# Patient Record
Sex: Male | Born: 1955 | Race: Black or African American | Hispanic: No | Marital: Single | Smoking: Current every day smoker
Health system: Southern US, Community
[De-identification: ages and names within clinical notes are randomized; demographics above are authoritative.]

## PROBLEM LIST (undated history)

## (undated) DIAGNOSIS — R918 Other nonspecific abnormal finding of lung field: Secondary | ICD-10-CM

## (undated) DIAGNOSIS — Z923 Personal history of irradiation: Secondary | ICD-10-CM

## (undated) DIAGNOSIS — K259 Gastric ulcer, unspecified as acute or chronic, without hemorrhage or perforation: Secondary | ICD-10-CM

## (undated) DIAGNOSIS — F1910 Other psychoactive substance abuse, uncomplicated: Secondary | ICD-10-CM

## (undated) DIAGNOSIS — C799 Secondary malignant neoplasm of unspecified site: Secondary | ICD-10-CM

## (undated) HISTORY — DX: Secondary malignant neoplasm of unspecified site: C79.9

## (undated) HISTORY — DX: Personal history of irradiation: Z92.3

---

## 2003-09-23 ENCOUNTER — Inpatient Hospital Stay (HOSPITAL_COMMUNITY): Admission: AD | Admit: 2003-09-23 | Discharge: 2003-09-27 | Payer: Self-pay

## 2004-06-16 ENCOUNTER — Emergency Department (HOSPITAL_COMMUNITY): Admission: EM | Admit: 2004-06-16 | Discharge: 2004-06-16 | Payer: Self-pay | Admitting: Family Medicine

## 2007-06-12 ENCOUNTER — Inpatient Hospital Stay (HOSPITAL_COMMUNITY): Admission: EM | Admit: 2007-06-12 | Discharge: 2007-06-13 | Payer: Self-pay | Admitting: Emergency Medicine

## 2007-06-12 ENCOUNTER — Encounter: Payer: Self-pay | Admitting: Emergency Medicine

## 2007-07-01 ENCOUNTER — Emergency Department (HOSPITAL_COMMUNITY): Admission: EM | Admit: 2007-07-01 | Discharge: 2007-07-01 | Payer: Self-pay | Admitting: Emergency Medicine

## 2009-12-04 ENCOUNTER — Emergency Department (HOSPITAL_COMMUNITY): Admission: EM | Admit: 2009-12-04 | Discharge: 2009-12-04 | Payer: Self-pay | Admitting: Emergency Medicine

## 2009-12-24 ENCOUNTER — Emergency Department (HOSPITAL_COMMUNITY): Admission: EM | Admit: 2009-12-24 | Discharge: 2009-12-24 | Payer: Self-pay | Admitting: Emergency Medicine
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
## 2011-04-27 NOTE — H&P (Signed)
   NAME:  [name], [name]                              ACCOUNT NO.:  [id number]   MEDICAL RECORD NO.:  [id number]                   PATIENT TYPE:  INP   LOCATION:  5703                                 FACILITY:  [facility]   PHYSICIAN:  Thornton Park. Daphine Deutscher, M.D.             DATE OF BIRTH:  October 25, 1956   DATE OF ADMISSION:  09/22/2003  DATE OF DISCHARGE:                                HISTORY & PHYSICAL   CHIEF COMPLAINT:  Severe abdominal pain.   HISTORY:  Philip Andrews is a 55 year old black male who presented to the  emergency department with an acute abdomen.  His CT scan showed free air and  antral thickening.  He was seen by me in the emergency department at  approximately midnight and was taken to the operating room for laparotomy.  This procedure was deemed an emergency, although I discussed this with his  daughter.  Informed consent had to be waived because he was intoxicated.   ALLERGIES:  The patient had no known allergies.   MEDICATIONS:  The patient took no medications.   PAST MEDICAL HISTORY:  No one accompanied the patient for past history,  family history, review of systems.   PHYSICAL EXAMINATION:  VITAL SIGNS:  Blood pressure 88 systolic.  He was  afebrile.  GENERAL:  He was a thin black male with an acute abdomen who had been given  some narcotics for pain.  HEENT:  Unremarkable.  NECK:  Supple and thin.  CHEST:  Breath sounds are equal bilaterally.  HEART:  Sinus rhythm.  ABDOMEN:  Thin and exquisitely tender.  GENITOURINARY:  Unremarkable.  EXTREMITIES:  Full range of motion.   IMPRESSION:  Perforated peptic ulcer.   PLAN:  To OR for laparotomy.                                                Thornton Park Daphine Deutscher, M.D.    MBM/MEDQ  D:  09/27/2003  T:  09/27/2003  Job:  [id number]

## 2011-04-27 NOTE — Discharge Summary (Signed)
   NAME:  LATHAM, KINZLER                              ACCOUNT NO.:  [id number]   MEDICAL RECORD NO.:  [id number]                   PATIENT TYPE:  INP   LOCATION:  5703                                 FACILITY:  [facility]   PHYSICIAN:  Thornton Park. Daphine Deutscher, M.D.             DATE OF BIRTH:  January 19, 1956   DATE OF ADMISSION:  09/22/2003  DATE OF DISCHARGE:  09/27/2003                                 DISCHARGE SUMMARY   ADMITTING DIAGNOSES:  Perforated peptic ulcer.   PROCEDURE:  Exploratory laparotomy, Cheree Ditto patch closure perforated channel  ulcer.   HOSPITAL COURSE:  Singleton Hickox was admitted through the ED, taken straight to  the OR where he underwent Cheree Ditto patch closure of perforated pyloric  channel/duodenal ulcer.  Postoperatively he did well.  He continued to sneak  downstairs and smoke despite trying to put a Nicoderm patch on him.  By  postoperative day three he was sneaking downstairs, eating regular foods,  and so on.  Postoperative day four we felt he was probably ready to go.  He  was eager to go.  His incision looked fine.  He was given a prescription for  a Prev-Pak for empiric treatment of H. pylori and a prescription for Vicodin  for pain.   CONDITION ON DISCHARGE:  Improved.  He is instructed to return to the office  in about three weeks for follow-up.   FINAL DIAGNOSES:  Perforated duodenal ulcer status post repair.                                                Thornton Park Daphine Deutscher, M.D.    MBM/MEDQ  D:  09/27/2003  T:  09/27/2003  Job:  [id number]

## 2011-04-27 NOTE — Op Note (Signed)
   NAME:  Philip Andrews, Philip Andrews                              ACCOUNT NO.:  [id number]   MEDICAL RECORD NO.:  [id number]                   PATIENT TYPE:  INP   LOCATION:  1828                                 FACILITY:  [facility]   PHYSICIAN:  Thornton Park. Daphine Deutscher, M.D.             DATE OF BIRTH:  11/25/1956   DATE OF PROCEDURE:  09/23/2003  DATE OF DISCHARGE:                                 OPERATIVE REPORT   PREOPERATIVE DIAGNOSIS:  Free air perforated ulcer.   POSTOPERATIVE DIAGNOSIS:  Free air perforated ulcer.   PROCEDURE:  Exploratory lap, Graham patch closure, perforated channel ulcer.   SURGEON:  Thornton Park. Daphine Deutscher, M.D.   ANESTHESIA:  General endotracheal.   DESCRIPTION OF PROCEDURE:  Philip Andrews is a 55 year old gentleman who  presented to the ER with an acute abdomen.  CT scan showed free air and the  extravasation of some contrast.   The patient was taken to room 16 and given general anesthesia.  The abdomen  was prepped with Betadine and draped sterilely.  A small midline incision  was made right over what appeared to be probably a pyloric channel ulcer.  This was oversewn with four sutures of 2-0 silk and approximated to close  the hole.  I then used the sutures to tie down an omental tongue which lay  up on top of the perforation, and this was tied down.  The abdomen was then  irrigated with several liters of saline.  The irrigant was clear.  The  fascia was closed with a running #1 PDS from above and below.  The wound was  irrigated and the skin was closed with a stapler.   The patient tolerated the procedure well and was taken to the recovery room  in satisfactory condition.                                               Thornton Park Daphine Deutscher, M.D.    MBM/MEDQ  D:  09/23/2003  T:  09/23/2003  Job:  [id number]
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
## 2011-09-25 LAB — DIFFERENTIAL
Band Neutrophils: 0
Basophils Absolute: 0
Basophils Relative: 0
Eosinophils Absolute: 0
Eosinophils Relative: 0
Metamyelocytes Relative: 0
Myelocytes: 0

## 2011-09-25 LAB — URINE MICROSCOPIC-ADD ON

## 2011-09-25 LAB — BASIC METABOLIC PANEL
BUN: 8
BUN: 12
Chloride: 104
Chloride: 107
Glucose, Bld: 89
Potassium: 4
Potassium: 3.5

## 2011-09-25 LAB — CBC
HCT: 39.5
HCT: 39.7
Hemoglobin: 13.9
MCV: 91.4
MCV: 92.8
Platelets: 297
Platelets: 294
RDW: 13.7
WBC: 9.6
WBC: 11.1 — ABNORMAL HIGH

## 2011-09-25 LAB — URINALYSIS, ROUTINE W REFLEX MICROSCOPIC
Nitrite: NEGATIVE
Protein, ur: 100 — AB
Specific Gravity, Urine: >1.03 — ABNORMAL HIGH
Urobilinogen, UA: 0.2

## 2011-09-25 LAB — ETHANOL: Alcohol, Ethyl (B): 149 — ABNORMAL HIGH

## 2011-09-25 LAB — RAPID URINE DRUG SCREEN, HOSP PERFORMED: Tetrahydrocannabinol: NOT DETECTED

## 2013-10-24 ENCOUNTER — Inpatient Hospital Stay (HOSPITAL_COMMUNITY)
Admission: EM | Admit: 2013-10-24 | Discharge: 2013-11-03 | DRG: 237 | Disposition: A | Discharge status: Home / Self Care | Payer: Medicaid Other | Attending: Internal Medicine | Admitting: Internal Medicine

## 2013-10-24 ENCOUNTER — Encounter (HOSPITAL_COMMUNITY)
Admission: EM | Disposition: A | Discharge status: Home / Self Care | Payer: Self-pay | Source: Home / Self Care | Attending: Internal Medicine

## 2013-10-24 ENCOUNTER — Emergency Department (HOSPITAL_COMMUNITY): Payer: Medicaid Other

## 2013-10-24 ENCOUNTER — Encounter (HOSPITAL_COMMUNITY): Payer: Self-pay | Admitting: Emergency Medicine

## 2013-10-24 DIAGNOSIS — G92 Toxic encephalopathy: Secondary | ICD-10-CM | POA: Diagnosis not present

## 2013-10-24 DIAGNOSIS — K7200 Acute and subacute hepatic failure without coma: Secondary | ICD-10-CM | POA: Diagnosis not present

## 2013-10-24 DIAGNOSIS — F1410 Cocaine abuse, uncomplicated: Secondary | ICD-10-CM | POA: Diagnosis present

## 2013-10-24 DIAGNOSIS — F329 Major depressive disorder, single episode, unspecified: Secondary | ICD-10-CM | POA: Diagnosis present

## 2013-10-24 DIAGNOSIS — I319 Disease of pericardium, unspecified: Secondary | ICD-10-CM

## 2013-10-24 DIAGNOSIS — F17200 Nicotine dependence, unspecified, uncomplicated: Secondary | ICD-10-CM | POA: Diagnosis present

## 2013-10-24 DIAGNOSIS — I079 Rheumatic tricuspid valve disease, unspecified: Secondary | ICD-10-CM | POA: Diagnosis present

## 2013-10-24 DIAGNOSIS — R579 Shock, unspecified: Secondary | ICD-10-CM | POA: Diagnosis present

## 2013-10-24 DIAGNOSIS — Z6379 Other stressful life events affecting family and household: Secondary | ICD-10-CM

## 2013-10-24 DIAGNOSIS — J439 Emphysema, unspecified: Secondary | ICD-10-CM

## 2013-10-24 DIAGNOSIS — J9601 Acute respiratory failure with hypoxia: Secondary | ICD-10-CM | POA: Diagnosis present

## 2013-10-24 DIAGNOSIS — G929 Unspecified toxic encephalopathy: Secondary | ICD-10-CM | POA: Diagnosis not present

## 2013-10-24 DIAGNOSIS — J438 Other emphysema: Secondary | ICD-10-CM | POA: Diagnosis present

## 2013-10-24 DIAGNOSIS — Z79899 Other long term (current) drug therapy: Secondary | ICD-10-CM

## 2013-10-24 DIAGNOSIS — F05 Delirium due to known physiological condition: Secondary | ICD-10-CM | POA: Diagnosis not present

## 2013-10-24 DIAGNOSIS — R64 Cachexia: Secondary | ICD-10-CM | POA: Diagnosis present

## 2013-10-24 DIAGNOSIS — R51 Headache: Secondary | ICD-10-CM

## 2013-10-24 DIAGNOSIS — I313 Pericardial effusion (noninflammatory): Secondary | ICD-10-CM

## 2013-10-24 DIAGNOSIS — J9690 Respiratory failure, unspecified, unspecified whether with hypoxia or hypercapnia: Secondary | ICD-10-CM

## 2013-10-24 DIAGNOSIS — R918 Other nonspecific abnormal finding of lung field: Secondary | ICD-10-CM | POA: Diagnosis present

## 2013-10-24 DIAGNOSIS — Z8249 Family history of ischemic heart disease and other diseases of the circulatory system: Secondary | ICD-10-CM

## 2013-10-24 DIAGNOSIS — R4181 Age-related cognitive decline: Secondary | ICD-10-CM | POA: Diagnosis present

## 2013-10-24 DIAGNOSIS — R570 Cardiogenic shock: Secondary | ICD-10-CM

## 2013-10-24 DIAGNOSIS — N179 Acute kidney failure, unspecified: Secondary | ICD-10-CM

## 2013-10-24 DIAGNOSIS — C3490 Malignant neoplasm of unspecified part of unspecified bronchus or lung: Secondary | ICD-10-CM | POA: Diagnosis present

## 2013-10-24 DIAGNOSIS — Z800 Family history of malignant neoplasm of digestive organs: Secondary | ICD-10-CM

## 2013-10-24 DIAGNOSIS — J9600 Acute respiratory failure, unspecified whether with hypoxia or hypercapnia: Secondary | ICD-10-CM | POA: Diagnosis present

## 2013-10-24 DIAGNOSIS — Z87820 Personal history of traumatic brain injury: Secondary | ICD-10-CM

## 2013-10-24 DIAGNOSIS — I3139 Other pericardial effusion (noninflammatory): Secondary | ICD-10-CM

## 2013-10-24 DIAGNOSIS — J189 Pneumonia, unspecified organism: Secondary | ICD-10-CM | POA: Diagnosis present

## 2013-10-24 DIAGNOSIS — I318 Other specified diseases of pericardium: Principal | ICD-10-CM | POA: Diagnosis present

## 2013-10-24 DIAGNOSIS — F3289 Other specified depressive episodes: Secondary | ICD-10-CM | POA: Diagnosis present

## 2013-10-24 DIAGNOSIS — R7401 Elevation of levels of liver transaminase levels: Secondary | ICD-10-CM

## 2013-10-24 DIAGNOSIS — J90 Pleural effusion, not elsewhere classified: Secondary | ICD-10-CM | POA: Diagnosis present

## 2013-10-24 DIAGNOSIS — Z8673 Personal history of transient ischemic attack (TIA), and cerebral infarction without residual deficits: Secondary | ICD-10-CM

## 2013-10-24 DIAGNOSIS — D638 Anemia in other chronic diseases classified elsewhere: Secondary | ICD-10-CM | POA: Diagnosis present

## 2013-10-24 DIAGNOSIS — C3400 Malignant neoplasm of unspecified main bronchus: Secondary | ICD-10-CM | POA: Diagnosis present

## 2013-10-24 DIAGNOSIS — I314 Cardiac tamponade: Secondary | ICD-10-CM

## 2013-10-24 DIAGNOSIS — C50919 Malignant neoplasm of unspecified site of unspecified female breast: Secondary | ICD-10-CM | POA: Diagnosis present

## 2013-10-24 HISTORY — DX: Other psychoactive substance abuse, uncomplicated: F19.10

## 2013-10-24 LAB — APTT
aPTT: 67 seconds — ABNORMAL HIGH (ref 24–37)
aPTT: 45 seconds — ABNORMAL HIGH (ref 24–37)

## 2013-10-24 LAB — POCT I-STAT, CHEM 8
Calcium, Ion: 0.6 mmol/L — CL (ref 1.12–1.23)
Calcium, Ion: 0.97 mmol/L — ABNORMAL LOW (ref 1.12–1.23)
Chloride: 98 mEq/L (ref 96–112)
Creatinine, Ser: 1.8 mg/dL — ABNORMAL HIGH (ref 0.50–1.35)
Glucose, Bld: 51 mg/dL — ABNORMAL LOW (ref 70–99)
HCT: 38 % — ABNORMAL LOW (ref 39.0–52.0)
Hemoglobin: 6.1 g/dL — CL (ref 13.0–17.0)
Hemoglobin: 12.9 g/dL — ABNORMAL LOW (ref 13.0–17.0)
Sodium: 114 mEq/L — CL (ref 135–145)
Sodium: 129 mEq/L — ABNORMAL LOW (ref 135–145)
TCO2: 8 mmol/L (ref 0–100)
TCO2: 11 mmol/L (ref 0–100)

## 2013-10-24 LAB — COMPREHENSIVE METABOLIC PANEL
ALT: 1600 U/L — ABNORMAL HIGH (ref 0–53)
AST: 3810 U/L — ABNORMAL HIGH (ref 0–37)
Albumin: 3.1 g/dL — ABNORMAL LOW (ref 3.5–5.2)
CO2: 10 mEq/L — CL (ref 19–32)
Calcium: 8.6 mg/dL (ref 8.4–10.5)
Chloride: 87 mEq/L — ABNORMAL LOW (ref 96–112)
Creatinine, Ser: 2.97 mg/dL — ABNORMAL HIGH (ref 0.50–1.35)
GFR calc Af Amer: 25 mL/min — ABNORMAL LOW (ref >90)
GFR calc non Af Amer: 22 mL/min — ABNORMAL LOW (ref >90)
Sodium: 130 mEq/L — ABNORMAL LOW (ref 135–145)

## 2013-10-24 LAB — CBC WITH DIFFERENTIAL/PLATELET
Basophils Absolute: 0 10*3/uL (ref 0.0–0.1)
Basophils Relative: 0 % (ref 0–1)
HCT: 34.1 % — ABNORMAL LOW (ref 39.0–52.0)
Hemoglobin: 11.8 g/dL — ABNORMAL LOW (ref 13.0–17.0)
Lymphocytes Relative: 11 % — ABNORMAL LOW (ref 12–46)
MCHC: 34.6 g/dL (ref 30.0–36.0)
Monocytes Absolute: 0.6 10*3/uL (ref 0.1–1.0)
Monocytes Relative: 4 % (ref 3–12)
Neutro Abs: 11.7 10*3/uL — ABNORMAL HIGH (ref 1.7–7.7)
Platelets: 261 10*3/uL (ref 150–400)
RDW: 13.3 % (ref 11.5–15.5)
WBC: 13.8 10*3/uL — ABNORMAL HIGH (ref 4.0–10.5)

## 2013-10-24 LAB — POCT I-STAT TROPONIN I: Troponin i, poc: 0.02 ng/mL (ref 0.00–0.08)

## 2013-10-24 LAB — TROPONIN I: Troponin I: <0.3 ng/mL (ref <0.30)

## 2013-10-24 LAB — URINALYSIS, ROUTINE W REFLEX MICROSCOPIC
Bilirubin Urine: NEGATIVE
Ketones, ur: 15 mg/dL — AB
Nitrite: NEGATIVE
Protein, ur: 100 mg/dL — AB
Urobilinogen, UA: 0.2 mg/dL (ref 0.0–1.0)
pH: 5 (ref 5.0–8.0)

## 2013-10-24 LAB — TYPE AND SCREEN: Antibody Screen: NEGATIVE

## 2013-10-24 LAB — ABO/RH: ABO/RH(D): O POS

## 2013-10-24 LAB — URINE MICROSCOPIC-ADD ON

## 2013-10-24 LAB — PROTIME-INR
INR: 2.5 — ABNORMAL HIGH (ref 0.00–1.49)
Prothrombin Time: 62.7 seconds — ABNORMAL HIGH (ref 11.6–15.2)

## 2013-10-24 LAB — RAPID URINE DRUG SCREEN, HOSP PERFORMED
Benzodiazepines: POSITIVE — AB
Cocaine: POSITIVE — AB

## 2013-10-24 LAB — CBC
HCT: 17.2 % — ABNORMAL LOW (ref 39.0–52.0)
MCV: 97.7 fL (ref 78.0–100.0)
Platelets: 91 10*3/uL — ABNORMAL LOW (ref 150–400)
RBC: 1.76 MIL/uL — ABNORMAL LOW (ref 4.22–5.81)
WBC: 5 10*3/uL (ref 4.0–10.5)

## 2013-10-24 LAB — CG4 I-STAT (LACTIC ACID): Lactic Acid, Venous: 14.52 mmol/L — ABNORMAL HIGH (ref 0.5–2.2)

## 2013-10-24 LAB — MRSA PCR SCREENING: MRSA by PCR: NEGATIVE

## 2013-10-24 SURGERY — PERICARDIAL TAP
Anesthesia: LOCAL

## 2013-10-24 MED ORDER — VANCOMYCIN HCL 10 G IV SOLR
1250.0000 mg | INTRAVENOUS | Status: AC
  Administered 2013-10-24: 1250 mg via INTRAVENOUS
  Filled 2013-10-24: qty 1250

## 2013-10-24 MED ORDER — SUCCINYLCHOLINE CHLORIDE 20 MG/ML IJ SOLN
100.0000 mg | Freq: Once | INTRAMUSCULAR | Status: AC
  Administered 2013-10-24: 100 mg via INTRAVENOUS
  Filled 2013-10-24: qty 5

## 2013-10-24 MED ORDER — ETOMIDATE 2 MG/ML IV SOLN
40.0000 mg | Freq: Once | INTRAVENOUS | Status: AC
  Administered 2013-10-24: 40 mg via INTRAVENOUS

## 2013-10-24 MED ORDER — ETOMIDATE 2 MG/ML IV SOLN: 0.3000 mg/kg | Freq: Once | INTRAVENOUS | Status: DC

## 2013-10-24 MED ORDER — SODIUM CHLORIDE 0.9 % IV SOLN
0.0000 ug/h | INTRAVENOUS | Status: DC
  Administered 2013-10-24 – 2013-10-25 (×2): 20 ug/h via INTRAVENOUS
  Filled 2013-10-24 (×3): qty 50

## 2013-10-24 MED ORDER — IPRATROPIUM BROMIDE HFA 17 MCG/ACT IN AERS
6.0000 | INHALATION_SPRAY | RESPIRATORY_TRACT | Status: DC
  Administered 2013-10-24 – 2013-10-26 (×10): 6 via RESPIRATORY_TRACT
  Filled 2013-10-24: qty 12.9

## 2013-10-24 MED ORDER — ALBUTEROL SULFATE (5 MG/ML) 0.5% IN NEBU: 2.5000 mg | INHALATION_SOLUTION | RESPIRATORY_TRACT | Status: DC

## 2013-10-24 MED ORDER — SODIUM CHLORIDE 0.9 % IV SOLN
2.0000 mg/h | INTRAVENOUS | Status: DC
  Filled 2013-10-24: qty 10

## 2013-10-24 MED ORDER — MIDAZOLAM HCL 2 MG/2ML IJ SOLN
INTRAMUSCULAR | Status: AC
  Administered 2013-10-24: 4 mg
  Filled 2013-10-24: qty 2

## 2013-10-24 MED ORDER — DEXTROSE-NACL 5-0.9 % IV SOLN: INTRAVENOUS | Status: DC

## 2013-10-24 MED ORDER — PANTOPRAZOLE SODIUM 40 MG IV SOLR
40.0000 mg | INTRAVENOUS | Status: DC
  Administered 2013-10-24 – 2013-10-26 (×3): 40 mg via INTRAVENOUS
  Filled 2013-10-24 (×5): qty 40

## 2013-10-24 MED ORDER — PIPERACILLIN-TAZOBACTAM IN DEX 2-0.25 GM/50ML IV SOLN
2.2500 g | Freq: Three times a day (TID) | INTRAVENOUS | Status: DC
  Administered 2013-10-25: 2.25 g via INTRAVENOUS
  Filled 2013-10-24 (×2): qty 50

## 2013-10-24 MED ORDER — SODIUM CHLORIDE 0.9 % IV SOLN
20.0000 ug/h | INTRAVENOUS | Status: DC
  Filled 2013-10-24: qty 50

## 2013-10-24 MED ORDER — FENTANYL BOLUS VIA INFUSION
50.0000 ug | INTRAVENOUS | Status: DC | PRN
  Filled 2013-10-24: qty 100

## 2013-10-24 MED ORDER — ALBUTEROL SULFATE HFA 108 (90 BASE) MCG/ACT IN AERS
6.0000 | INHALATION_SPRAY | RESPIRATORY_TRACT | Status: DC
  Administered 2013-10-24 – 2013-10-26 (×9): 6 via RESPIRATORY_TRACT
  Filled 2013-10-24 (×3): qty 6.7

## 2013-10-24 MED ORDER — PIPERACILLIN-TAZOBACTAM 3.375 G IVPB 30 MIN: 3.3750 g | INTRAVENOUS | Status: DC

## 2013-10-24 MED ORDER — FENTANYL CITRATE 0.05 MG/ML IJ SOLN: 50.0000 ug | Freq: Once | INTRAMUSCULAR | Status: DC

## 2013-10-24 MED ORDER — IOHEXOL 350 MG/ML SOLN: 80.0000 mL | Freq: Once | INTRAVENOUS | Status: DC | PRN

## 2013-10-24 MED ORDER — SODIUM CHLORIDE 0.9 % IV SOLN
INTRAVENOUS | Status: DC
  Administered 2013-10-25: 02:00:00 via INTRAVENOUS
  Administered 2013-10-25: 125 mL/h via INTRAVENOUS
  Administered 2013-10-25 – 2013-10-26 (×3): via INTRAVENOUS

## 2013-10-24 MED ORDER — IPRATROPIUM BROMIDE 0.02 % IN SOLN: 0.5000 mg | RESPIRATORY_TRACT | Status: DC

## 2013-10-24 MED ORDER — MIDAZOLAM HCL 2 MG/2ML IJ SOLN
INTRAMUSCULAR | Status: AC
  Filled 2013-10-24: qty 2

## 2013-10-24 MED ORDER — SODIUM CHLORIDE 0.9 % IV SOLN
1.0000 mg/h | INTRAVENOUS | Status: DC
  Administered 2013-10-24: 2 mg/h via INTRAVENOUS
  Administered 2013-10-24: 6 mg/h via INTRAVENOUS
  Administered 2013-10-25: 5 mg/h via INTRAVENOUS
  Administered 2013-10-26: 1 mg/h via INTRAVENOUS
  Filled 2013-10-24 (×4): qty 10

## 2013-10-24 MED ORDER — SODIUM CHLORIDE 0.9 % IV SOLN: 250.0000 mL | INTRAVENOUS | Status: DC | PRN

## 2013-10-24 MED ORDER — FENTANYL CITRATE 0.05 MG/ML IJ SOLN
INTRAMUSCULAR | Status: AC
  Administered 2013-10-24: 50 ug
  Filled 2013-10-24: qty 2

## 2013-10-24 NOTE — Consult Note (Addendum)
Name: Philip Andrews is a 57 y.o. male Admit date: 10/24/2013 Referring Physician:  [facility] Emergency Department  Primary Physician:  None Primary Cardiologist:  None  Reason for Consultation:  Shock and Tamponade  ASSESSMENT: 1. Massive pericardial effusion (demonstrated by portable ultrasound device) with pericardial tamponade and associated shock and pulmonary edema 2. Bloody pericardial effusion 3. Cachexia and weight loss of undetermined cause  PLAN:  1. Emergency pericardiocentesis and pericardial drain placement using electrocardiographic guidance in the emergency room due to refractory shock felt secondary to paracardial tamponade.  2. The last critical care to admit the patient, as the bloody pericardial effusion is a clinical manifestation of some other underlying clinical problem such as cancer, aortic dissection, etc.  3. Will require broad-spectrum antibiotics as a drain was placed in emergency circumstances.  4. Serial cardiac markers  5. Stat 2-D Doppler echocardiogram  6. Chest CT and abdominal CT without contrast to look for obvious evidence of tumor and also to check the placement of the pericardial drain   HPI: 57 year old gentleman brought by EMS to the emergency room billed as "STEMI". I evaluated the patient upon his arrival in the emergency room and he was complaining of both abdominal and chest discomfort. The abdominal discomfort of been going on for several days. The chest discomfort started this morning at 9 AM. He is also complaining of dyspnea and weakness. He was striking in in reviewing the patient that his neck veins were markedly distended. After getting the patient into aerobic to examine him we noted that his blood pressure was 70 and IV fluids were begun. A bedside ultrasound demonstrated a large pericardial effusion. Despite IV fluid the blood pressures remained very low and the patient required intubation. After drainage of approximately 900 cc of  bloody fluid the patient's heart rate and blood pressure significantly increased.  PMH:  History reviewed. No pertinent past medical history.  PSH:  History reviewed. No pertinent past surgical history. Allergies:  Review of patient's allergies indicates no known allergies. Prior to Admit Meds:   (Not in a hospital admission) Fam HX:   History reviewed. No pertinent family history. Social HX:    History   Social History  . Marital Status: Single    Spouse Name: N/A    Number of Children: N/A  . Years of Education: N/A   Occupational History  . Not on file.   Social History Main Topics  . Smoking status: Never Smoker   . Smokeless tobacco: Not on file  . Alcohol Use: Not on file  . Drug Use: Not on file  . Sexual Activity: Not on file   Other Topics Concern  . Not on file   Social History Narrative  . No narrative on file     Review of Systems: Not able to obtain any useful review of systems  Physical Exam: Blood pressure 126/61, pulse 132, resp. rate 25. Weight change:   Frail and chronically ill appearing man with poor dentition Strutted external jugular neck veins bilaterally Cardiac exam reveals relatively silent/muffled heart sounds. Abdomen, particularly the right upper quadrant is tender. Bowel sounds are significantly diminished. Extremities are cold and pulses are difficult to palpate. Faint signals are noted with Doppler Very lethargic sensorium Labs: Lab Results  Component Value Date   WBC 5.0 10/24/2013   HGB 12.9* 10/24/2013   HCT 38.0* 10/24/2013   MCV 97.7 10/24/2013   PLT PENDING 10/24/2013    Recent Labs Lab 10/24/13 1540  NA 129*  K 4.9  CL 98  BUN 91*  CREATININE 3.30*  GLUCOSE 51*   No results found for this basename: PTT   Lab Results  Component Value Date   INR 7.86* 10/24/2013   INR 1.1 06/13/2007     Radiology:  Dg Chest Portable 1 View  10/24/2013   CLINICAL DATA:  Intubation.  Line placement.  EXAM: PORTABLE CHEST - 1  VIEW  COMPARISON:  07/03/2007  FINDINGS: Endotracheal tube is in place, tip approximately 4.3 cm above chronic. A catheter has an inferior approach, overlying the heart, possibly representing pericardial catheter.  The heart is enlarged. There is dense opacification in the retrocardiac region on the left, consistent with infiltrate or atelectasis. There is perihilar edema.  IMPRESSION: 1. Interval placement of endotracheal tube and probable pericardial catheter. 2. Cardiomegaly and pulmonary edema. 3. Left lower lobe infiltrate or atelectasis.   Electronically Signed   By: Rosalie Gums M.D.   On: 10/24/2013 15:35   EKG:  Ectopic atrial tachycardia with mild generalized ST elevation more prominent in the inferior leads  Continuous critical care services were provided for the patient for 70 minutes one on one at the bedside.  Lesleigh Noe 10/24/2013 3:48 PM

## 2013-10-24 NOTE — ED Notes (Signed)
Pt transported to 2H by receiving RN, Luisa Hart RT, and Jeannett Senior NT

## 2013-10-24 NOTE — ED Notes (Signed)
Dr Katrinka Blazing  And Dr Bernette Mayers at bedside at bridge on arrival to ED.

## 2013-10-24 NOTE — ED Notes (Addendum)
Patient presents to ED via EMS with c/o chest pain and abdominal pain. EMS gave 324 aspirin, 4mg  IV morphine.
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
## 2013-10-24 NOTE — H&P (Signed)
PULMONARY  / CRITICAL CARE MEDICINE  Name: Philip Andrews MRN: [id number] DOB: 07/26/1956    ADMISSION DATE:  10/24/2013  REFERRING MD :  EDP  PRIMARY SERVICE: PCCM   CHIEF COMPLAINT:  Cardiac tamponade  BRIEF PATIENT DESCRIPTION:  57 yo male with known hx of polysubstance abuse admitted to ER with chest pain and hypotension found to have large pericardial effusion with cardiac tamponade requiring emergent pericardiocentesis w/ drain in ER . PCCM asked to admit   SIGNIFICANT EVENTS / STUDIES:  11/15 Pericardiocentesis w/ pericardial drain placement   LINES / TUBES: 11/15 Pericardial Drain >> 11/15 ETT >>  CULTURES: 11/15 BC x 2 >>  ANTIBIOTICS: 11/15 Vanc >> 11/15 Zosyn >>   HISTORY OF PRESENT ILLNESS:   57 year old gentleman brought by EMS to the emergency room for possible STEMI with chest pain/EKG changes Patient upon his arrival in the emergency room and he was complaining of both abdominal and chest discomfort. Found to have severe JVD. Bedside US showed large pericardial effusion w/ tamponade . Hypotensive in ER.   Pt underwent a bedside pericardiocentesis . W/ 1.4 L of bloody fluid removed. B/p improved after procedure.  A pericardial drain was placed.  PCCM asked to admit      PAST MEDICAL HISTORY :  History reviewed. No pertinent past medical history. History reviewed. No pertinent past surgical history. Prior to Admission medications   Not on File   No Known Allergies  FAMILY HISTORY:  History reviewed. No pertinent family history. SOCIAL HISTORY:  reports that he has never smoked. He does not have any smokeless tobacco history on file. His alcohol and drug histories are not on file.  REVIEW OF SYSTEMS:  Unable to obtain as sedated on vent   SUBJECTIVE:   VITAL SIGNS: Temp:  [92.7 F (33.7 C)-92.8 F (33.8 C)] 92.7 F (33.7 C) (11/15 1630) Pulse Rate:  [94-132] 94 (11/15 1630) Resp:  [13-30] 13 (11/15 1630) BP: (49-162)/(18-121) 131/67 mmHg (11/15  1630) SpO2:  [95 %-98 %] 98 % (11/15 1630) FiO2 (%):  [100 %] 100 % (11/15 1450) Weight:  [120 lb (54.432 kg)] 120 lb (54.432 kg) (11/15 1530) HEMODYNAMICS:   VENTILATOR SETTINGS: Vent Mode:  [-] PRVC FiO2 (%):  [100 %] 100 % Set Rate:  [16 bmp] 16 bmp Vt Set:  [570 mL] 570 mL PEEP:  [5 cmH20] 5 cmH20 Plateau Pressure:  [15 cmH20] 15 cmH20 INTAKE / OUTPUT: Intake/Output     11/14 0701 - 11/15 0700 11/15 0701 - 11/16 0700   Urine (mL/kg/hr)  125   Total Output   125   Net   -125          PHYSICAL EXAMINATION: GEN : frail and chronically ill appearing man  HEENT : poor dentition , ETT NECK : JVD LUNG : Diminished BS   Cardiac : decreased sounds  Abdomen, soft, hypoactive BS  Extremities: cool .  Neuro : sedated on vent   LABS:  CBC  Recent Labs Lab 10/24/13 1452 10/24/13 1506 10/24/13 1540  WBC 5.0  --   --   HGB 5.7* 6.1* 12.9*  HCT 17.2* 18.0* 38.0*  PLT 91*  --   --    Coag's  Recent Labs Lab 10/24/13 1452  APTT 67*  INR 7.86*   BMET  Recent Labs Lab 10/24/13 1506 10/24/13 1540  NA 114* 129*  K 3.9 4.9  CL 88* 98  BUN 68* 91*  CREATININE 1.80* 3.30*  GLUCOSE 28* 51*  Electrolytes No results found for this basename: CALCIUM, MG, PHOS,  in the last 168 hours Sepsis Markers  Recent Labs Lab 10/24/13 1655  LATICACIDVEN 14.52*   ABG No results found for this basename: PHART, PCO2ART, PO2ART,  in the last 168 hours Liver Enzymes No results found for this basename: AST, ALT, ALKPHOS, BILITOT, ALBUMIN,  in the last 168 hours Cardiac Enzymes  Recent Labs Lab 10/24/13 1549  TROPONINI <0.30   Glucose No results found for this basename: GLUCAP,  in the last 168 hours  Imaging Ct Abdomen Pelvis Wo Contrast  10/24/2013   CLINICAL DATA:  Shock.  Pericardial tamponade.  EXAM: CT ABDOMEN AND PELVIS WITHOUT CONTRAST  TECHNIQUE: Multidetector CT imaging of the abdomen and pelvis was performed following the standard protocol without  intravenous contrast.  COMPARISON:  None.  FINDINGS: The patient has very low body fat and the lack of intravenous and oral contrast markedly limits the diagnostic utility of the scan.  The stomach is distended with fluid. There is a small amount of what appears to be contrast in the cecum. No dilated loops of large or small bowel. No free air in the abdomen. Suggestion of a small amount of free fluid in the pelvis.  Foley catheter in place.  Pericardial drain in place.  No focal liver lesions. Spleen and pancreas appear normal. Adrenal glands are not enlarged. No appreciable renal lesion.  Fairly extensive calcification in the abdominal aorta and iliac arteries. No osseous abnormality.  IMPRESSION: No visible acute abnormality of the abdomen other than a small amount of free fluid in the pelvis. The stomach is distended with fluid. I cannot visualize the gallbladder.   Electronically Signed   By: Geanie Cooley M.D.   On: 10/24/2013 16:27   Ct Chest Wo Contrast  10/24/2013   CLINICAL DATA:  Pericardial tamponade.  Shock.  Pulmonary edema.  EXAM: CT CHEST WITHOUT CONTRAST  TECHNIQUE: Multidetector CT imaging of the chest was performed following the standard protocol without IV contrast.  COMPARISON:  Chest x-ray dated 10/24/2013  FINDINGS: The patient has extensive emphysema in the upper lobes. There is a poorly defined 18 x 17 x 13 mm mass at the superior aspect of the left hilum. There is fullness at the inferior aspect of the left hilum which may represent a mass is well but the detail is poor because of the lack of body fat and lack of intravenous contrast. There are small to moderate bilateral pleural effusions. There is atelectasis and partial consolidation in the left lower lobe and atelectasis in the right lower lobe.  There is a catheter in the pericardium with only a small amount of residual pericardial effusion.  Endotracheal tube appears in good position. No acute osseous abnormality.  IMPRESSION: 1.  Mass lesion at the superior aspect of the left hilum and possible mass lesion at the inferior aspect of the left hilum. 2. Small to moderate bilateral pleural effusions. Atelectasis and consolidation in the left lower lobe. 3. Small residual pericardial effusion. Pericardial drain in place. Extensive emphysema.   Electronically Signed   By: Geanie Cooley M.D.   On: 10/24/2013 16:24   Dg Chest Portable 1 View  10/24/2013   CLINICAL DATA:  Intubation.  Line placement.  EXAM: PORTABLE CHEST - 1 VIEW  COMPARISON:  07/03/2007  FINDINGS: Endotracheal tube is in place, tip approximately 4.3 cm above chronic. A catheter has an inferior approach, overlying the heart, possibly representing pericardial catheter.  The heart is enlarged.  There is dense opacification in the retrocardiac region on the left, consistent with infiltrate or atelectasis. There is perihilar edema.  IMPRESSION: 1. Interval placement of endotracheal tube and probable pericardial catheter. 2. Cardiomegaly and pulmonary edema. 3. Left lower lobe infiltrate or atelectasis.   Electronically Signed   By: Rosalie Gums M.D.   On: 10/24/2013 15:35     CXR: Interval placement of endotracheal tube and probable pericardial  catheter.  2. Cardiomegaly and pulmonary edema.  3. Left lower lobe infiltrate or atelectasis.   CT chest 11/15 >Mass lesion at the superior aspect of the left hilum and possible  mass lesion at the inferior aspect of the left hilum.  2. Small to moderate bilateral pleural effusions. Atelectasis and  consolidation in the left lower lobe.  3. Small residual pericardial effusion. Pericardial drain in place.  Extensive emphysema.   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   GASTROINTESTINAL A:   P:   PPI /SUP   HEMATOLOGIC A:  Anemia  P:  Monitor   INFECTIOUS A:  S/p bedside pericardial drain in ER 11/15  P:   Empiric abx w/ vanc Laqueta Jean   ENDOCRINE A:   P:   Monitor   NEUROLOGIC A:  Polysubstance abuse  P:   Check tox screen ICU sedation      PARRETT,TAMMY NP-C   TODAY'S SUMMARY: Tamponade from presumed malignant effusion - await fluid cytology, If neg may need further diagnostics for lung cancer  I have personally obtained a history, examined the patient, evaluated laboratory and imaging results, formulated the assessment and plan and placed orders. CRITICAL CARE: The patient is critically ill with multiple organ systems failure and requires high complexity decision making for assessment and support, frequent evaluation and titration of therapies, application of advanced monitoring technologies and extensive interpretation of multiple databases. Critical Care Time devoted to patient care services described in this note is  60 minutes.  Oretha Milch  Pulmonary and Critical Care Medicine [facility] Pager: [phone]  10/24/2013, 5:10 PM

## 2013-10-24 NOTE — ED Notes (Signed)
Pt to CT

## 2013-10-24 NOTE — ED Notes (Signed)
Yellow colored bracelet removed and given to pt's son; Philip Andrews

## 2013-10-24 NOTE — ED Provider Notes (Signed)
CSN: [id number]     Arrival date & time 10/24/13  1429 History   First MD Initiated Contact with Patient 10/24/13 1515     Chief Complaint  Patient presents with  . Chest Pain   (Consider location/radiation/quality/duration/timing/severity/associated sxs/prior Treatment) Patient is a 57 y.o. male presenting with chest pain.  Chest Pain  Level 5 caveat due to urgent need for intervention Pt brought to the ED via EMS as a code STEMI. Reports he has had diffuse upper abdominal pain for several days but began to have chest pains this afternoon. Has had diffuse weakness today, no known fever. No vomiting.   History reviewed. No pertinent past medical history. History reviewed. No pertinent past surgical history. History reviewed. No pertinent family history. History  Substance Use Topics  . Smoking status: Never Smoker   . Smokeless tobacco: Not on file  . Alcohol Use: Not on file    Review of Systems  Cardiovascular: Positive for chest pain.   Unable to assess due to urgent need for intervention  Allergies  Review of patient's allergies indicates no known allergies.  Home Medications  No current outpatient prescriptions on file. BP 126/61  Pulse 132  Resp 25 Physical Exam  Nursing note and vitals reviewed. Constitutional: He is oriented to person, place, and time. He appears well-developed.  thin  HENT:  Head: Normocephalic and atraumatic.  Eyes: EOM are normal. Pupils are equal, round, and reactive to light.  Neck: Normal range of motion. Neck supple.  Cardiovascular:  Tachycardic, muffled heart sounds, +JVD, difficulty finding pedal pulses but has femoral pulses  Pulmonary/Chest: Effort normal and breath sounds normal.  Abdominal: He exhibits no distension and no mass (no pulsatile masses). There is tenderness (epigastric). There is guarding.  Musculoskeletal: Normal range of motion. He exhibits no edema and no tenderness.  Neurological: He is alert and oriented to  person, place, and time. He has normal strength. No cranial nerve deficit or sensory deficit.  Skin: Skin is warm and dry. No rash noted.  Psychiatric: He has a normal mood and affect.    ED Course  INTUBATION Date/Time: 10/24/2013 3:39 PM Performed by: Susy Frizzle B. Authorized by: Pollyann Savoy Consent: Verbal consent obtained. Consent given by: patient Time out: Immediately prior to procedure a "time out" was called to verify the correct patient, procedure, equipment, support staff and site/side marked as required. Indications: airway protection Intubation method: video-assisted Patient status: paralyzed (RSI) Preoxygenation: nonrebreather mask and BVM Sedatives: etomidate Paralytic: succinylcholine Laryngoscope size: Miller 4 Tube size: 7.5 mm Tube type: cuffed Number of attempts: 2 (pt gagging first attempt) Ventilation between attempts: BVM Cricoid pressure: no Cords visualized: yes Post-procedure assessment: chest rise and ETCO2 monitor Breath sounds: equal Cuff inflated: yes ETT to lip: 21 cm Tube secured with: ETT holder Chest x-ray interpreted by me. Chest x-ray findings: endotracheal tube in appropriate position Patient tolerance: Patient tolerated the procedure well with no immediate complications.   (including critical care time)  CRITICAL CARE Performed by: Pollyann Savoy. Total critical care time: 75 Critical care time was exclusive of separately billable procedures and treating other patients. Critical care was necessary to treat or prevent imminent or life-threatening deterioration. Critical care was time spent personally by me on the following activities: development of treatment plan with patient and/or surrogate as well as nursing, discussions with consultants, evaluation of patient's response to treatment, examination of patient, obtaining history from patient or surrogate, ordering and performing treatments and interventions, ordering and  review  of laboratory studies, ordering and review of radiographic studies, pulse oximetry and re-evaluation of patient's condition.   Labs Review Labs Reviewed  CBC - Abnormal; Notable for the following:    RBC 1.76 (*)    Hemoglobin 5.7 (*)    HCT 17.2 (*)    All other components within normal limits  POCT I-STAT, CHEM 8 - Abnormal; Notable for the following:    Sodium 114 (*)    Chloride 88 (*)    BUN 68 (*)    Creatinine, Ser 1.80 (*)    Glucose, Bld 28 (*)    Calcium, Ion 0.60 (*)    Hemoglobin 6.1 (*)    HCT 18.0 (*)    All other components within normal limits  COMPREHENSIVE METABOLIC PANEL  PROTIME-INR  APTT  URINALYSIS, ROUTINE W REFLEX MICROSCOPIC  POCT I-STAT TROPONIN I  TYPE AND SCREEN   Imaging Review Dg Chest Portable 1 View  10/24/2013   CLINICAL DATA:  Intubation.  Line placement.  EXAM: PORTABLE CHEST - 1 VIEW  COMPARISON:  07/03/2007  FINDINGS: Endotracheal tube is in place, tip approximately 4.3 cm above chronic. A catheter has an inferior approach, overlying the heart, possibly representing pericardial catheter.  The heart is enlarged. There is dense opacification in the retrocardiac region on the left, consistent with infiltrate or atelectasis. There is perihilar edema.  IMPRESSION: 1. Interval placement of endotracheal tube and probable pericardial catheter. 2. Cardiomegaly and pulmonary edema. 3. Left lower lobe infiltrate or atelectasis.   Electronically Signed   By: Rosalie Gums M.D.   On: 10/24/2013 15:35    EKG Interpretation     Ventricular Rate:  149 PR Interval:  80 QRS Duration: 90 QT Interval:  326 QTC Calculation: 513 R Axis:   84 Text Interpretation:  Sinus tachycardia Inferior infarct, acute (LCx) Borderline ST elevation, anterior leads Lateral leads are also involved ?pericarditis Prolonged QT interval Since last tracing ST changes are new            MDM   1. Cardiac tamponade   2. Respiratory failure     Pt with increasing  tachycardia and decreased BP on arrival. Dr. Verdis Prime with Cardiology at bedside on patient arrival. Limited bedside ultrasound with Dr. Katrinka Blazing shows large pericardial effusion, with concern for tamponade. Pt intubated for airway protection as above. BP still low and so Dr. Katrinka Blazing has done pericardiocentesis at bedside. Initial labs suspected to be dilutional. Repeat I-STAT shows more accurate electrolytes/Hgb however creatinine also much higher. Cannot get angiogram, although dissection felt to be unlikely. Concern for possible occult neoplasm. Will send for CT C/A/P without contrast. Case discussed with Dr. Felipa Eth on call for PCCM who will also be involved with the patient.      Charles B. Bernette Mayers, MD 10/24/13 [phone]

## 2013-10-24 NOTE — ED Notes (Signed)
Dr Katrinka Blazing and Dr Bernette Mayers doing cardiac ultrasound at bedside. Preparing for pericardiocentesis

## 2013-10-24 NOTE — ED Notes (Addendum)
pericardial drain completed by DR Katrinka Blazing.  Philip Andrews cath Clinical biochemist .

## 2013-10-24 NOTE — ED Notes (Signed)
Echo tech at bedside doing echo.

## 2013-10-24 NOTE — Progress Notes (Signed)
ANTIBIOTIC CONSULT NOTE - INITIAL  Pharmacy Consult for Vancomycin, Zosyn Indication: Surgical prophylaxis for pericardial drain placement  No Known Allergies Patient Measurements: Weight 54.4 kg (approximated in ED) Vital Signs: BP: 126/61 mmHg (11/15 1530) Pulse Rate: 132 (11/15 1509) Labs:  Recent Labs  10/24/13 1452 10/24/13 1506 10/24/13 1540  WBC 5.0  --   --   HGB 5.7* 6.1* 12.9*  PLT 91*  --   --   CREATININE  --  1.80* 3.30*   CrCl is unknown because there is no height on file for the current visit.  Microbiology: No results found for this or any previous visit (from the past 720 hour(s)). Medical History: History reviewed. No pertinent past medical history.  Assessment: 36 YOM with pericardial tamponade s/p pericardial drain placement in the ED to start broad antibiotics with vancomycin and Zosyn. SCr elevated at 3.30/estCrCl~18 mL/min.   Goal of Therapy:  Vancomycin trough level 15-20 mcg/ml  Plan:  1. Vancomycin 1250mg  IV x1 now, then follow-up renal function in AM for further dosing.  2. Zosyn 3.375g IV now over , then 2.25g IV q8h.  3. Please confirm weight.   Link Snuffer, PharmD, BCPS Clinical Pharmacist [phone] 10/24/2013,4:20 PM

## 2013-10-24 NOTE — Progress Notes (Signed)
  Echocardiogram 2D Echocardiogram has been performed.  Philip Andrews Philip Andrews 10/24/2013, 5:24 PM

## 2013-10-24 NOTE — ED Notes (Signed)
NOTIFIED DR. Loretha Stapler IN PERSON OF LAB RESULTS OF LACTIC ACID 14.41mmoI/L @17 :00 PM ,10/24/2013.

## 2013-10-25 ENCOUNTER — Inpatient Hospital Stay (HOSPITAL_COMMUNITY): Payer: Medicaid Other

## 2013-10-25 DIAGNOSIS — I369 Nonrheumatic tricuspid valve disorder, unspecified: Secondary | ICD-10-CM

## 2013-10-25 DIAGNOSIS — N179 Acute kidney failure, unspecified: Secondary | ICD-10-CM

## 2013-10-25 DIAGNOSIS — R222 Localized swelling, mass and lump, trunk: Secondary | ICD-10-CM

## 2013-10-25 LAB — BLOOD GAS, ARTERIAL
Bicarbonate: 24.6 mEq/L — ABNORMAL HIGH (ref 20.0–24.0)
Drawn by: 23604
PEEP: 5 cmH2O
Patient temperature: 99.4
RATE: 16 resp/min
pCO2 arterial: 45.1 mmHg — ABNORMAL HIGH (ref 35.0–45.0)
pH, Arterial: 7.358 (ref 7.350–7.450)
pO2, Arterial: 60.5 mmHg — ABNORMAL LOW (ref 80.0–100.0)

## 2013-10-25 LAB — BASIC METABOLIC PANEL
BUN: 83 mg/dL — ABNORMAL HIGH (ref 6–23)
CO2: 24 mEq/L (ref 19–32)
Calcium: 7.4 mg/dL — ABNORMAL LOW (ref 8.4–10.5)
GFR calc non Af Amer: 34 mL/min — ABNORMAL LOW (ref >90)
Glucose, Bld: 103 mg/dL — ABNORMAL HIGH (ref 70–99)

## 2013-10-25 LAB — BODY FLUID CELL COUNT WITH DIFFERENTIAL
Lymphs, Fluid: 12 %
Neutrophil Count, Fluid: 85 % — ABNORMAL HIGH (ref 0–25)

## 2013-10-25 LAB — CBC
HCT: 30.7 % — ABNORMAL LOW (ref 39.0–52.0)
Hemoglobin: 10.7 g/dL — ABNORMAL LOW (ref 13.0–17.0)
MCH: 31.3 pg (ref 26.0–34.0)
MCHC: 34.9 g/dL (ref 30.0–36.0)
MCV: 89.8 fL (ref 78.0–100.0)
RBC: 3.42 MIL/uL — ABNORMAL LOW (ref 4.22–5.81)
WBC: 12.2 10*3/uL — ABNORMAL HIGH (ref 4.0–10.5)

## 2013-10-25 LAB — TROPONIN I
Troponin I: <0.3 ng/mL (ref <0.30)
Troponin I: <0.3 ng/mL (ref <0.30)

## 2013-10-25 LAB — GLUCOSE, CAPILLARY: Glucose-Capillary: 99 mg/dL (ref 70–99)

## 2013-10-25 MED ORDER — PIPERACILLIN-TAZOBACTAM 3.375 G IVPB
3.3750 g | Freq: Three times a day (TID) | INTRAVENOUS | Status: DC
  Administered 2013-10-25 – 2013-10-27 (×7): 3.375 g via INTRAVENOUS
  Filled 2013-10-25 (×9): qty 50

## 2013-10-25 MED ORDER — SODIUM CHLORIDE 0.9 % IV BOLUS (SEPSIS)
500.0000 mL | Freq: Once | INTRAVENOUS | Status: AC
  Administered 2013-10-25: 500 mL via INTRAVENOUS

## 2013-10-25 MED ORDER — BIOTENE DRY MOUTH MT LIQD
15.0000 mL | Freq: Four times a day (QID) | OROMUCOSAL | Status: DC
  Administered 2013-10-25 – 2013-10-27 (×7): 15 mL via OROMUCOSAL

## 2013-10-25 MED ORDER — CHLORHEXIDINE GLUCONATE 0.12 % MT SOLN
15.0000 mL | Freq: Two times a day (BID) | OROMUCOSAL | Status: DC
  Administered 2013-10-25 – 2013-10-26 (×4): 15 mL via OROMUCOSAL
  Filled 2013-10-25 (×4): qty 15

## 2013-10-25 MED ORDER — VANCOMYCIN HCL IN DEXTROSE 1-5 GM/200ML-% IV SOLN
1000.0000 mg | INTRAVENOUS | Status: DC
  Administered 2013-10-25: 1000 mg via INTRAVENOUS
  Filled 2013-10-25 (×2): qty 200

## 2013-10-25 NOTE — Progress Notes (Signed)
Name: Philip Andrews is a 57 y.o. male Admit date: 10/24/2013 Referring Physician:  [facility] Emergency Department  Primary Physician:  None Primary Cardiologist:  None  Reason for Consultation:  Shock and Tamponade   Brief HPI: 57 year old man arriving at [facility] ED via EMS for  "STEMI" but found to have both abdominal and chest discomfort upon his arrival. The abdominal discomfort of been going on for several days. The chest discomfort started the morning of his presentation. He also had and weakness with neck veins markedly distended. He became hypotensive and a bedside ultrasound demonstrated a large pericardial effusion. He was emergently intubated with emergent pericardiocentesis with 900 ml of bloody fluid. After this procedure his heart rate and blood pressure significantly improved.       PMH:   Past Medical History  Diagnosis Date  . Substance abuse     per H&P    PSH:  History reviewed. No pertinent past surgical history. Allergies:  Review of patient's allergies indicates no known allergies. Prior to Admit Meds:   No prescriptions prior to admission   Fam HX:   History reviewed. No pertinent family history. Social HX:    History   Social History  . Marital Status: Single    Spouse Name: N/A    Number of Children: N/A  . Years of Education: N/A   Occupational History  . Not on file.   Social History Main Topics  . Smoking status: Current Every Day Smoker -- 1.00 packs/day for 40 years    Types: Cigarettes  . Smokeless tobacco: Not on file  . Alcohol Use: 16.8 oz/week    28 Cans of beer per week  . Drug Use: Not on file  . Sexual Activity: Not on file   Other Topics Concern  . Not on file   Social History Narrative  . No narrative on file     Review of Systems: Not able to obtain any useful review of systems  Physical Exam: Blood pressure 90/48, pulse 83, temperature 97.9 F (36.6 C), temperature source Core (Comment), resp. rate 16, height 5\' 9"   (1.753 m), weight 140 lb 6.9 oz (63.7 kg), SpO2 100.00%. Weight change:   Frail and chronically ill appearing man with poor dentition Strutted external jugular neck veins bilaterally Cardiac exam reveals relatively silent/muffled heart sounds. Abdomen, particularly the right upper quadrant is tender. Bowel sounds are significantly diminished. Extremities are cold and pulses are difficult to palpate. Faint signals are noted with Doppler Very lethargic sensorium Labs: Lab Results  Component Value Date   WBC 12.2* 10/25/2013   HGB 10.7* 10/25/2013   HCT 30.7* 10/25/2013   MCV 89.8 10/25/2013   PLT 293 10/25/2013     Recent Labs Lab 10/24/13 1627 10/25/13 0430  NA 130* 132*  K 4.7 4.8  CL 87* 98  CO2 10* 24  BUN 85* 83*  CREATININE 2.97* 2.04*  CALCIUM 8.6 7.4*  PROT 6.4  --   BILITOT 1.0  --   ALKPHOS 122*  --   ALT 1600*  --   AST 3810*  --   GLUCOSE 71 103*   No results found for this basename: PTT   Lab Results  Component Value Date   INR 2.50* 10/24/2013   INR 7.86* 10/24/2013   INR 1.1 06/13/2007     Radiology:  Ct Abdomen Pelvis Wo Contrast  10/24/2013   CLINICAL DATA:  Shock.  Pericardial tamponade.  EXAM: CT ABDOMEN AND PELVIS WITHOUT CONTRAST  TECHNIQUE: Multidetector CT imaging of the abdomen and pelvis was performed following the standard protocol without intravenous contrast.  COMPARISON:  None.  FINDINGS: The patient has very low body fat and the lack of intravenous and oral contrast markedly limits the diagnostic utility of the scan.  The stomach is distended with fluid. There is a small amount of what appears to be contrast in the cecum. No dilated loops of large or small bowel. No free air in the abdomen. Suggestion of a small amount of free fluid in the pelvis.  Foley catheter in place.  Pericardial drain in place.  No focal liver lesions. Spleen and pancreas appear normal. Adrenal glands are not enlarged. No appreciable renal lesion.  Fairly extensive  calcification in the abdominal aorta and iliac arteries. No osseous abnormality.  IMPRESSION: No visible acute abnormality of the abdomen other than a small amount of free fluid in the pelvis. The stomach is distended with fluid. I cannot visualize the gallbladder.   Electronically Signed   By: Geanie Cooley M.D.   On: 10/24/2013 16:27   Ct Chest Wo Contrast  10/24/2013   CLINICAL DATA:  Pericardial tamponade.  Shock.  Pulmonary edema.  EXAM: CT CHEST WITHOUT CONTRAST  TECHNIQUE: Multidetector CT imaging of the chest was performed following the standard protocol without IV contrast.  COMPARISON:  Chest x-ray dated 10/24/2013  FINDINGS: The patient has extensive emphysema in the upper lobes. There is a poorly defined 18 x 17 x 13 mm mass at the superior aspect of the left hilum. There is fullness at the inferior aspect of the left hilum which may represent a mass is well but the detail is poor because of the lack of body fat and lack of intravenous contrast. There are small to moderate bilateral pleural effusions. There is atelectasis and partial consolidation in the left lower lobe and atelectasis in the right lower lobe.  There is a catheter in the pericardium with only a small amount of residual pericardial effusion.  Endotracheal tube appears in good position. No acute osseous abnormality.  IMPRESSION: 1. Mass lesion at the superior aspect of the left hilum and possible mass lesion at the inferior aspect of the left hilum. 2. Small to moderate bilateral pleural effusions. Atelectasis and consolidation in the left lower lobe. 3. Small residual pericardial effusion. Pericardial drain in place. Extensive emphysema.   Electronically Signed   By: Geanie Cooley M.D.   On: 10/24/2013 16:24   Dg Chest Port 1 View  10/25/2013   CLINICAL DATA:  Intubated patient  EXAM: PORTABLE CHEST - 1 VIEW  COMPARISON:  Chest radiograph and chest CT, 10/24/2013  FINDINGS: Endotracheal tube is stable in well positioned, tip lying  3.7 cm above the Carina. Nasogastric tube passes below the diaphragm into the stomach.  Lungs are hyperexpanded reflecting COPD. Left lung base opacity is a combination of atelectasis and pleural fluid, stable. No pulmonary edema. No new lung opacities.  IMPRESSION: 1. Stable support apparatus. 2. Left pleural effusion with associated atelectasis, also stable. Underlying COPD. No new lung opacities.   Electronically Signed   By: Amie Portland M.D.   On: 10/25/2013 08:16   Dg Chest Portable 1 View  10/24/2013   CLINICAL DATA:  Intubation.  Line placement.  EXAM: PORTABLE CHEST - 1 VIEW  COMPARISON:  07/03/2007  FINDINGS: Endotracheal tube is in place, tip approximately 4.3 cm above chronic. A catheter has an inferior approach, overlying the heart, possibly representing pericardial catheter.  The heart  is enlarged. There is dense opacification in the retrocardiac region on the left, consistent with infiltrate or atelectasis. There is perihilar edema.  IMPRESSION: 1. Interval placement of endotracheal tube and probable pericardial catheter. 2. Cardiomegaly and pulmonary edema. 3. Left lower lobe infiltrate or atelectasis.   Electronically Signed   By: Rosalie Gums M.D.   On: 10/24/2013 15:35   EKG:  Ectopic atrial tachycardia with mild generalized ST elevation more prominent in the inferior leads    ASSESSMENT: 1. Massive pericardial effusion (demonstrated by portable ultrasound device) with pericardial tamponade and associated shock and pulmonary edema 2. Bloody pericardial effusion 3. Cachexia and weight loss of undetermined cause  PLAN:  1. Emergency pericardiocentesis and pericardial drain placement using electrocardiographic guidance in the emergency room due to refractory shock felt secondary to paracardial tamponade.  2. Possible underlying lung cancer based on CT  3. Will require broad-spectrum antibiotics as a drain was placed in emergency circumstances.  4. Serial cardiac markers  5.  Echo to follow pericardial effusion s/p drain.    Sara Chu D 10/25/2013 9:08 AM  Patient seen with resident, agree with the above note.    1. Pericardial tamponade: s/p pericardiocentesis.  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  Will need to hook the drain up to a suction bulb this morning.  If no further drainage, could remove tomorrow.  Depending on prognosis, down the road may need pericardial window. 2. Hypotension: SBP 90s, still has some JVD.  Will get echo this morning to rule out significant residual effusion.  May be low BP in the setting of sedation for vent.  Also with low grade fever, had abdominal pain yesterday.  Covering widely with antibiotics in case of sepsis component.  3. Elevated LFTs: suspect shock liver in setting of hypotension with pericardial tamponade yesterday.  4. AKI: Improving this morning post-pericardiocentesis.   5. ID: Broad coverage for infection.  ? Abdominal source but CT abdomen without contrast did not show a definite problem.   Marca Ancona 10/25/2013 9:30 AM

## 2013-10-25 NOTE — Progress Notes (Signed)
PULMONARY  / CRITICAL CARE MEDICINE  Name: Philip Andrews MRN: [id number] DOB: [birth year]    ADMISSION DATE:  10/24/2013  REFERRING MD :  EDP  PRIMARY SERVICE: PCCM   CHIEF COMPLAINT:  Cardiac tamponade  BRIEF PATIENT DESCRIPTION:  57 yo male with known hx of polysubstance abuse admitted to ER with chest pain and hypotension found to have large pericardial effusion with cardiac tamponade requiring emergent pericardiocentesis w/ drain in ER-1.4 L bloody fluid removed . PCCM asked to admit . UDS POs cocaine  SIGNIFICANT EVENTS / STUDIES:  11/15 Pericardiocentesis w/ pericardial drain placement   LINES / TUBES: 11/15 Pericardial Drain >> 11/15 ETT >>  CULTURES: 11/15 BC x 2 >>  ANTIBIOTICS: 11/15 Vanc >> 11/15 Zosyn >>       SUBJECTIVE: sedated, febrile overnight  VITAL SIGNS: Temp:  [92.7 F (33.7 C)-100.6 F (38.1 C)] 97.9 F (36.6 C) (11/16 0700) Pulse Rate:  [81-132] 87 (11/16 0700) Resp:  [13-30] 16 (11/16 0600) BP: (49-162)/(18-121) 90/51 mmHg (11/16 0600) SpO2:  [95 %-100 %] 100 % (11/16 0600) FiO2 (%):  [40 %-100 %] 50 % (11/16 0700) Weight:  [54.432 kg (120 lb)-64.2 kg (141 lb 8.6 oz)] 63.7 kg (140 lb 6.9 oz) (11/16 0500) HEMODYNAMICS:   VENTILATOR SETTINGS: Vent Mode:  [-] PRVC FiO2 (%):  [40 %-100 %] 50 % Set Rate:  [16 bmp] 16 bmp Vt Set:  [570 mL] 570 mL PEEP:  [5 cmH20] 5 cmH20 Plateau Pressure:  [15 cmH20-18 cmH20] 16 cmH20 INTAKE / OUTPUT: Intake/Output     11/15 0701 - 11/16 0700 11/16 0701 - 11/17 0700   I.V. (mL/kg) 823.8 (12.9)    IV Piggyback 300    Total Intake(mL/kg) 1123.8 (17.6)    Urine (mL/kg/hr) 1095    Emesis/NG output 1150    Total Output 2245     Net -1121.2          Stool Occurrence     Emesis Occurrence       PHYSICAL EXAMINATION: GEN : frail and chronically ill appearing man  HEENT : poor dentition , ETT NECK : JVD LUNG : Diminished BS   Cardiac : decreased sounds , drain to suction - bloody fluid Abdomen, soft,  hypoactive BS  Extremities: cool .  Neuro : sedated on vent   LABS:  CBC  Recent Labs Lab 10/24/13 1452  10/24/13 1540 10/24/13 1627 10/25/13 0430  WBC 5.0  --   --  13.8* 12.2*  HGB 5.7*  < > 12.9* 11.8* 10.7*  HCT 17.2*  < > 38.0* 34.1* 30.7*  PLT 91*  --   --  261 293  < > = values in this interval not displayed. Coag's  Recent Labs Lab 10/24/13 1452 10/24/13 1627  APTT 67* 45*  INR 7.86* 2.50*   BMET  Recent Labs Lab 10/24/13 1540 10/24/13 1627 10/25/13 0430  NA 129* 130* 132*  K 4.9 4.7 4.8  CL 98 87* 98  CO2  --  10* 24  BUN 91* 85* 83*  CREATININE 3.30* 2.97* 2.04*  GLUCOSE 51* 71 103*   Electrolytes  Recent Labs Lab 10/24/13 1627 10/25/13 0430  CALCIUM 8.6 7.4*   Sepsis Markers  Recent Labs Lab 10/24/13 1655 10/24/13 1900  LATICACIDVEN 14.52* 10.0*   ABG  Recent Labs Lab 10/25/13 0500  PHART 7.358  PCO2ART 45.1*  PO2ART 60.5*   Liver Enzymes  Recent Labs Lab 10/24/13 1627  AST 3810*  ALT 1600*  ALKPHOS 122*  BILITOT 1.0  ALBUMIN 3.1*   Cardiac Enzymes  Recent Labs Lab 10/24/13 1549 10/24/13 2323 10/25/13 0349  TROPONINI <0.30 <0.30 <0.30   Glucose  Recent Labs Lab 10/25/13 0356  GLUCAP 99    Imaging Ct Abdomen Pelvis Wo Contrast  10/24/2013   CLINICAL DATA:  Shock.  Pericardial tamponade.  EXAM: CT ABDOMEN AND PELVIS WITHOUT CONTRAST  TECHNIQUE: Multidetector CT imaging of the abdomen and pelvis was performed following the standard protocol without intravenous contrast.  COMPARISON:  None.  FINDINGS: The patient has very low body fat and the lack of intravenous and oral contrast markedly limits the diagnostic utility of the scan.  The stomach is distended with fluid. There is a small amount of what appears to be contrast in the cecum. No dilated loops of large or small bowel. No free air in the abdomen. Suggestion of a small amount of free fluid in the pelvis.  Foley catheter in place.  Pericardial drain in  place.  No focal liver lesions. Spleen and pancreas appear normal. Adrenal glands are not enlarged. No appreciable renal lesion.  Fairly extensive calcification in the abdominal aorta and iliac arteries. No osseous abnormality.  IMPRESSION: No visible acute abnormality of the abdomen other than a small amount of free fluid in the pelvis. The stomach is distended with fluid. I cannot visualize the gallbladder.   Electronically Signed   By: Geanie Cooley M.D.   On: 10/24/2013 16:27   Ct Chest Wo Contrast  10/24/2013   CLINICAL DATA:  Pericardial tamponade.  Shock.  Pulmonary edema.  EXAM: CT CHEST WITHOUT CONTRAST  TECHNIQUE: Multidetector CT imaging of the chest was performed following the standard protocol without IV contrast.  COMPARISON:  Chest x-ray dated 10/24/2013  FINDINGS: The patient has extensive emphysema in the upper lobes. There is a poorly defined 18 x 17 x 13 mm mass at the superior aspect of the left hilum. There is fullness at the inferior aspect of the left hilum which may represent a mass is well but the detail is poor because of the lack of body fat and lack of intravenous contrast. There are small to moderate bilateral pleural effusions. There is atelectasis and partial consolidation in the left lower lobe and atelectasis in the right lower lobe.  There is a catheter in the pericardium with only a small amount of residual pericardial effusion.  Endotracheal tube appears in good position. No acute osseous abnormality.  IMPRESSION: 1. Mass lesion at the superior aspect of the left hilum and possible mass lesion at the inferior aspect of the left hilum. 2. Small to moderate bilateral pleural effusions. Atelectasis and consolidation in the left lower lobe. 3. Small residual pericardial effusion. Pericardial drain in place. Extensive emphysema.   Electronically Signed   By: Geanie Cooley M.D.   On: 10/24/2013 16:24   Dg Chest Port 1 View  10/25/2013   CLINICAL DATA:  Intubated patient  EXAM:  PORTABLE CHEST - 1 VIEW  COMPARISON:  Chest radiograph and chest CT, 10/24/2013  FINDINGS: Endotracheal tube is stable in well positioned, tip lying 3.7 cm above the Carina. Nasogastric tube passes below the diaphragm into the stomach.  Lungs are hyperexpanded reflecting COPD. Left lung base opacity is a combination of atelectasis and pleural fluid, stable. No pulmonary edema. No new lung opacities.  IMPRESSION: 1. Stable support apparatus. 2. Left pleural effusion with associated atelectasis, also stable. Underlying COPD. No new lung opacities.   Electronically Signed   By: Onalee Hua  Ormond M.D.   On: 10/25/2013 08:16   Dg Chest Portable 1 View  10/24/2013   CLINICAL DATA:  Intubation.  Line placement.  EXAM: PORTABLE CHEST - 1 VIEW  COMPARISON:  07/03/2007  FINDINGS: Endotracheal tube is in place, tip approximately 4.3 cm above chronic. A catheter has an inferior approach, overlying the heart, possibly representing pericardial catheter.  The heart is enlarged. There is dense opacification in the retrocardiac region on the left, consistent with infiltrate or atelectasis. There is perihilar edema.  IMPRESSION: 1. Interval placement of endotracheal tube and probable pericardial catheter. 2. Cardiomegaly and pulmonary edema. 3. Left lower lobe infiltrate or atelectasis.   Electronically Signed   By: Rosalie Gums M.D.   On: 10/24/2013 15:35     CXR: Interval placement of endotracheal tube and probable pericardial  catheter.  2. Cardiomegaly and pulmonary edema.  3. Left lower lobe infiltrate or atelectasis.   CT chest 11/15 >Mass lesion at the superior aspect of the left hilum and possible  mass lesion at the inferior aspect of the left hilum.  2. Small to moderate bilateral pleural effusions. Atelectasis and consolidation in the left lower lobe.  3. Small residual pericardial effusion. Pericardial drain in place. Extensive emphysema.   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   GASTROINTESTINAL A:   P:   PPI /SUP   HEMATOLOGIC A:  Anemia  P:  Monitor   INFECTIOUS A:  S/p bedside pericardial drain in ER 11/15  P:   Empiric abx w/ vanc Laqueta Jean   ENDOCRINE A:   P:   Monitor   NEUROLOGIC A:  Polysubstance abuse  P:   Fent gtt with int versed once drain out    TODAY'S SUMMARY: Tamponade from presumed malignant effusion - await fluid cytology, If neg may need further diagnostics for lung cancer  I have personally obtained a history, examined the patient, evaluated laboratory and imaging results, formulated the assessment and plan and placed orders. CRITICAL CARE: The patient is critically ill with multiple organ systems failure and requires high complexity decision making for assessment and support, frequent evaluation and titration of therapies, application of advanced monitoring technologies and extensive interpretation of multiple databases. Critical Care Time devoted to patient care services described in this note is  45 minutes.  Oretha Milch  Pulmonary and Critical Care Medicine [facility] Pager: [phone]  10/25/2013, 8:30 AM

## 2013-10-25 NOTE — Progress Notes (Signed)
eLink Physician-Brief Progress Note Patient Name: ISIAAH CUERVO DOB: 12-20-1955 MRN: [id number]  Date of Service  10/25/2013   HPI/Events of Note   RN needs order clarification > fluids AKI Needs CXR ordered  eICU Interventions  Check glucose x1  D/c d5 Start NS 125 cc/hr CXR ordered ETT placement      MCQUAID, DOUGLAS 10/25/2013, 2:00 AM

## 2013-10-25 NOTE — Progress Notes (Signed)
  Echocardiogram 2D Echocardiogram limited has been performed.  Philip Andrews 10/25/2013, 10:03 AM

## 2013-10-26 ENCOUNTER — Inpatient Hospital Stay (HOSPITAL_COMMUNITY): Payer: Medicaid Other

## 2013-10-26 DIAGNOSIS — J9600 Acute respiratory failure, unspecified whether with hypoxia or hypercapnia: Secondary | ICD-10-CM

## 2013-10-26 DIAGNOSIS — I319 Disease of pericardium, unspecified: Secondary | ICD-10-CM

## 2013-10-26 DIAGNOSIS — J438 Other emphysema: Secondary | ICD-10-CM

## 2013-10-26 LAB — CBC
Hemoglobin: 10.4 g/dL — ABNORMAL LOW (ref 13.0–17.0)
MCH: 31.2 pg (ref 26.0–34.0)
MCHC: 34.1 g/dL (ref 30.0–36.0)
MCV: 91.6 fL (ref 78.0–100.0)
Platelets: 255 10*3/uL (ref 150–400)
RDW: 13.5 % (ref 11.5–15.5)

## 2013-10-26 LAB — COMPREHENSIVE METABOLIC PANEL
ALT: 1987 U/L — ABNORMAL HIGH (ref 0–53)
Albumin: 2.5 g/dL — ABNORMAL LOW (ref 3.5–5.2)
CO2: 25 mEq/L (ref 19–32)
Calcium: 7.6 mg/dL — ABNORMAL LOW (ref 8.4–10.5)
Creatinine, Ser: 0.96 mg/dL (ref 0.50–1.35)
GFR calc Af Amer: >90 mL/min (ref >90)
GFR calc non Af Amer: 90 mL/min — ABNORMAL LOW (ref >90)
Glucose, Bld: 81 mg/dL (ref 70–99)
Sodium: 138 mEq/L (ref 135–145)
Total Protein: 5.4 g/dL — ABNORMAL LOW (ref 6.0–8.3)

## 2013-10-26 LAB — GLUCOSE, CAPILLARY
Glucose-Capillary: 69 mg/dL — ABNORMAL LOW (ref 70–99)
Glucose-Capillary: 71 mg/dL (ref 70–99)

## 2013-10-26 LAB — VANCOMYCIN, RANDOM: Vancomycin Rm: 5 ug/mL

## 2013-10-26 MED ORDER — DEXMEDETOMIDINE HCL IN NACL 200 MCG/50ML IV SOLN
0.0000 ug/kg/h | INTRAVENOUS | Status: DC
  Administered 2013-10-26: 0.1 ug/kg/h via INTRAVENOUS
  Administered 2013-10-27: 0.3 ug/kg/h via INTRAVENOUS
  Filled 2013-10-26 (×2): qty 50

## 2013-10-26 MED ORDER — LORAZEPAM 2 MG/ML IJ SOLN
1.0000 mg | INTRAMUSCULAR | Status: DC | PRN
  Administered 2013-10-26: 1 mg via INTRAVENOUS
  Administered 2013-10-26 – 2013-10-27 (×3): 2 mg via INTRAVENOUS
  Administered 2013-10-27: 4 mg via INTRAVENOUS
  Administered 2013-10-27: 2 mg via INTRAVENOUS
  Filled 2013-10-26 (×2): qty 1
  Filled 2013-10-26: qty 2
  Filled 2013-10-26 (×3): qty 1

## 2013-10-26 MED ORDER — LORAZEPAM 2 MG/ML IJ SOLN
1.0000 mg | INTRAMUSCULAR | Status: DC | PRN
Start: 2013-10-26 — End: 2013-10-26
  Administered 2013-10-26: 1 mg via INTRAVENOUS
  Filled 2013-10-26: qty 1

## 2013-10-26 MED ORDER — VANCOMYCIN HCL IN DEXTROSE 750-5 MG/150ML-% IV SOLN
750.0000 mg | Freq: Two times a day (BID) | INTRAVENOUS | Status: DC
  Administered 2013-10-26 – 2013-10-27 (×2): 750 mg via INTRAVENOUS
  Filled 2013-10-26 (×3): qty 150

## 2013-10-26 MED ORDER — FENTANYL BOLUS VIA INFUSION
50.0000 ug | INTRAVENOUS | Status: DC | PRN
  Filled 2013-10-26: qty 100

## 2013-10-26 MED ORDER — ALBUTEROL SULFATE (5 MG/ML) 0.5% IN NEBU: 2.5000 mg | INHALATION_SOLUTION | RESPIRATORY_TRACT | Status: DC | PRN

## 2013-10-26 MED ORDER — ALBUTEROL SULFATE (5 MG/ML) 0.5% IN NEBU
2.5000 mg | INHALATION_SOLUTION | Freq: Four times a day (QID) | RESPIRATORY_TRACT | Status: DC
  Administered 2013-10-26 – 2013-10-27 (×6): 2.5 mg via RESPIRATORY_TRACT
  Filled 2013-10-26 (×6): qty 0.5

## 2013-10-26 MED ORDER — IPRATROPIUM BROMIDE 0.02 % IN SOLN
0.5000 mg | Freq: Four times a day (QID) | RESPIRATORY_TRACT | Status: DC
  Administered 2013-10-26 – 2013-10-27 (×6): 0.5 mg via RESPIRATORY_TRACT
  Filled 2013-10-26 (×6): qty 2.5

## 2013-10-26 MED ORDER — KCL IN DEXTROSE-NACL 20-5-0.45 MEQ/L-%-% IV SOLN
INTRAVENOUS | Status: DC
  Administered 2013-10-26 – 2013-10-27 (×2): via INTRAVENOUS
  Filled 2013-10-26 (×3): qty 1000

## 2013-10-26 NOTE — Progress Notes (Signed)
PULMONARY  / CRITICAL CARE MEDICINE  Name: DETRAVION TESTER MRN: [id number] DOB: January 31, 1956    ADMISSION DATE:  10/24/2013  REFERRING MD :  EDP  PRIMARY SERVICE: PCCM   CHIEF COMPLAINT:  Cardiac tamponade  BRIEF PATIENT DESCRIPTION:  58 yo male with chest pain/hypotension from cardiac tamponade.  PCCM asked to admit to ICU. 57 yo male with known hx of polysubstance abuse admitted to ER with chest pain and hypotension found to have large pericardial effusion with cardiac tamponade requiring emergent pericardiocentesis w/ drain in ER-1.4 L bloody fluid removed . PCCM asked to admit . UDS POs cocaine  SIGNIFICANT EVENTS: 11/15 Admit, VDRF, pericardiocentesis 11/17 Pericardial catheter removed  STUDIES:  11/15 Pericardiocentesis w/ pericardial drain placement >> 11/15 CT chest >> centrilobular emphysema, bullous changes, 18 mm mass LT hilum, b/l pleural effusions, ATX LLL 11/15 CT abd >> negative 11/15 Echo >> EF 55 to 60%, mild MR, PAS 47 mmHg 11/16 Echo >> EF 65 to 70%  LINES / TUBES: 11/15 Pericardial Drain >>11/17 11/15 ETT >>  CULTURES: 11/15 BC x 2 >>  ANTIBIOTICS: 11/15 Vanc >> 11/15 Zosyn >>  SUBJECTIVE:  Tolerating some pressure support.  VITAL SIGNS: Temp:  [98.2 F (36.8 C)-98.9 F (37.2 C)] 98.7 F (37.1 C) (11/17 0700) Pulse Rate:  [73-90] 80 (11/17 0850) Resp:  [16-18] 16 (11/17 0850) BP: (89-115)/(47-77) 97/51 mmHg (11/17 0700) SpO2:  [99 %-100 %] 99 % (11/17 0850) FiO2 (%):  [40 %-50 %] 40 % (11/17 0850) Weight:  [139 lb 8.8 oz (63.3 kg)] 139 lb 8.8 oz (63.3 kg) (11/17 0402) VENTILATOR SETTINGS: Vent Mode:  [-] PRVC FiO2 (%):  [40 %-50 %] 40 % Set Rate:  [16 bmp] 16 bmp Vt Set:  [570 mL] 570 mL PEEP:  [5 cmH20] 5 cmH20 Plateau Pressure:  [14 cmH20-20 cmH20] 15 cmH20 INTAKE / OUTPUT: Intake/Output     11/16 0701 - 11/17 0700 11/17 0701 - 11/18 0700   I.V. (mL/kg) 2988.3 (47.2)    Other 6    IV Piggyback 666.7    Total Intake(mL/kg) 3660.9 (57.8)     Urine (mL/kg/hr) 2305 (1.5)    Emesis/NG output 200 (0.1)    Drains 3 (0)    Total Output 2508     Net +1152.9            PHYSICAL EXAMINATION: GEN : no distress HEENT: ETT in place LUNG: Decreased BS, no wheeze   Cardiac : regular Abdomen, soft, hypoactive BS  Extremities: no edema Neuro: sleepy, wakes up easily  LABS:  CBC  Recent Labs Lab 10/24/13 1627 10/25/13 0430 10/26/13 0555  WBC 13.8* 12.2* 9.2  HGB 11.8* 10.7* 10.4*  HCT 34.1* 30.7* 30.5*  PLT 261 293 255   Coag's  Recent Labs Lab 10/24/13 1452 10/24/13 1627  APTT 67* 45*  INR 7.86* 2.50*   BMET  Recent Labs Lab 10/24/13 1627 10/25/13 0430 10/26/13 0555  NA 130* 132* 138  K 4.7 4.8 4.0  CL 87* 98 105  CO2 10* 24 25  BUN 85* 83* 36*  CREATININE 2.97* 2.04* 0.96  GLUCOSE 71 103* 81   Electrolytes  Recent Labs Lab 10/24/13 1627 10/25/13 0430 10/26/13 0555  CALCIUM 8.6 7.4* 7.6*   Sepsis Markers  Recent Labs Lab 10/24/13 1655 10/24/13 1900  LATICACIDVEN 14.52* 10.0*   ABG  Recent Labs Lab 10/25/13 0500  PHART 7.358  PCO2ART 45.1*  PO2ART 60.5*   Liver Enzymes  Recent Labs Lab 10/24/13 1627 10/26/13  0555  AST 3810* 2607*  ALT 1600* 1987*  ALKPHOS 122* 99  BILITOT 1.0 0.6  ALBUMIN 3.1* 2.5*   Cardiac Enzymes  Recent Labs Lab 10/24/13 2323 10/25/13 0349 10/25/13 0900  TROPONINI <0.30 <0.30 <0.30   Glucose  Recent Labs Lab 10/25/13 0356 10/25/13 2037 10/26/13 0734 10/26/13 0735 10/26/13 0949  GLUCAP 99 96 69* 71 73    Imaging Ct Abdomen Pelvis Wo Contrast  10/24/2013   CLINICAL DATA:  Shock.  Pericardial tamponade.  EXAM: CT ABDOMEN AND PELVIS WITHOUT CONTRAST  TECHNIQUE: Multidetector CT imaging of the abdomen and pelvis was performed following the standard protocol without intravenous contrast.  COMPARISON:  None.  FINDINGS: The patient has very low body fat and the lack of intravenous and oral contrast markedly limits the diagnostic utility  of the scan.  The stomach is distended with fluid. There is a small amount of what appears to be contrast in the cecum. No dilated loops of large or small bowel. No free air in the abdomen. Suggestion of a small amount of free fluid in the pelvis.  Foley catheter in place.  Pericardial drain in place.  No focal liver lesions. Spleen and pancreas appear normal. Adrenal glands are not enlarged. No appreciable renal lesion.  Fairly extensive calcification in the abdominal aorta and iliac arteries. No osseous abnormality.  IMPRESSION: No visible acute abnormality of the abdomen other than a small amount of free fluid in the pelvis. The stomach is distended with fluid. I cannot visualize the gallbladder.   Electronically Signed   By: Geanie Cooley M.D.   On: 10/24/2013 16:27   Ct Chest Wo Contrast  10/24/2013   CLINICAL DATA:  Pericardial tamponade.  Shock.  Pulmonary edema.  EXAM: CT CHEST WITHOUT CONTRAST  TECHNIQUE: Multidetector CT imaging of the chest was performed following the standard protocol without IV contrast.  COMPARISON:  Chest x-ray dated 10/24/2013  FINDINGS: The patient has extensive emphysema in the upper lobes. There is a poorly defined 18 x 17 x 13 mm mass at the superior aspect of the left hilum. There is fullness at the inferior aspect of the left hilum which may represent a mass is well but the detail is poor because of the lack of body fat and lack of intravenous contrast. There are small to moderate bilateral pleural effusions. There is atelectasis and partial consolidation in the left lower lobe and atelectasis in the right lower lobe.  There is a catheter in the pericardium with only a small amount of residual pericardial effusion.  Endotracheal tube appears in good position. No acute osseous abnormality.  IMPRESSION: 1. Mass lesion at the superior aspect of the left hilum and possible mass lesion at the inferior aspect of the left hilum. 2. Small to moderate bilateral pleural effusions.  Atelectasis and consolidation in the left lower lobe. 3. Small residual pericardial effusion. Pericardial drain in place. Extensive emphysema.   Electronically Signed   By: Geanie Cooley M.D.   On: 10/24/2013 16:24   Dg Chest Port 1 View  10/26/2013   CLINICAL DATA:  Respiratory difficulty  EXAM: PORTABLE CHEST - 1 VIEW  COMPARISON:  10/25/2013  FINDINGS: Tubular structures stable. Left basilar consolidation stable. Hyperaeration. No pneumothorax.  IMPRESSION: Stable.  Left basilar consolidation   Electronically Signed   By: Maryclare Bean M.D.   On: 10/26/2013 07:46   Dg Chest Port 1 View  10/25/2013   CLINICAL DATA:  Intubated patient  EXAM: PORTABLE CHEST - 1 VIEW  COMPARISON:  Chest radiograph and chest CT, 10/24/2013  FINDINGS: Endotracheal tube is stable in well positioned, tip lying 3.7 cm above the Carina. Nasogastric tube passes below the diaphragm into the stomach.  Lungs are hyperexpanded reflecting COPD. Left lung base opacity is a combination of atelectasis and pleural fluid, stable. No pulmonary edema. No new lung opacities.  IMPRESSION: 1. Stable support apparatus. 2. Left pleural effusion with associated atelectasis, also stable. Underlying COPD. No new lung opacities.   Electronically Signed   By: Amie Portland M.D.   On: 10/25/2013 08:16   Dg Chest Portable 1 View  10/24/2013   CLINICAL DATA:  Intubation.  Line placement.  EXAM: PORTABLE CHEST - 1 VIEW  COMPARISON:  07/03/2007  FINDINGS: Endotracheal tube is in place, tip approximately 4.3 cm above chronic. A catheter has an inferior approach, overlying the heart, possibly representing pericardial catheter.  The heart is enlarged. There is dense opacification in the retrocardiac region on the left, consistent with infiltrate or atelectasis. There is perihilar edema.  IMPRESSION: 1. Interval placement of endotracheal tube and probable pericardial catheter. 2. Cardiomegaly and pulmonary edema. 3. Left lower lobe infiltrate or atelectasis.    Electronically Signed   By: Rosalie Gums M.D.   On: 10/24/2013 15:35     ASSESSMENT / PLAN:  PULMONARY A:  Acute respiratory failure 2nd to cardiac tamponade. 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 Tobacco abuse. P:   -pressure support wean as tolerated -f/u CXR  -scheduled BD's -may need bronch if pericardial fluid cytology negative  CARDIOVASCULAR A:  Pericardial effusion with tamponade. P:  -monitor hemodynamics -f/u pericardial fluid cytology  RENAL A:   Acute renal failure - resolved. P:   -monitor renal fx, urine outpt, electrolytes   GASTROINTESTINAL A:   Nutrition. P:   -tube feeds if unable to extubate soon -protonix of SUP   HEMATOLOGIC A:   Anemia of chronic disease and critical illness. P:  -f/u CBC -SCD for DVT prevention  INFECTIOUS A:   S/p bedside pericardial drain in ER 11/15. Lt lower lung consolidation. P:   -Empiric abx w/ vanc /zosyn   ENDOCRINE A:   No hx of DM. P:   -monitor CBG  NEUROLOGIC A:  Polysubstance abuse - UDS positive for cocaine. P:   -limit sedation while weaning vent  CC time 35 minutes.  Coralyn Helling, MD [facility] Pulmonary/Critical Care 10/26/2013, 10:43 AM Pager:  [phone] After 3pm call: [phone]

## 2013-10-26 NOTE — Progress Notes (Signed)
ANTIBIOTIC CONSULT NOTE - Follow-UP  Pharmacy Consult for Vancomycin, Zosyn Indication: Surgical prophylaxis for pericardial drain placement, left lower lung consolidation  No Known Allergies Patient Measurements: Weight 54.4 kg (approximated in ED) Vital Signs: Temp: 99.6 F (37.6 C) (11/17 1149) Temp src: Oral (11/17 1149) BP: 110/63 mmHg (11/17 1100) Pulse Rate: 101 (11/17 1205) Labs:  Recent Labs  10/24/13 1627 10/25/13 0430 10/26/13 0555  WBC 13.8* 12.2* 9.2  HGB 11.8* 10.7* 10.4*  PLT 261 293 255  CREATININE 2.97* 2.04* 0.96   Estimated Creatinine Clearance: 75.1 ml/min (by C-G formula based on Cr of 0.96).    Microbiology: Recent Results (from the past 720 hour(s))  MRSA PCR SCREENING     Status: None   Collection Time    10/24/13  6:30 PM      Result Value Range Status   MRSA by PCR NEGATIVE  NEGATIVE Final   Comment:            The GeneXpert MRSA Assay (FDA     approved for NASAL specimens     only), is one component of a     comprehensive MRSA colonization     surveillance program. It is not     intended to diagnose MRSA     infection nor to guide or     monitor treatment for     MRSA infections.  BODY FLUID CULTURE     Status: None   Collection Time    10/25/13 12:21 PM      Result Value Range Status   Specimen Description PERICARDIAL FLUID   Final   Special Requests NONE   Final   Gram Stain     Final   Value: FEW WBC PRESENT,BOTH PMN AND MONONUCLEAR     NO ORGANISMS SEEN     Performed at [facility]   Culture PENDING   Incomplete   Report Status PENDING   Incomplete   Medical History: Past Medical History  Diagnosis Date  . Substance abuse     per H&P    Assessment: 19 YOM with pericardial tamponade s/p pericardial drain placement in the ED to started on broad antibiotics with vancomycin and Zosyn. SCr elevated at 3.30/estCrCl~18 mL/min at time of initiation.  Scr improved drastically to 0.96 today, with estimated CrCl ~ 75  ml/min.  Random vancomycin level drawn at 19 hrs after last dose = 5.  Below therapeutic dosing goal of 15-20 as expected with improved renal function. Cultures negative thus far.  Goal of Therapy:  Vancomycin trough level 15-20 mcg/ml  Plan:  1. Increase vancomycin to 750 mg IV q 12 hrs.  Will recheck level at steady state as indicated. 2. Continue Zosyn 3.375g IV q 8 hrs (infuse over 4 hrs). 3. F/U plans for length of antibiotic therapy/narrow as possible.  Tad Moore, BCPS  Clinical Pharmacist Pager [phone]  10/26/2013 12:51 PM

## 2013-10-26 NOTE — Progress Notes (Signed)
INITIAL NUTRITION ASSESSMENT  DOCUMENTATION CODES Per approved criteria  -Not Applicable   INTERVENTION: If pt unable to be extubated, recommend initiating Vital 1.2 @ 20 ml/hr via NG tube and increase by 10 ml every 4 hours to goal rate of 55 ml/hr. At goal rate, tube feeding regimen will provide 1584 kcal, 99 grams of protein, and 1069 ml of H2O.   Free water flushes: 130 ml every 6 hrs providing 520 ml of free water.  Total free water: 1589 ml per day.  Last bm: none recorded  NUTRITION DIAGNOSIS: Inadequate oral intake related to inability to eat as evidenced by NPO.   Goal: Pt to meet >/= 90% of their estimated nutrition needs   Monitor:  Intubation status, I/O's, labs, initiation of TF, wt  Reason for Assessment: New Vent  57 y.o. male  Admitting Dx: Acute respiratory failure with hypoxia  ASSESSMENT: 57 yo male with known hx of polysubstance abuse admitted to ER with chest pain and hypotension found to have large pericardial effusion with cardiac tamponade requiring emergent pericardiocentesis w/ drain in ER .  Pt currently intubated. Per RN, pt may be extubated today or tomorrow. If pt is unable to be extubated, recommend initiation of TF regimen above.   Height: Ht Readings from Last 1 Encounters:  10/24/13 5\' 9"  (1.753 m)    Weight: Wt Readings from Last 1 Encounters:  10/26/13 139 lb 8.8 oz (63.3 kg)    Ideal Body Weight: 70.7 kg  % Ideal Body Weight: 90%  Wt Readings from Last 10 Encounters:  10/26/13 139 lb 8.8 oz (63.3 kg)  10/26/13 139 lb 8.8 oz (63.3 kg)    Usual Body Weight: unknown  % Usual Body Weight: unknown  BMI:  Body mass index is 20.6 kg/(m^2).  Estimated Nutritional Needs: Kcal: 1576 Protein: 85-95 g of protein Fluid: per MD Based on: Ventilation: 6.7 L/min Tmax: 37.1 degrees Celsius  Skin: incision on chest  Diet Order: NPO  EDUCATION NEEDS: -Education not appropriate at this time   Intake/Output Summary (Last 24  hours) at 10/26/13 1139 Last data filed at 10/26/13 1100  Gross per 24 hour  Intake 3755.45 ml  Output   2908 ml  Net 847.45 ml    Last BM: none recorded   Labs:   Recent Labs Lab 10/24/13 1627 10/25/13 0430 10/26/13 0555  NA 130* 132* 138  K 4.7 4.8 4.0  CL 87* 98 105  CO2 10* 24 25  BUN 85* 83* 36*  CREATININE 2.97* 2.04* 0.96  CALCIUM 8.6 7.4* 7.6*  GLUCOSE 71 103* 81    CBG (last 3)   Recent Labs  10/26/13 0734 10/26/13 0735 10/26/13 0949  GLUCAP 69* 71 73    Scheduled Meds: . albuterol  2.5 mg Nebulization Q6H  . antiseptic oral rinse  15 mL Mouth Rinse QID  . chlorhexidine  15 mL Mouth Rinse BID  . ipratropium  0.5 mg Nebulization Q6H  . pantoprazole (PROTONIX) IV  40 mg Intravenous Q24H  . piperacillin-tazobactam (ZOSYN)  IV  3.375 g Intravenous Q8H  . vancomycin  1,000 mg Intravenous Q24H    Continuous Infusions: . dextrose 5 % and 0.45 % NaCl with KCl 20 mEq/L 75 mL/hr at 10/26/13 1139  . fentaNYL infusion INTRAVENOUS Stopped (10/26/13 1030)  . midazolam (VERSED) infusion Stopped (10/26/13 0730)    Past Medical History  Diagnosis Date  . Substance abuse     per H&P    History reviewed. No pertinent past surgical  history.  Ebbie Latus RD, LDN

## 2013-10-26 NOTE — Progress Notes (Addendum)
40cc of versed wasted in sink; 30cc of fentanyl wasted in sink; both witnessed by 2 RNs Shearon Balo, RN  Ricard Faulkner, Linnell Fulling

## 2013-10-26 NOTE — Procedures (Signed)
Extubation Procedure Note  Patient Details:   Name: Philip Andrews DOB: 1956/04/07 MRN: [id number]   Airway Documentation:     Evaluation  O2 sats: stable throughout Complications: No apparent complications Patient did tolerate procedure well. Bilateral Breath Sounds: Rhonchi Suctioning: Airway Yes  Pt extubated per MD order and placed on 4l [place]. Pt's sats 100% .  RN at bedside. RT will continue to monitor.  Closson, Terie Purser 10/26/2013, 12:10 PM

## 2013-10-26 NOTE — Progress Notes (Signed)
Subjective:  Brief HPI: 57 year old man arriving at [facility] ED via EMS for "STEMI" but found to have both abdominal and chest discomfort upon his arrival. The abdominal discomfort of been going on for several days. The chest discomfort started the morning of his presentation. He also had and weakness with neck veins markedly distended. He became hypotensive and a bedside ultrasound demonstrated a large pericardial effusion. He was emergently intubated with emergent pericardiocentesis with 900 ml of bloody fluid. After this procedure his heart rate and blood pressure significantly improved.   Currently on vent. No complaints, sedate. Notes reviewed.   Objective:  Vital Signs in the last 24 hours: Temp:  [98.2 F (36.8 C)-98.9 F (37.2 C)] 98.7 F (37.1 C) (11/17 0700) Pulse Rate:  [73-90] 80 (11/17 0850) Resp:  [16-18] 16 (11/17 0850) BP: (88-115)/(47-77) 97/51 mmHg (11/17 0700) SpO2:  [99 %-100 %] 99 % (11/17 0850) FiO2 (%):  [40 %-50 %] 40 % (11/17 0850) Weight:  [139 lb 8.8 oz (63.3 kg)] 139 lb 8.8 oz (63.3 kg) (11/17 0402)  Intake/Output from previous day: 11/16 0701 - 11/17 0700 In: 3660.9 [I.V.:2988.3; IV Piggyback:666.7] Out: 2508 [Urine:2305; Emesis/NG output:200; Drains:3]   Physical Exam: General: Ill appearing on vent, thin, in no acute distress. Head:  Normocephalic and atraumatic. No significant JVD Lungs: Clear to auscultation and percussion. Heart: Normal S1 and S2.  No murmur, rubs or gallops. Pericardial drain.  Abdomen: soft, non-tender, positive bowel sounds. Extremities: No clubbing or cyanosis. No edema. Neurologic: Alert and oriented x 3.    Lab Results:  Recent Labs  10/25/13 0430 10/26/13 0555  WBC 12.2* 9.2  HGB 10.7* 10.4*  PLT 293 255    Recent Labs  10/25/13 0430 10/26/13 0555  NA 132* 138  K 4.8 4.0  CL 98 105  CO2 24 25  GLUCOSE 103* 81  BUN 83* 36*  CREATININE 2.04* 0.96    Recent Labs  10/25/13 0349 10/25/13 0900  TROPONINI  <0.30 <0.30   Hepatic Function Panel  Recent Labs  10/26/13 0555  PROT 5.4*  ALBUMIN 2.5*  AST 2607*  ALT 1987*  ALKPHOS 99  BILITOT 0.6    Imaging: Ct Abdomen Pelvis Wo Contrast  10/24/2013   CLINICAL DATA:  Shock.  Pericardial tamponade.  EXAM: CT ABDOMEN AND PELVIS WITHOUT CONTRAST  TECHNIQUE: Multidetector CT imaging of the abdomen and pelvis was performed following the standard protocol without intravenous contrast.  COMPARISON:  None.  FINDINGS: The patient has very low body fat and the lack of intravenous and oral contrast markedly limits the diagnostic utility of the scan.  The stomach is distended with fluid. There is a small amount of what appears to be contrast in the cecum. No dilated loops of large or small bowel. No free air in the abdomen. Suggestion of a small amount of free fluid in the pelvis.  Foley catheter in place.  Pericardial drain in place.  No focal liver lesions. Spleen and pancreas appear normal. Adrenal glands are not enlarged. No appreciable renal lesion.  Fairly extensive calcification in the abdominal aorta and iliac arteries. No osseous abnormality.  IMPRESSION: No visible acute abnormality of the abdomen other than a small amount of free fluid in the pelvis. The stomach is distended with fluid. I cannot visualize the gallbladder.   Electronically Signed   By: Geanie Cooley M.D.   On: 10/24/2013 16:27   Ct Chest Wo Contrast  10/24/2013   CLINICAL DATA:  Pericardial tamponade.  Shock.  Pulmonary edema.  EXAM: CT CHEST WITHOUT CONTRAST  TECHNIQUE: Multidetector CT imaging of the chest was performed following the standard protocol without IV contrast.  COMPARISON:  Chest x-ray dated 10/24/2013  FINDINGS: The patient has extensive emphysema in the upper lobes. There is a poorly defined 18 x 17 x 13 mm mass at the superior aspect of the left hilum. There is fullness at the inferior aspect of the left hilum which may represent a mass is well but the detail is poor  because of the lack of body fat and lack of intravenous contrast. There are small to moderate bilateral pleural effusions. There is atelectasis and partial consolidation in the left lower lobe and atelectasis in the right lower lobe.  There is a catheter in the pericardium with only a small amount of residual pericardial effusion.  Endotracheal tube appears in good position. No acute osseous abnormality.  IMPRESSION: 1. Mass lesion at the superior aspect of the left hilum and possible mass lesion at the inferior aspect of the left hilum. 2. Small to moderate bilateral pleural effusions. Atelectasis and consolidation in the left lower lobe. 3. Small residual pericardial effusion. Pericardial drain in place. Extensive emphysema.   Electronically Signed   By: Geanie Cooley M.D.   On: 10/24/2013 16:24   Dg Chest Port 1 View  10/26/2013   CLINICAL DATA:  Respiratory difficulty  EXAM: PORTABLE CHEST - 1 VIEW  COMPARISON:  10/25/2013  FINDINGS: Tubular structures stable. Left basilar consolidation stable. Hyperaeration. No pneumothorax.  IMPRESSION: Stable.  Left basilar consolidation   Electronically Signed   By: Maryclare Bean M.D.   On: 10/26/2013 07:46   Dg Chest Port 1 View  10/25/2013   CLINICAL DATA:  Intubated patient  EXAM: PORTABLE CHEST - 1 VIEW  COMPARISON:  Chest radiograph and chest CT, 10/24/2013  FINDINGS: Endotracheal tube is stable in well positioned, tip lying 3.7 cm above the Carina. Nasogastric tube passes below the diaphragm into the stomach.  Lungs are hyperexpanded reflecting COPD. Left lung base opacity is a combination of atelectasis and pleural fluid, stable. No pulmonary edema. No new lung opacities.  IMPRESSION: 1. Stable support apparatus. 2. Left pleural effusion with associated atelectasis, also stable. Underlying COPD. No new lung opacities.   Electronically Signed   By: Amie Portland M.D.   On: 10/25/2013 08:16   Dg Chest Portable 1 View  10/24/2013   CLINICAL DATA:  Intubation.   Line placement.  EXAM: PORTABLE CHEST - 1 VIEW  COMPARISON:  07/03/2007  FINDINGS: Endotracheal tube is in place, tip approximately 4.3 cm above chronic. A catheter has an inferior approach, overlying the heart, possibly representing pericardial catheter.  The heart is enlarged. There is dense opacification in the retrocardiac region on the left, consistent with infiltrate or atelectasis. There is perihilar edema.  IMPRESSION: 1. Interval placement of endotracheal tube and probable pericardial catheter. 2. Cardiomegaly and pulmonary edema. 3. Left lower lobe infiltrate or atelectasis.   Electronically Signed   By: Rosalie Gums M.D.   On: 10/24/2013 15:35    Telemetry: NSR 78, no afib, no vt.  Personally viewed.  Cardiac Studies:  Repeat echo with small residual effusion. Left plerual effusion noted.  Assessment/Plan:  Principal Problem:   Acute respiratory failure with hypoxia Active Problems:   Acute renal failure   Pericardial effusion   COPD with emphysema   Hilar mass  1) Pericardial tamponade  - no further drainage from pericardial drain  - repeat echo  with small effusion  - I pulled drain. No residual catheter. No ectopy. Occlusive dressing. Sterile. No complications.  - Discussed with family, son. Niki RN in close communication.   - BP 100 SBP. HR 78. Improved  - Cytology from fluid pending  2) Lung mass  - ? Lung CA  3) Vent dependent respiratory failure  - CCM  4) Acute renal failure  - resolution with pericardial drain  5) Polysubstance abuse  - social work  6) ID  - antibiotics wide coverage per CCM  7) Shock liver  - highly elevated LFTs. 2000.   - Hypotension.  - Hopefully will improve with time.   Remains critically ill. Son aware.     Philip Andrews 10/26/2013, 9:42 AM

## 2013-10-26 NOTE — Care Management Note (Addendum)
    Page 1 of 2   11/03/2013     3:40:20 PM   CARE MANAGEMENT NOTE 11/03/2013  Patient:  MAYA, SCHOLER   Account Number:  [id number]  Date Initiated:  10/26/2013  Documentation initiated by:  Junius Creamer  Subjective/Objective Assessment:   adm w mi,resp failure, vent     Action/Plan:   lives alone   Anticipated DC Date:  11/03/2013   Anticipated DC Plan:  HOME/SELF CARE  In-house referral  Clinical Social Worker      DC Associate Professor  CM consult  [facility]  [facility]      Choice offered to / List presented to:             Status of service:  Completed, signed off Medicare Important Message given?   (If response is "NO", the following Medicare IM given date fields will be blank) Date Medicare IM given:   Date Additional Medicare IM given:    Discharge Disposition:  HOME/SELF CARE  Per UR Regulation:  Reviewed for med. necessity/level of care/duration of stay  If discussed at Long Length of Stay Meetings, dates discussed:   10/29/2013  11/03/2013    Comments:  11/03/13 Jasmane Brockway,RN,BSN [phone] PT HAS NO INSURANCE AND STATES WILL HAVE TROUBLE GETTING DC MEDS FILLED. HE IS ELIGIBLE FOR MEDICATION ASSISTANCE THROUGH [facility].  MATCH LETTER GIVEN WITH EXPLANATION OF PROGRAM BENEFITS.  11/02/13 Albaro Deviney,RN,BSN [phone] PT FOR DC HOME TOMORROW, PER MD.  WILL NOT NEED SNF PLACEMENT.  PT HAS NO PCP; APPT MADE AT [facility] FOR DECEMBER 15 AT 4:00PM.  APPT INFO ADDED TO AVS.

## 2013-10-26 NOTE — Progress Notes (Signed)
Pt became very agitated; pulling off EKG leads; pulled out 2 IVs; pulled out foley catheter; trying to climb out of bed; very confused; took 6 RNs to hold down; 1mg  ativan given; MD made aware; CIWA protocol initiated per MD; family now at bedside and update on prior events; emotional support given to pt & to family;  will continue to monitor closely and update as needed

## 2013-10-27 ENCOUNTER — Inpatient Hospital Stay (HOSPITAL_COMMUNITY): Payer: Medicaid Other

## 2013-10-27 DIAGNOSIS — C3490 Malignant neoplasm of unspecified part of unspecified bronchus or lung: Secondary | ICD-10-CM

## 2013-10-27 DIAGNOSIS — D649 Anemia, unspecified: Secondary | ICD-10-CM

## 2013-10-27 DIAGNOSIS — I318 Other specified diseases of pericardium: Principal | ICD-10-CM

## 2013-10-27 DIAGNOSIS — C801 Malignant (primary) neoplasm, unspecified: Secondary | ICD-10-CM

## 2013-10-27 LAB — BASIC METABOLIC PANEL
BUN: 21 mg/dL (ref 6–23)
Calcium: 7.8 mg/dL — ABNORMAL LOW (ref 8.4–10.5)
Chloride: 105 mEq/L (ref 96–112)
Creatinine, Ser: 0.59 mg/dL (ref 0.50–1.35)
GFR calc Af Amer: >90 mL/min (ref >90)

## 2013-10-27 LAB — CBC
HCT: 30.7 % — ABNORMAL LOW (ref 39.0–52.0)
MCH: 31 pg (ref 26.0–34.0)
MCHC: 33.9 g/dL (ref 30.0–36.0)
RDW: 13.8 % (ref 11.5–15.5)
WBC: 6.6 10*3/uL (ref 4.0–10.5)

## 2013-10-27 LAB — HIV ANTIBODY (ROUTINE TESTING W REFLEX): HIV: NONREACTIVE

## 2013-10-27 MED ORDER — PNEUMOCOCCAL VAC POLYVALENT 25 MCG/0.5ML IJ INJ
0.5000 mL | INJECTION | INTRAMUSCULAR | Status: AC
  Administered 2013-10-27: 0.5 mL via INTRAMUSCULAR
  Filled 2013-10-27: qty 0.5

## 2013-10-27 MED ORDER — LEVOFLOXACIN 500 MG PO TABS
500.0000 mg | ORAL_TABLET | Freq: Every day | ORAL | Status: DC
  Administered 2013-10-27 – 2013-10-31 (×5): 500 mg via ORAL
  Filled 2013-10-27 (×5): qty 1

## 2013-10-27 MED ORDER — ENSURE COMPLETE PO LIQD
237.0000 mL | Freq: Two times a day (BID) | ORAL | Status: DC
  Administered 2013-10-27 – 2013-11-03 (×8): 237 mL via ORAL

## 2013-10-27 MED ORDER — SODIUM CHLORIDE 0.9 % IV SOLN: INTRAVENOUS | Status: DC

## 2013-10-27 MED ORDER — INFLUENZA VAC SPLIT QUAD 0.5 ML IM SUSP
0.5000 mL | INTRAMUSCULAR | Status: AC
  Administered 2013-10-27: 0.5 mL via INTRAMUSCULAR
  Filled 2013-10-27 (×3): qty 0.5

## 2013-10-27 MED ORDER — BIOTENE DRY MOUTH MT LIQD
15.0000 mL | Freq: Two times a day (BID) | OROMUCOSAL | Status: DC
  Administered 2013-10-27 – 2013-11-02 (×11): 15 mL via OROMUCOSAL

## 2013-10-27 MED ORDER — ADULT MULTIVITAMIN W/MINERALS CH
1.0000 | ORAL_TABLET | Freq: Every day | ORAL | Status: DC
  Administered 2013-10-27 – 2013-11-03 (×8): 1 via ORAL
  Filled 2013-10-27 (×8): qty 1

## 2013-10-27 NOTE — Consult Note (Signed)
[facility]  [phone]     ONCOLOGY  HOSPITAL CONSULTATION NOTE  Philip Andrews                                MR#: [id number]  DOB: 29-May-1956                       CSN#: [id number]  Referring MD: Dr. Molli Knock  Primary MD: Dr.  Jaquita Rector for Consult: Metastatic Lung Cancer  Philip Andrews is a 57 y.o.  AAM  with a history of tobacco and polysubstance abuse who has not seen a PCP in many years asked to see in consultation  for evaluation of metastatic adenocarcinoma. He was admitted on 10/24/13 with a massive pericardial effusion with associated shock and pulmonary edem  complicated by pericardial tamponade, requiring emergency pericardiocentesis yielding 1.4 liters of bloody fluid and subsequent pericardial drain placement until 11/17. CT of the chest without contrast demonstrated a  18 mm mass lesion at the superior aspect of the left hilum and possible mass lesion at the inferior aspect of the left hilum and small to moderate bilateral pleural effusions in addition to residual pericardiocentesis. The pericardial fluidcytology, [phone] showed malignant cells consistent with adenocarcinoma.  No IH stains were obtained. CT of the abdomen and pelvis without contrast was negative with the exemption of small amount of free fluid in the pelvis He was intubated until 11/17.         PMH:  Past Medical History  Diagnosis Date  . 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  Surgeries:  exploratory lap with patch closure of ulcer, 2004  Allergies:  Allergies  Allergen Reactions  . Benadryl [Diphenhydramine Hcl]     Pt states he just knows he's allergic    Medications:   Prior to Admission:  No prescriptions prior to admission    WNU:[id number],  LORazepam  ROS: Constitutional: Positive for weight loss ?amount. Negative for fever, chills or  night sweats.  Eyes: Negative for blurred vision and double vision.  Respiratory: Negative for productive cough. No hemoptysis.No hoarseness.No neck swelling.  Positive for shortness of breath on exertion. No pleuritic chest pain.  Cardiovascular: Negative for chest pain. No palpitations.  GI: Negative for  nausea, vomiting, diarrhea or constipation. No change in bowel caliber. No  Melena or hematochezia. Had  abdominal pain on admission, not currently present.  GU: Negative for hematuria. No loss of urinary control. No urinary retention. Skin: Negative for itching. No rash. No petechia. No easy  Bruising. Musculoskeletal: Denies back , arm pain.  Neurological: No headaches.No confusion. No motor or sensory deficits.  Family History:    Mother died of old age. Father died of ETOH complications. 12 siblings, one with liver Cancer. No other family history of malignancy   Social History:  . Single. 2 children, one in [place], one in [place]. Main contact is his son, Philip Andrews, [phone]. Smokes 2 ppd for 45 years. Drinks at least 6 beers a day since young age. He uses IV cocaine. 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  Physical Exam    Filed Vitals:   10/27/13 1300  BP: 154/57  Pulse: 79  Temp:  Resp: 28     Filed Weights   10/25/13 0500 10/26/13 0402 10/27/13 0403  Weight: 140 lb 6.9 oz (63.7 kg) 139 lb 8.8 oz (63.3 kg) 141 lb 15.6 oz (64.4 kg)   General: 64  -year-old  in no acute distress , knows place, name, not to time, falls back asleep easily .cachectic, ill appearing. HEENT: Normocephalic, atraumatic, PERRLA. Sclerae anicteric. Oral cavity without thrush or lesions.poor dentition NECK:supple. no thyromegaly, no cervical or supraclavicular adenopathy  LUNGS:decreased breath sounds at the bases No wheezing, rhonchi or rales. No axillary masses. BREASTS:  not examined. CARDIOVASCULAR: regular rate and rhythm, occasional ectopic beat, no murmur , rubs or gallops ABDOMEN: soft nontender , bowel sounds x4. No HSM. No masses palpable.  GU-testes without mass EXTREMITIES: no edema. No bruising or petechial rash MUSCULOSKELETAL: no spinal tenderness.  NEURO: Non Focal. No Horner's. Lethargic, not oriented .  Labs:  CBC   Recent Labs Lab 10/24/13 1452  10/24/13 1540 10/24/13 1627 10/25/13 0430 10/26/13 0555 10/27/13 0512  WBC 5.0  --   --  13.8* 12.2* 9.2 6.6  HGB 5.7*  < > 12.9* 11.8* 10.7* 10.4* 10.4*  HCT 17.2*  < > 38.0* 34.1* 30.7* 30.5* 30.7*  PLT 91*  --   --  261 293 255 236  MCV 97.7  --   --  92.4 89.8 91.6 91.6  MCH 32.4  --   --  32.0 31.3 31.2 31.0  MCHC 33.1  --   --  34.6 34.9 34.1 33.9  RDW 14.8  --   --  13.3 13.1 13.5 13.8  LYMPHSABS  --   --   --  1.5  --   --   --   MONOABS  --   --   --  0.6  --   --   --   EOSABS  --   --   --  0.0  --   --   --   BASOSABS  --   --   --  0.0  --   --   --   < > = values in this interval not displayed.   CMP    Recent Labs Lab 10/24/13 1540 10/24/13 1627 10/25/13 0430 10/26/13 0555 10/27/13 0512  NA 129* 130* 132* 138 136  K 4.9 4.7 4.8 4.0 4.0  CL 98 87* 98 105 105  CO2  --  10* 24 25 24   GLUCOSE 51* 71 103* 81 120*  BUN 91* 85* 83* 36* 21  CREATININE 3.30* 2.97* 2.04* 0.96 0.59  CALCIUM  --  8.6 7.4* 7.6* 7.8*  AST  --  3810*  --  2607*  --   ALT  --  1600*  --  1987*  --   ALKPHOS  --  122*  --  99  --   BILITOT  --  1.0  --  0.6  --         Component Value Date/Time   BILITOT 0.6 10/26/2013 0555      Recent Labs Lab 10/24/13 1452 10/24/13 1627  INR 7.86* 2.50*    No results found for this basename: DDIMER,  in the last 72 hours   Anemia panel:  No results found for this basename: VITAMINB12, FOLATE, FERRITIN, TIBC, IRON, RETICCTPCT,  in the last 72 hours   Imaging Studies:  Ct Abdomen Pelvis Wo Contrast  10/24/2013   CLINICAL DATA:   Shock.  Pericardial tamponade.  EXAM: CT ABDOMEN AND PELVIS WITHOUT  CONTRAST  TECHNIQUE: Multidetector CT imaging of the abdomen and pelvis was performed following the standard protocol without intravenous contrast.  COMPARISON:  None.  FINDINGS: The patient has very low body fat and the lack of intravenous and oral contrast markedly limits the diagnostic utility of the scan.  The stomach is distended with fluid. There is a small amount of what appears to be contrast in the cecum. No dilated loops of large or small bowel. No free air in the abdomen. Suggestion of a small amount of free fluid in the pelvis.  Foley catheter in place.  Pericardial drain in place.  No focal liver lesions. Spleen and pancreas appear normal. Adrenal glands are not enlarged. No appreciable renal lesion.  Fairly extensive calcification in the abdominal aorta and iliac arteries. No osseous abnormality.  IMPRESSION: No visible acute abnormality of the abdomen other than a small amount of free fluid in the pelvis. The stomach is distended with fluid. I cannot visualize the gallbladder.   Electronically Signed   By: Geanie Cooley M.D.   On: 10/24/2013 16:27   Ct Chest Wo Contrast  10/24/2013   CLINICAL DATA:  Pericardial tamponade.  Shock.  Pulmonary edema.  EXAM: CT CHEST WITHOUT CONTRAST  TECHNIQUE: Multidetector CT imaging of the chest was performed following the standard protocol without IV contrast.  COMPARISON:  Chest x-ray dated 10/24/2013  FINDINGS: The patient has extensive emphysema in the upper lobes. There is a poorly defined 18 x 17 x 13 mm mass at the superior aspect of the left hilum. There is fullness at the inferior aspect of the left hilum which may represent a mass is well but the detail is poor because of the lack of body fat and lack of intravenous contrast. There are small to moderate bilateral pleural effusions. There is atelectasis and partial consolidation in the left lower lobe and atelectasis in the right lower  lobe.  There is a catheter in the pericardium with only a small amount of residual pericardial effusion.  Endotracheal tube appears in good position. No acute osseous abnormality.  IMPRESSION: 1. Mass lesion at the superior aspect of the left hilum and possible mass lesion at the inferior aspect of the left hilum. 2. Small to moderate bilateral pleural effusions. Atelectasis and consolidation in the left lower lobe. 3. Small residual pericardial effusion. Pericardial drain in place. Extensive emphysema.   Electronically Signed   By: Geanie Cooley M.D.   On: 10/24/2013 16:24   Dg Chest Port 1 View  10/27/2013   CLINICAL DATA:  57 year old male. Acute respiratory failure. Initial encounter. Status post treatment of acute pericardial effusion. Lung consolidation.  EXAM: PORTABLE CHEST - 1 VIEW  COMPARISON:  10/26/2013 and earlier.  FINDINGS: Portable AP semi upright view at 0412 hrs. Increased bilateral veiling pulmonary opacity. Continued dense retrocardiac opacity. Stable cardiac size and mediastinal contours.  The patient is extubated. Pericardial catheter no longer visible. Enteric tube has been removed.  No pneumothorax.  IMPRESSION: 1. Extubated.  Enteric tube and pericardial catheter removed.  2. Increased bilateral veiling pleural effusions and associated bilateral lower lobe predominant atelectasis.   Electronically Signed   By: Augusto Gamble M.D.   On: 10/27/2013 07:14   Dg Chest Port 1 View  10/26/2013   CLINICAL DATA:  Respiratory difficulty  EXAM: PORTABLE CHEST - 1 VIEW  COMPARISON:  10/25/2013  FINDINGS: Tubular structures stable. Left basilar consolidation stable. Hyperaeration. No pneumothorax.  IMPRESSION: Stable.  Left basilar consolidation   Electronically Signed  By: Maryclare Bean M.D.   On: 10/26/2013 07:46   Dg Chest Port 1 View  10/25/2013   CLINICAL DATA:  Intubated patient  EXAM: PORTABLE CHEST - 1 VIEW  COMPARISON:  Chest radiograph and chest CT, 10/24/2013  FINDINGS: Endotracheal tube is  stable in well positioned, tip lying 3.7 cm above the Carina. Nasogastric tube passes below the diaphragm into the stomach.  Lungs are hyperexpanded reflecting COPD. Left lung base opacity is a combination of atelectasis and pleural fluid, stable. No pulmonary edema. No new lung opacities.  IMPRESSION: 1. Stable support apparatus. 2. Left pleural effusion with associated atelectasis, also stable. Underlying COPD. No new lung opacities.   Electronically Signed   By: Amie Portland M.D.   On: 10/25/2013 08:16   Dg Chest Portable 1 View  10/24/2013   CLINICAL DATA:  Intubation.  Line placement.  EXAM: PORTABLE CHEST - 1 VIEW  COMPARISON:  07/03/2007  FINDINGS: Endotracheal tube is in place, tip approximately 4.3 cm above chronic. A catheter has an inferior approach, overlying the heart, possibly representing pericardial catheter.  The heart is enlarged. There is dense opacification in the retrocardiac region on the left, consistent with infiltrate or atelectasis. There is perihilar edema.  IMPRESSION: 1. Interval placement of endotracheal tube and probable pericardial catheter. 2. Cardiomegaly and pulmonary edema. 3. Left lower lobe infiltrate or atelectasis.   Electronically Signed   By: Rosalie Gums M.D.   On: 10/24/2013 15:35      A/P: 57 y.o. male  Asked to see for evaluation of metastatic adenocarcinoma in the setting of pericardial tamponade with malignant fluid. Dr. Truett Perna   is to see the patient following this consult with recommendations regarding diagnosis, treatment options and further workup studies, which may include imaging completion with breain MRI to rule out mets. An addendum to this note is to be written.  Thank you for the referral.  Marcos Eke, PA-C 10/27/2013 1:30 PM  Mr. Teuscher was interviewed and examined. X-rays were reviewed.  Impression:  1. Malignant pericardial effusion, cytology positive for adenocarcinoma  2. Presentation with respiratory failure/cardiac A  non-secondary to #1  3. History of polysubstance abuse  4. Acute renal failure-resolved  5. Anemia-normocytic  6. Poor social support system  7. Left hilar mass  8. Altered metal status-? Related to medications   Mr. Hansen was admitted with cardiac tamponade. He underwent a pericardiocentesis procedure and the cytology from the pericardial fluid returned positive for adenocarcinoma. He most likely has non-small cell lung cancer given the smoking history and chest CT findings. The differential diagnosis includes metastatic adenocarcinoma from another primary site. I discussed the cytology findings and likely diagnosis with Mr. Odem and his sister.  Recommendations:  1. Request immunohistochemical stains on the pleural fluid cytology in an attempt to establish a primary tumor site.  2. Consider a diagnostic bronchoscopy if the immunohistochemical stains do not confirm lung cancer  3. Request EGFR and ALK testing if a diagnosis of non-small cell lung cancer is confirmed  4. Complete staging evaluation with a brain MRI  5. Consult cardiothoracic surgery to consider the indication for a pericardial window procedure  6. Care management/social work consult-? Placement  7. Oncology will continue following him in the hospital and we will arrange outpatient oncology care

## 2013-10-27 NOTE — Progress Notes (Signed)
PULMONARY  / CRITICAL CARE MEDICINE  Name: Philip Andrews MRN: [id number] DOB: 1956/11/11    ADMISSION DATE:  10/24/2013  REFERRING MD :  EDP  PRIMARY SERVICE: PCCM   CHIEF COMPLAINT:  Cardiac tamponade  BRIEF PATIENT DESCRIPTION:  57 yo male with chest pain/hypotension from cardiac tamponade.  PCCM asked to admit to ICU. 57 yo male with known hx of polysubstance abuse admitted to ER with chest pain and hypotension found to have large pericardial effusion with cardiac tamponade requiring emergent pericardiocentesis w/ drain in ER-1.4 L bloody fluid removed . PCCM asked to admit . UDS Pos cocaine  SIGNIFICANT EVENTS: 11/15 Admit, VDRF, pericardiocentesis 11/17 Pericardial catheter removed  STUDIES:  11/15 Pericardiocentesis w/ pericardial drain placement >> 11/15 CT chest >> centrilobular emphysema, bullous changes, 18 mm mass LT hilum, b/l pleural effusions, ATX LLL 11/15 CT abd >> negative 11/15 Echo >> EF 55 to 60%, mild MR, PAS 47 mmHg 11/16 Echo >> EF 65 to 70%  LINES / TUBES: 11/15 Pericardial Drain >>11/17 11/15 ETT >>11/17  CULTURES: 11/16 Pericardial fluid cx >> no growth  ANTIBIOTICS: 11/15 Vanc >> 11/15 Zosyn >>  SUBJECTIVE:  Resting comfortably on room air  VITAL SIGNS: Temp:  [97.9 F (36.6 C)-99.6 F (37.6 C)] 97.9 F (36.6 C) (11/18 0800) Pulse Rate:  [66-103] 87 (11/18 1000) Resp:  [10-21] 20 (11/18 1000) BP: (91-140)/(51-95) 104/61 mmHg (11/18 1000) SpO2:  [90 %-100 %] 93 % (11/18 1000) FiO2 (%):  [28 %] 28 % (11/17 1937) Weight:  [141 lb 15.6 oz (64.4 kg)] 141 lb 15.6 oz (64.4 kg) (11/18 0403) VENTILATOR SETTINGS: Vent Mode:  [-]  FiO2 (%):  [28 %] 28 % INTAKE / OUTPUT: Intake/Output     11/17 0701 - 11/18 0700 11/18 0701 - 11/19 0700   P.O.  720   I.V. (mL/kg) 2049.1 (31.8) 243.7 (3.8)   Other 3    IV Piggyback 250    Total Intake(mL/kg) 2302.1 (35.7) 963.7 (15)   Urine (mL/kg/hr) 910 (0.6)    Emesis/NG output     Drains     Total  Output 910     Net +1392.1 +963.7        Urine Occurrence 1 x      PHYSICAL EXAMINATION: GEN : Cachectic, no distress, resting comfortably on room air HEENT: PERL, sclera anicteric, MM moist and pink LUNG: Decreased BS, no wheeze   Cardiac : RRR, no murmurs Abdomen, soft, +BS, mild generalized tenderness to deep palpation Extremities: no edema Neuro: Somnolent, but can be aroused.    LABS:  CBC  Recent Labs Lab 10/25/13 0430 10/26/13 0555 10/27/13 0512  WBC 12.2* 9.2 6.6  HGB 10.7* 10.4* 10.4*  HCT 30.7* 30.5* 30.7*  PLT 293 255 236   Coag's  Recent Labs Lab 10/24/13 1452 10/24/13 1627  APTT 67* 45*  INR 7.86* 2.50*   BMET  Recent Labs Lab 10/25/13 0430 10/26/13 0555 10/27/13 0512  NA 132* 138 136  K 4.8 4.0 4.0  CL 98 105 105  CO2 24 25 24   BUN 83* 36* 21  CREATININE 2.04* 0.96 0.59  GLUCOSE 103* 81 120*   Electrolytes  Recent Labs Lab 10/25/13 0430 10/26/13 0555 10/27/13 0512  CALCIUM 7.4* 7.6* 7.8*   Sepsis Markers  Recent Labs Lab 10/24/13 1655 10/24/13 1900  LATICACIDVEN 14.52* 10.0*   ABG  Recent Labs Lab 10/25/13 0500  PHART 7.358  PCO2ART 45.1*  PO2ART 60.5*   Liver Enzymes  Recent Labs Lab  10/24/13 1627 10/26/13 0555  AST 3810* 2607*  ALT 1600* 1987*  ALKPHOS 122* 99  BILITOT 1.0 0.6  ALBUMIN 3.1* 2.5*   Cardiac Enzymes  Recent Labs Lab 10/24/13 2323 10/25/13 0349 10/25/13 0900  TROPONINI <0.30 <0.30 <0.30   Glucose  Recent Labs Lab 10/25/13 0356 10/25/13 2037 10/26/13 0734 10/26/13 0735 10/26/13 0949 10/26/13 1145  GLUCAP 99 96 69* 71 73 71    Imaging Dg Chest Port 1 View  10/27/2013   CLINICAL DATA:  57 year old male. Acute respiratory failure. Initial encounter. Status post treatment of acute pericardial effusion. Lung consolidation.  EXAM: PORTABLE CHEST - 1 VIEW  COMPARISON:  10/26/2013 and earlier.  FINDINGS: Portable AP semi upright view at 0412 hrs. Increased bilateral veiling  pulmonary opacity. Continued dense retrocardiac opacity. Stable cardiac size and mediastinal contours.  The patient is extubated. Pericardial catheter no longer visible. Enteric tube has been removed.  No pneumothorax.  IMPRESSION: 1. Extubated.  Enteric tube and pericardial catheter removed.  2. Increased bilateral veiling pleural effusions and associated bilateral lower lobe predominant atelectasis.   Electronically Signed   By: Augusto Gamble M.D.   On: 10/27/2013 07:14   Dg Chest Port 1 View  10/26/2013   CLINICAL DATA:  Respiratory difficulty  EXAM: PORTABLE CHEST - 1 VIEW  COMPARISON:  10/25/2013  FINDINGS: Tubular structures stable. Left basilar consolidation stable. Hyperaeration. No pneumothorax.  IMPRESSION: Stable.  Left basilar consolidation   Electronically Signed   By: Maryclare Bean M.D.   On: 10/26/2013 07:46     ASSESSMENT / PLAN:  PULMONARY A:  Acute respiratory failure 2nd to cardiac tamponade. COPD/emphysema with bullous lung disease. Lt perihilar mass - likely adenocarcinoma. Tobacco abuse. P:   -Spo2 > 90% -f/u CXR  -scheduled BD's -malignant cells found in pericardial effusion  CARDIOVASCULAR A:  Pericardial effusion with tamponade - resolved P:  -monitor hemodynamics  RENAL A:   Acute renal failure - resolved. P:   -monitor renal fx, urine outpt, electrolytes   GASTROINTESTINAL A:   Nutrition. P:   - advance to regular diet  HEMATOLOGIC A:   Anemia of chronic disease and critical illness. P:  - Intermittently check cbcs -SCD for DVT prevention  INFECTIOUS A:   S/p bedside pericardial drain in ER 11/15. Lt lower lung consolidation. P:   - Levaquin PO  ENDOCRINE A:   No hx of DM. P:   -monitor CBG  NEUROLOGIC A:  Polysubstance abuse - UDS positive for cocaine. P:   -monitor mental status   Terri Piedra [facility] Physician Assistant Student 10/27/18, 10:56 AM  Pt is now hemodynamically stable and cardiac tamponade has  resolved.  Effusion was due to malignancy most likely adenocarcinoma according to path reports.  Patient needs to have full workup/biopsy of the perihilar masses.  Will consult Oncology (called). Will advance patient to regular diet at this time.  Check labs intermittently.  Consider testing for HIV given drug use history once out of the ICU.  Will change vanc and zosyn to levaquin as patient has had no recent hospital admissions.  Patient is currently stable enough to be transferred out of the ICU to tele and transferred to the care of [facility], PCCM will sign off, please call back if needed.   Patient seen and examined, agree with above note.  I dictated the care and orders written for this patient under my direction.  Alyson Reedy, MD [phone]

## 2013-10-27 NOTE — Progress Notes (Addendum)
NUTRITION FOLLOW UP  Intervention:   1.  General healthful diet; encourage intake as able.  2.  Supplements; Ensure Complete po BID, each supplement provides 350 kcal and 13 grams of protein. 3.  MVI daily  Nutrition Dx:   Inadequate oral intake, ongoing with improvement  Monitor:   1.  Food/Beverage; pt meeting >/=90% estimated needs with tolerance. 2.  Wt/wt change; monitor trends  Assessment:   57 yo male with known hx of polysubstance abuse admitted to ER with chest pain and hypotension found to have large pericardial effusion with cardiac tamponade requiring emergent pericardiocentesis w/ drain in ER .  Pt extubated yesterday and diet advanced to Regular.  Pt is not oriented to situation today.  Family at bedside without information related to nutrition hx.  No wt hx available.   Nutrition Focused Physical Exam: Subcutaneous Fat:  Orbital Region: moderate wasting Upper Arm Region: moderate wasting Thoracic and Lumbar Region: n/a  Muscle:  Temple Region: severe wasting Clavicle Bone Region: severe wasting Clavicle and Acromion Bone Region: severe wasting Scapular Bone Region: moderate wasting Dorsal Hand: severe wasting Patellar Region: n/a Anterior Thigh Region: n/a Posterior Calf Region: n/a  Edema: none present  Pt meets criteria for severe MALNUTRITION in the context of chronic illness as evidenced by nutrition focused physical exam.  Height: Ht Readings from Last 1 Encounters:  10/24/13 5\' 9"  (1.753 m)    Weight Status:   Wt Readings from Last 1 Encounters:  10/27/13 141 lb 15.6 oz (64.4 kg)    Re-estimated needs:  Kcal: 2060-2260 Protein: 83-96g Fluid: >2.0 L/day  Skin: intact  Diet Order: General   Intake/Output Summary (Last 24 hours) at 10/27/13 1219 Last data filed at 10/27/13 1100  Gross per 24 hour  Intake 2789.7 ml  Output    735 ml  Net 2054.7 ml    Last BM: PTA   Labs:   Recent Labs Lab 10/25/13 0430 10/26/13 0555  10/27/13 0512  NA 132* 138 136  K 4.8 4.0 4.0  CL 98 105 105  CO2 24 25 24   BUN 83* 36* 21  CREATININE 2.04* 0.96 0.59  CALCIUM 7.4* 7.6* 7.8*  GLUCOSE 103* 81 120*    CBG (last 3)   Recent Labs  10/26/13 0735 10/26/13 0949 10/26/13 1145  GLUCAP 71 73 71    Scheduled Meds: . albuterol  2.5 mg Nebulization Q6H  . antiseptic oral rinse  15 mL Mouth Rinse BID  . ipratropium  0.5 mg Nebulization Q6H  . levofloxacin  500 mg Oral Daily    Continuous Infusions: . sodium chloride 10 mL/hr at 10/27/13 0845  . dextrose 5 % and 0.45 % NaCl with KCl 20 mEq/L 75 mL/hr at 10/27/13 0533    Loyce Dys, MS RD LDN Clinical Inpatient Dietitian Pager: [phone] Weekend/After hours pager: [phone]

## 2013-10-27 NOTE — Progress Notes (Signed)
       Patient Name: Philip Andrews Date of Encounter: 10/27/2013    SUBJECTIVE: Lethargic. No complaint.  TELEMETRY:  NSR/ST Filed Vitals:   10/27/13 0600 10/27/13 0700 10/27/13 0800 10/27/13 0837  BP: 97/61 93/61 91/56    Pulse: 66 66 66   Temp:   97.9 F (36.6 C)   TempSrc:   Oral   Resp: 15 15 14    Height:      Weight:      SpO2: 98% 97% 97% 95%    Intake/Output Summary (Last 24 hours) at 10/27/13 0843 Last data filed at 10/27/13 0500  Gross per 24 hour  Intake 1952.9 ml  Output    810 ml  Net 1142.9 ml    LABS: Basic Metabolic Panel:  Recent Labs  63/01/60 0555 10/27/13 0512  NA 138 136  K 4.0 4.0  CL 105 105  CO2 25 24  GLUCOSE 81 120*  BUN 36* 21  CREATININE 0.96 0.59  CALCIUM 7.6* 7.8*   CBC:  Recent Labs  10/24/13 1627  10/26/13 0555 10/27/13 0512  WBC 13.8*  < > 9.2 6.6  NEUTROABS 11.7*  --   --   --   HGB 11.8*  < > 10.4* 10.4*  HCT 34.1*  < > 30.5* 30.7*  MCV 92.4  < > 91.6 91.6  PLT 261  < > 255 236  < > = values in this interval not displayed. Cardiac Enzymes:  Recent Labs  10/24/13 2323 10/25/13 0349 10/25/13 0900  TROPONINI <0.30 <0.30 <0.30      Radiology/Studies:  n/a  Physical Exam: Blood pressure 91/56, pulse 66, temperature 97.9 F (36.6 C), temperature source Oral, resp. rate 14, height 5\' 9"  (1.753 m), weight 141 lb 15.6 oz (64.4 kg), SpO2 95.00%. Weight change: 2 lb 6.8 oz (1.1 kg)   Pericardial rub. Marked reduction in JVD  ASSESSMENT:  1. Malignant pericardial effusion 2. Pericardial tamponade resolved.  Plan:  1. Check HIV 2. We will sign off.  Philip Andrews 10/27/2013, 8:43 AM

## 2013-10-28 LAB — BASIC METABOLIC PANEL
Chloride: 103 mEq/L (ref 96–112)
GFR calc Af Amer: >90 mL/min (ref >90)
GFR calc non Af Amer: >90 mL/min (ref >90)
Glucose, Bld: 96 mg/dL (ref 70–99)
Potassium: 3.7 mEq/L (ref 3.5–5.1)
Sodium: 135 mEq/L (ref 135–145)

## 2013-10-28 LAB — BODY FLUID CULTURE

## 2013-10-28 LAB — PHOSPHORUS: Phosphorus: 2 mg/dL — ABNORMAL LOW (ref 2.3–4.6)

## 2013-10-28 LAB — CBC
HCT: 32.5 % — ABNORMAL LOW (ref 39.0–52.0)
Hemoglobin: 11.3 g/dL — ABNORMAL LOW (ref 13.0–17.0)
MCHC: 34.8 g/dL (ref 30.0–36.0)
RBC: 3.59 MIL/uL — ABNORMAL LOW (ref 4.22–5.81)
RDW: 13.5 % (ref 11.5–15.5)
WBC: 7.3 10*3/uL (ref 4.0–10.5)

## 2013-10-28 LAB — MAGNESIUM: Magnesium: 1.9 mg/dL (ref 1.5–2.5)

## 2013-10-28 MED ORDER — ALBUTEROL SULFATE (5 MG/ML) 0.5% IN NEBU
2.5000 mg | INHALATION_SOLUTION | Freq: Three times a day (TID) | RESPIRATORY_TRACT | Status: DC
  Administered 2013-10-28 – 2013-10-31 (×9): 2.5 mg via RESPIRATORY_TRACT
  Filled 2013-10-28 (×11): qty 0.5

## 2013-10-28 MED ORDER — IPRATROPIUM BROMIDE 0.02 % IN SOLN
0.5000 mg | Freq: Three times a day (TID) | RESPIRATORY_TRACT | Status: DC
  Administered 2013-10-28 – 2013-10-31 (×9): 0.5 mg via RESPIRATORY_TRACT
  Filled 2013-10-28 (×11): qty 2.5

## 2013-10-28 NOTE — Progress Notes (Signed)
       Patient Name: MARCIAL PLESS Date of Encounter: 10/28/2013    SUBJECTIVE: Comfortable. No complaint.  TELEMETRY:  NSR/ST Filed Vitals:   10/28/13 0600 10/28/13 0700 10/28/13 0800 10/28/13 0824  BP: 137/85 139/81 143/82   Pulse: 93 92 91   Temp:   98.2 F (36.8 C)   TempSrc:   Axillary   Resp: 33  37   Height:      Weight:      SpO2: 96%  95% 96%    Intake/Output Summary (Last 24 hours) at 10/28/13 0956 Last data filed at 10/28/13 0800  Gross per 24 hour  Intake 1506.25 ml  Output    750 ml  Net 756.25 ml    LABS: Basic Metabolic Panel:  Recent Labs  84/69/62 0512 10/28/13 0730  NA 136 135  K 4.0 3.7  CL 105 103  CO2 24 22  GLUCOSE 120* 96  BUN 21 11  CREATININE 0.59 0.60  CALCIUM 7.8* 8.4  MG  --  1.9  PHOS  --  2.0*   CBC:  Recent Labs  10/27/13 0512 10/28/13 0730  WBC 6.6 7.3  HGB 10.4* 11.3*  HCT 30.7* 32.5*  MCV 91.6 90.5  PLT 236 288   Cardiac Enzymes: No results found for this basename: CKTOTAL, CKMB, CKMBINDEX, TROPONINI,  in the last 72 hours NON REACTIVE (11/18 0940)  Radiology/Studies:  n/a  Physical Exam: Blood pressure 143/82, pulse 91, temperature 98.2 F (36.8 C), temperature source Axillary, resp. rate 37, height 5\' 9"  (1.753 m), weight 141 lb 5 oz (64.1 kg), SpO2 96.00%. Weight change: -10.6 oz (-0.3 kg)   Confused S1S2 Marked reduction in JVD No edema  ASSESSMENT:  1. Malignant pericardial effusion.  Oncology deciding on treatment options.  Options may be limited due to suboptimal social situation. 2. Pericardial tamponade resolved.  Drain removed.    Plan:  1.  HIV- nonreactive 2. We will sign off.    Signed, Lance Muss S. 10/28/2013, 9:56 AM

## 2013-10-28 NOTE — Progress Notes (Signed)
See full consult note 10/27/2013.  I reviewed the cytology slides with Dr. Frederica Kuster. The cytology is diagnostic of adenocarcinoma. Immunohistochemical stains will be performed in an attempt to confirmed a primary lung cancer.  If a diagnosis of lung cancer is confirmed I will recommend hospice care.

## 2013-10-28 NOTE — Progress Notes (Signed)
[facility] Progress Note [place] TEAM 1 - Stepdown/ICU TEAM  Philip Andrews XBJ:[id number] DOB: [birth year] DOA: 10/24/2013 PCP: No primary provider on file.  Admit HPI / Brief Narrative:  57 yo male with known hx of polysubstance abuse admitted to ER with chest pain and hypotension found to have large pericardial effusion with cardiac tamponade requiring emergent pericardiocentesis w/ drain in ER of 1.4 L bloody fluid. PCCM asked to admit.  UDS Pos cocaine.   SIGNIFICANT EVENTS:  11/15 Admit, VDRF, pericardiocentesis  11/17 Pericardial catheter removed  Assessment/Plan:  Acute respiratory failure 2nd to cardiac tamponade - resolved  Toxic encephalopathy Etiology unclear - can't r/o possibility of a CNS met - will check B12 and folic acid - could be element of withdrawal (unclear what substances pt uses regularly) - plan CT head in AM   Pericardial effusion with tamponade - resolved S/p emergent drainage in ER per Cardiology  Acute renal failure - resolved  Transaminitis ?EtOH abuse - ? Shock liver - recheck in AM - no evidence of mets of liver on CT  Lt lower lung consolidation Cont empiric abx to complete 7 day course   COPD/emphysema with bullous lung disease Well compensated at this time   Lt perihilar mass - adenocarcinoma  Per Onc cytology is diagnostic of adenocarcinoma - awaiting immunohistochemical stains - pt will not likely be a candidate for any aggressive tx  Tobacco abuse  Anemia of chronic disease and critical illness Hgb stable/improving  Polysubstance abuse - UDS positive for cocaine  History of free air perforated ulcer 2004, requiring exploratory lap with patch closure  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 and small left frontal intracerebral petechial contusions 07/2007 Family reports pt is not confused at baseline, and is able to function independently   Code Status: FULL Family Communication: spoke w/ son at  length at bedside  Disposition Plan: transfer to medical bed - await results from Oncology testing to plan ultimate disposition  Consultants: PCCM >> [facility] Oncology  Procedures: 11/15 Pericardiocentesis w/ pericardial drain placement >> 11/17 11/15 CT chest >> centrilobular emphysema, bullous changes, 18 mm mass LT hilum, b/l pleural effusions, ATX LLL  11/15 CT abd >> negative  11/15 ETT >>11/17 11/15 Echo >> EF 55 to 60%, mild MR, PAS 47 mmHg  11/16 Echo >> EF 65 to 70%  Antibiotics: 11/15 Vanc >>  11/15 Zosyn >>  DVT prophylaxis: SCDs only   HPI/Subjective: Pt is alert but quite confused.  Fixated on going home.  Son at bedside confirms patient is not confused at baseline.  Objective: Blood pressure 146/81, pulse 91, temperature 97.3 F (36.3 C), temperature source Axillary, resp. rate 40, height 5\' 9"  (1.753 m), weight 64.1 kg (141 lb 5 oz), SpO2 92.00%.  Intake/Output Summary (Last 24 hours) at 10/28/13 1319 Last data filed at 10/28/13 1100  Gross per 24 hour  Intake 1056.25 ml  Output    975 ml  Net  81.25 ml   Exam: General: No acute respiratory distress Lungs: Clear to auscultation bilaterally without wheezes or crackles Cardiovascular: Regular rate and rhythm without murmur gallop or rub normal S1 and S2 Abdomen: Nontender, nondistended, soft, bowel sounds positive, no rebound, no ascites, no appreciable mass Extremities: No significant cyanosis, clubbing, or edema bilateral lower extremities  Data Reviewed: Basic Metabolic Panel:  Recent Labs Lab 10/24/13 1627 10/25/13 0430 10/26/13 0555 10/27/13 0512 10/28/13 0730  NA 130* 132* 138 136 135  K 4.7 4.8 4.0 4.0 3.7  CL 87* 98 105 105 103  CO2 10* 24 25 24 22   GLUCOSE 71 103* 81 120* 96  BUN 85* 83* 36* 21 11  CREATININE 2.97* 2.04* 0.96 0.59 0.60  CALCIUM 8.6 7.4* 7.6* 7.8* 8.4  MG  --   --   --   --  1.9  PHOS  --   --   --   --  2.0*   Liver Function Tests:  Recent Labs Lab  10/24/13 1627 10/26/13 0555  AST 3810* 2607*  ALT 1600* 1987*  ALKPHOS 122* 99  BILITOT 1.0 0.6  PROT 6.4 5.4*  ALBUMIN 3.1* 2.5*   CBC:  Recent Labs Lab 10/24/13 1627 10/25/13 0430 10/26/13 0555 10/27/13 0512 10/28/13 0730  WBC 13.8* 12.2* 9.2 6.6 7.3  NEUTROABS 11.7*  --   --   --   --   HGB 11.8* 10.7* 10.4* 10.4* 11.3*  HCT 34.1* 30.7* 30.5* 30.7* 32.5*  MCV 92.4 89.8 91.6 91.6 90.5  PLT 261 293 255 236 288   Cardiac Enzymes:  Recent Labs Lab 10/24/13 1549 10/24/13 2323 10/25/13 0349 10/25/13 0900  TROPONINI <0.30 <0.30 <0.30 <0.30   CBG:  Recent Labs Lab 10/25/13 2037 10/26/13 0734 10/26/13 0735 10/26/13 0949 10/26/13 1145  GLUCAP 96 69* 71 73 71    Recent Results (from the past 240 hour(s))  MRSA PCR SCREENING     Status: None   Collection Time    10/24/13  6:30 PM      Result Value Range Status   MRSA by PCR NEGATIVE  NEGATIVE Final   Comment:            The GeneXpert MRSA Assay (FDA     approved for NASAL specimens     only), is one component of a     comprehensive MRSA colonization     surveillance program. It is not     intended to diagnose MRSA     infection nor to guide or     monitor treatment for     MRSA infections.  AFB CULTURE WITH SMEAR     Status: None   Collection Time    10/25/13 12:21 PM      Result Value Range Status   Specimen Description PERICARDIAL FLUID   Final   Special Requests NONE   Final   ACID FAST SMEAR     Final   Value: NO ACID FAST BACILLI SEEN     Performed at [facility]   Culture     Final   Value: CULTURE WILL BE EXAMINED FOR 6 WEEKS BEFORE ISSUING A FINAL REPORT     Performed at [facility]   Report Status PENDING   Incomplete  BODY FLUID CULTURE     Status: None   Collection Time    10/25/13 12:21 PM      Result Value Range Status   Specimen Description PERICARDIAL FLUID   Final   Special Requests NONE   Final   Gram Stain     Final   Value: FEW WBC PRESENT,BOTH PMN AND  MONONUCLEAR     NO ORGANISMS SEEN     Performed at [facility]   Culture     Final   Value: NO GROWTH 3 DAYS     Performed at [facility]   Report Status 10/28/2013 FINAL   Final     Studies:  Recent x-ray studies have been reviewed in detail by the Attending Physician  Scheduled Meds:  Scheduled Meds: . albuterol  2.5 mg Nebulization TID  . antiseptic oral rinse  15 mL Mouth Rinse BID  . feeding supplement (ENSURE COMPLETE)  237 mL Oral BID BM  . ipratropium  0.5 mg Nebulization TID  . levofloxacin  500 mg Oral Daily  . multivitamin with minerals  1 tablet Oral Daily    Time spent on care of this patient: 35 mins   [facility] T  [facility] Office  [phone] Pager - Text Page per Loretha Stapler as per below:  On-Call/Text Page:      [email or web address]      password [email or web address]  If 7PM-7AM, please contact night-coverage [email or web address] Password [email or web address] 10/28/2013, 1:19 PM   LOS: 4 days

## 2013-10-28 NOTE — Evaluation (Signed)
Physical Therapy Evaluation Patient Details Name: Philip Andrews MRN: [id number] DOB: 03-09-1956 Today's Date: 10/28/2013 Time: 4098-1191 PT Time Calculation (min): 31 min  PT Assessment / Plan / Recommendation History of Present Illness  Patient is a 57 y/o male admitted with SOB, found to have pericardial effusion with cardiac tampenade.  Was intubated 11/15-11/17.  Positive for left lung hilar mass.  Has history of polysubstance abuse, TBI with left frontal petechial conusion and subdural hematom/hygroma from 07/2007.  Clinical Impression  Patient presents with decreased mobility due to deficits listed below and will benefit from skilled PT in the acute setting to maximize independence in prep for d/c home following rehab stay.    PT Assessment  Patient needs continued PT services    Follow Up Recommendations  SNF    Does the patient have the potential to tolerate intense rehabilitation    N/A  Barriers to Discharge Decreased caregiver support      Equipment Recommendations  Other (comment) (to be assessed )    Recommendations for Other Services   None  Frequency Min 3X/week    Precautions / Restrictions Precautions Precautions: Fall   Pertinent Vitals/Pain No pain complaints      Mobility  Bed Mobility Bed Mobility: Sit to Supine Sit to Supine: 3: Mod assist Details for Bed Mobility Assistance: assisted for orientation to bed and lifted legs in bed Transfers Transfers: Sit to Stand;Stand to Sit Sit to Stand: From chair/3-in-1;With armrests;3: Mod assist Stand to Sit: 3: Mod assist;2: Max assist;To chair/3-in-1;To bed Details for Transfer Assistance: pt reluctant to sit on 3:1 despite bowel incontinence on the floor; needed max facilitation to sit safely; to bed after orientation to surface, much less resistance. Ambulation/Gait Ambulation/Gait Assistance: 3: Mod assist;2: Max Environmental consultant (Feet): 5 Feet Assistive device: Rolling walker;1 person hand  held assist Ambulation/Gait Assistance Details: assist for safety pt picking up walker and moving with poor awareness of space; wide BOS and increased anterior weight shift,  Assist to stop forward motion when pt became incontinent Gait Pattern: Decreased stride length;Decreased hip/knee flexion - right;Decreased hip/knee flexion - left;Wide base of support;Ataxic;Step-to pattern        PT Diagnosis: Abnormality of gait;Altered mental status  PT Problem List: Decreased cognition;Decreased activity tolerance;Decreased safety awareness;Decreased balance;Decreased mobility;Decreased coordination;Decreased knowledge of use of DME PT Treatment Interventions: DME instruction;Balance training;Gait training;Patient/family education;Functional mobility training;Therapeutic activities;Therapeutic exercise     PT Goals(Current goals can be found in the care plan section) Acute Rehab PT Goals PT Goal Formulation: Patient unable to participate in goal setting Time For Goal Achievement: 11/11/13 Potential to Achieve Goals: Fair  Visit Information  Last PT Received On: 10/28/13 Assistance Needed: +2 History of Present Illness: Patient is a 57 y/o male admitted with SOB, found to have pericardial effusion with cardiac tampenade.  Was intubated 11/15-11/17.  Positive for left lung hilar mass.  Has history of polysubstance abuse, TBI with left frontal petechial conusion and subdural hematom/hygroma from 07/2007.       Prior Functioning  Home Living Family/patient expects to be discharged to:: Private residence Living Arrangements: Non-relatives/Friends Available Help at Discharge: Available PRN/intermittently;Friend(s) Type of Home: House Home Access: Level entry Home Layout: Able to live on main level with bedroom/bathroom Home Equipment: None Additional Comments: Lives in basement of home of lady he does house chores for.   Prior Function Level of Independence: Independent Comments: doesnt drive     Cognition  Cognition Arousal/Alertness: Awake/alert Behavior During Therapy: Impulsive Overall Cognitive  Status: Impaired/Different from baseline Area of Impairment: Safety/judgement;Attention;Problem solving;Following commands Current Attention Level: Focused Following Commands: Follows one step commands with increased time Safety/Judgement: Decreased awareness of deficits;Decreased awareness of safety Problem Solving: Difficulty sequencing;Requires verbal cues;Requires tactile cues;Slow processing General Comments: perseverates on wanting chicken despite dire issues of bowl incontenence and imbalance    Extremity/Trunk Assessment Lower Extremity Assessment Lower Extremity Assessment: Generalized weakness   Balance Balance Balance Assessed: Yes Static Sitting Balance Static Sitting - Balance Support: Feet supported;Bilateral upper extremity supported Static Sitting - Level of Assistance: 4: Min assist;3: Mod assist Static Sitting - Comment/# of Minutes: Leaning heavily forward sitting on 3:1 with elbows on knees, assist to improve upright sitting Static Standing Balance Static Standing - Balance Support: No upper extremity supported Static Standing - Level of Assistance: 4: Min assist;3: Mod assist Static Standing - Comment/# of Minutes: standing on blankets on floor due to floor soiled, pt needed cues to use walker, but continued to use unsafely, took walker away and with wide BOS pt able to balance with min to mod assist  End of Session PT - End of Session Equipment Utilized During Treatment: Gait belt Activity Tolerance: Patient limited by lethargy Patient left: in bed;with call bell/phone within reach;with bed alarm set  GP     [facility] 10/28/2013, 3:38 PM [place], PT [phone] 10/28/2013

## 2013-10-29 ENCOUNTER — Inpatient Hospital Stay (HOSPITAL_COMMUNITY): Payer: Medicaid Other

## 2013-10-29 DIAGNOSIS — R51 Headache: Secondary | ICD-10-CM

## 2013-10-29 DIAGNOSIS — I309 Acute pericarditis, unspecified: Secondary | ICD-10-CM

## 2013-10-29 LAB — BLOOD GAS, ARTERIAL
Acid-base deficit: 1.2 mmol/L (ref 0.0–2.0)
Bicarbonate: 21.9 mEq/L (ref 20.0–24.0)
Drawn by: 28337
FIO2: 0.21 %
O2 Saturation: 94.6 %
Patient temperature: 98.6
TCO2: 22.9 mmol/L (ref 0–100)
pCO2 arterial: 30.1 mmHg — ABNORMAL LOW (ref 35.0–45.0)
pH, Arterial: 7.475 — ABNORMAL HIGH (ref 7.350–7.450)
pO2, Arterial: 70.6 mmHg — ABNORMAL LOW (ref 80.0–100.0)

## 2013-10-29 LAB — COMPREHENSIVE METABOLIC PANEL
ALT: 833 U/L — ABNORMAL HIGH (ref 0–53)
ALT: 760 U/L — ABNORMAL HIGH (ref 0–53)
AST: 342 U/L — ABNORMAL HIGH (ref 0–37)
AST: 254 U/L — ABNORMAL HIGH (ref 0–37)
Albumin: 2.7 g/dL — ABNORMAL LOW (ref 3.5–5.2)
Alkaline Phosphatase: 109 U/L (ref 39–117)
Alkaline Phosphatase: 116 U/L (ref 39–117)
BUN: 9 mg/dL (ref 6–23)
CO2: 22 mEq/L (ref 19–32)
Calcium: 8.4 mg/dL (ref 8.4–10.5)
Calcium: 8.5 mg/dL (ref 8.4–10.5)
Chloride: 98 mEq/L (ref 96–112)
Creatinine, Ser: 0.6 mg/dL (ref 0.50–1.35)
GFR calc Af Amer: >90 mL/min (ref >90)
GFR calc non Af Amer: >90 mL/min (ref >90)
Glucose, Bld: 115 mg/dL — ABNORMAL HIGH (ref 70–99)
Potassium: 3.5 mEq/L (ref 3.5–5.1)
Potassium: 3.3 mEq/L — ABNORMAL LOW (ref 3.5–5.1)
Sodium: 133 mEq/L — ABNORMAL LOW (ref 135–145)
Sodium: 132 mEq/L — ABNORMAL LOW (ref 135–145)
Total Bilirubin: 0.7 mg/dL (ref 0.3–1.2)
Total Protein: 6.1 g/dL (ref 6.0–8.3)
Total Protein: 6.6 g/dL (ref 6.0–8.3)

## 2013-10-29 LAB — PROTIME-INR
INR: 1.21 (ref 0.00–1.49)
Prothrombin Time: 15 seconds (ref 11.6–15.2)

## 2013-10-29 LAB — CBC
HCT: 35.4 % — ABNORMAL LOW (ref 39.0–52.0)
HCT: 38.5 % — ABNORMAL LOW (ref 39.0–52.0)
Hemoglobin: 13.5 g/dL (ref 13.0–17.0)
MCH: 31.4 pg (ref 26.0–34.0)
MCH: 30.8 pg (ref 26.0–34.0)
MCHC: 35.6 g/dL (ref 30.0–36.0)
MCHC: 35.1 g/dL (ref 30.0–36.0)
MCV: 88.3 fL (ref 78.0–100.0)
MCV: 87.7 fL (ref 78.0–100.0)
Platelets: 323 10*3/uL (ref 150–400)
RBC: 4.01 MIL/uL — ABNORMAL LOW (ref 4.22–5.81)
RBC: 4.39 MIL/uL (ref 4.22–5.81)
RDW: 13 % (ref 11.5–15.5)
RDW: 13.3 % (ref 11.5–15.5)
WBC: 6.7 10*3/uL (ref 4.0–10.5)

## 2013-10-29 LAB — FERRITIN: Ferritin: 675 ng/mL — ABNORMAL HIGH (ref 22–322)

## 2013-10-29 LAB — URINALYSIS, ROUTINE W REFLEX MICROSCOPIC
Bilirubin Urine: NEGATIVE
Glucose, UA: 250 mg/dL — AB
Ketones, ur: 15 mg/dL — AB
Leukocytes, UA: NEGATIVE
Nitrite: NEGATIVE
Protein, ur: NEGATIVE mg/dL
Specific Gravity, Urine: 1.024 (ref 1.005–1.030)
Urobilinogen, UA: 0.2 mg/dL (ref 0.0–1.0)
pH: 8 (ref 5.0–8.0)

## 2013-10-29 LAB — IRON AND TIBC
Iron: 29 ug/dL — ABNORMAL LOW (ref 42–135)
Saturation Ratios: 20 % (ref 20–55)
TIBC: 148 ug/dL — ABNORMAL LOW (ref 215–435)
UIBC: 119 ug/dL — ABNORMAL LOW (ref 125–400)

## 2013-10-29 LAB — APTT
aPTT: 35 seconds (ref 24–37)
aPTT: 33 seconds (ref 24–37)

## 2013-10-29 LAB — URINE MICROSCOPIC-ADD ON

## 2013-10-29 LAB — TYPE AND SCREEN
ABO/RH(D): O POS
Antibody Screen: NEGATIVE

## 2013-10-29 LAB — RETICULOCYTES: Retic Count, Absolute: 120.3 10*3/uL (ref 19.0–186.0)

## 2013-10-29 MED ORDER — QUETIAPINE FUMARATE 50 MG PO TABS
50.0000 mg | ORAL_TABLET | Freq: Every day | ORAL | Status: DC
  Administered 2013-10-29 – 2013-11-02 (×5): 50 mg via ORAL
  Filled 2013-10-29 (×8): qty 1

## 2013-10-29 MED ORDER — ACETAMINOPHEN 325 MG PO TABS
650.0000 mg | ORAL_TABLET | Freq: Four times a day (QID) | ORAL | Status: DC | PRN
  Administered 2013-10-29 (×2): 650 mg via ORAL
  Filled 2013-10-29 (×2): qty 2

## 2013-10-29 MED ORDER — ESCITALOPRAM OXALATE 10 MG PO TABS
10.0000 mg | ORAL_TABLET | Freq: Every day | ORAL | Status: DC
  Administered 2013-10-29 – 2013-11-03 (×6): 10 mg via ORAL
  Filled 2013-10-29 (×7): qty 1

## 2013-10-29 MED ORDER — DEXTROSE 5 % IV SOLN
1.5000 g | INTRAVENOUS | Status: AC
  Administered 2013-10-30: 1.5 g via INTRAVENOUS
  Filled 2013-10-29: qty 1.5

## 2013-10-29 MED ORDER — IOHEXOL 300 MG/ML  SOLN
100.0000 mL | Freq: Once | INTRAMUSCULAR | Status: AC | PRN
  Administered 2013-10-29: 100 mL via INTRAVENOUS

## 2013-10-29 NOTE — Progress Notes (Signed)
IP PROGRESS NOTE  Subjective:   He is alert this morning. He complains of a headache and requests Tylenol.  Objective: Vital signs in last 24 hours: Blood pressure 125/82, pulse 89, temperature 98.8 F (37.1 C), temperature source Oral, resp. rate 18, height 5\' 9"  (1.753 m), weight 136 lb 8 oz (61.916 kg), SpO2 94.00%.  Intake/Output from previous day: 11/19 0701 - 11/20 0700 In: 720 [P.O.:720] Out: 2300 [Urine:2300]  Physical Exam:   Lungs: Scattered bronchial sounds, no respiratory distress Cardiac: Regular rate and rhythm Abdomen: No hepatosplenomegaly Extremities: No leg edema Neurologic: Alert and oriented, follows commands    Lab Results:  Recent Labs  10/28/13 0730 10/29/13 0605  WBC 7.3 7.1  HGB 11.3* 12.6*  HCT 32.5* 35.4*  PLT 288 322    BMET  Recent Labs  10/28/13 0730 10/29/13 0605  NA 135 133*  K 3.7 3.5  CL 103 101  CO2 22 20  GLUCOSE 96 90  BUN 11 7  CREATININE 0.60 0.54  CALCIUM 8.4 8.4    Studies/Results: No results found.  Medications: I have reviewed the patient's current medications.  Assessment/Plan:  1. Metastatic adenocarcinoma involving a pericardial effusion  2. Respiratory failure/cardiac tamponade secondary to #1-resolved  3. History of polysubstance abuse  4. Acute renal failure-resolved  5. Mild normocytic anemia  6. Left hilar mass on the noncontrast chest CT 10/24/2013  7. Altered mental status-improved  He appears much more alert today and he is not confused. I discussed the diagnosis of metastatic adenocarcinoma with him. The immunohistochemical stains on the pleural fluid cytology did not define a lung primary. He will need additional diagnostic testing. The pericardial fluid may reaccumulate without a pericardial window. I recommend consulting CVTS to consider a pericardial window and bronchoscopy.   Recommendations:  1. Proceed with staging brain scan  2. Consult CVTS to consider a pericardial window  and diagnostic bronchoscopy  3. I will continue following him here and we will arrange for an outpatient appointment.   LOS: 5 days   Rachell Druckenmiller, Jillyn Hidden  10/29/2013, 7:47 AM

## 2013-10-29 NOTE — Progress Notes (Addendum)
[facility] Progress Note  Philip Andrews ZOX:[id number] DOB: 10/23/1956 DOA: 10/24/2013 PCP: No primary provider on file.  Assessment/Plan:  Acute respiratory failure 2nd to cardiac tamponade, required ventilatory support  -now resolved  Pericardial effusion with tamponade - essentially resolved after emergent drainage in ER per Cardiology service. -high risk for recurrent effusion due to malignant etiology -plan is for pericardial window by TCTS -oncology on board helping dictating further treatment.  Acute renal failure - resolved  Transaminitis ?EtOH abuse - ? Shock liver - recheck in AM - no evidence of mets of liver on CT -LFT's improving  Lt lower lung consolidation/presumed PNA Cont empiric abx to complete 7 day course  Will need 2 more days of levaquin  COPD/emphysema with bullous lung disease Well compensated at this time  -continue PRN neb  Lt perihilar mass - adenocarcinoma  Per Onc cytology is diagnostic of adenocarcinoma - awaiting immunohistochemical stains - pt will not likely be a candidate for any aggressive tx, but diagnosis needs to be confirmed. 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  Depression and delirium: -neg CT head for acute intracranial abnormalities -will treat with seroquel and start low dose lexapro  Code Status: FULL Family Communication: spoke w/ son and sister at length at bedside  Disposition Plan: to be  determined.  Consultants: PCCM >> [facility] Cardiology Oncology [facility]  Procedures: 11/15 Pericardiocentesis w/ pericardial drain placement >> 11/17 11/15 CT chest >> centrilobular emphysema, bullous changes, 18 mm mass LT hilum, b/l pleural effusions, ATX LLL  11/15 CT abd >> negative  11/15 ETT >>11/17 11/15 Echo >> EF 55 to 60%, mild MR, PAS 47 mmHg  11/16 Echo >> EF 65 to 70% 11/20 CT head >> no acute metastasis or intracranial abnormalities  Antibiotics: 11/15 Vanc >> 11/19 11/15 Zosyn >>11/19 11/19 levaquin  DVT prophylaxis: SCDs only   HPI/Subjective: Pt is alert, awake and oriented X 3; still with some intermittent episodes delirium according to family and really depressed with medical news.  Objective: Blood pressure 115/74, pulse 110, temperature 98 F (36.7 C), temperature source Oral, resp. rate 18, height 5\' 9"  (1.753 m), weight 61.916 kg (136 lb 8 oz), SpO2 97.00%.  Intake/Output Summary (Last 24 hours) at 10/29/13 1617 Last data filed at 10/29/13 1558  Gross per 24 hour  Intake    440 ml  Output   2350 ml  Net  -1910 ml   Exam: General: No acute respiratory distress Lungs: scattered rhonchi, otherwise CTA Cardiovascular: Regular rate and rhythm without murmur gallop or rub, normal S1 and S2 Abdomen: Nontender, nondistended, soft, bowel sounds positive, no rebound, no ascites, no appreciable mass Extremities: No significant cyanosis, clubbing, or edema of his bilateral lower extremities  Data Reviewed: Basic Metabolic Panel:  Recent Labs Lab 10/25/13 0430 10/26/13 0555 10/27/13 0512 10/28/13 0730 10/29/13 0605  NA 132* 138 136 135 133*  K 4.8 4.0 4.0 3.7 3.5  CL 98 105 105 103 101  CO2 24 25 24 22 20   GLUCOSE 103* 81 120* 96 90  BUN 83* 36* 21 11 7  CREATININE 2.04* 0.96 0.59 0.60 0.54  CALCIUM 7.4* 7.6* 7.8* 8.4 8.4  MG  --   --   --  1.9  --   PHOS  --   --   --  2.0*  --    Liver Function Tests:  Recent Labs Lab 10/24/13 1627  10/26/13 0555 10/29/13 0605  AST 3810* 2607* 342*  ALT 1600* 1987* 833*  ALKPHOS 122* 99 109  BILITOT 1.0 0.6 0.8  PROT 6.4 5.4* 6.1  ALBUMIN 3.1* 2.5* 2.5*   CBC:  Recent Labs Lab 10/24/13 1627 10/25/13 0430 10/26/13 0555 10/27/13 0512 10/28/13 0730 10/29/13 0605  WBC 13.8* 12.2* 9.2 6.6 7.3 7.1  NEUTROABS 11.7*  --   --   --   --   --   HGB 11.8* 10.7* 10.4* 10.4* 11.3* 12.6*  HCT 34.1* 30.7* 30.5* 30.7* 32.5* 35.4*  MCV 92.4 89.8 91.6 91.6 90.5 88.3  PLT 261 293 255 236 288 322   Cardiac Enzymes:  Recent Labs Lab 10/24/13 1549 10/24/13 2323 10/25/13 0349 10/25/13 0900  TROPONINI <0.30 <0.30 <0.30 <0.30   CBG:  Recent Labs Lab 10/25/13 2037 10/26/13 0734 10/26/13 0735 10/26/13 0949 10/26/13 1145  GLUCAP 96 69* 71 73 71    Recent Results (from the past 240 hour(s))  MRSA PCR SCREENING     Status: None   Collection Time    10/24/13  6:30 PM      Result Value Range Status   MRSA by PCR NEGATIVE  NEGATIVE Final   Comment:            The GeneXpert MRSA Assay (FDA     approved for NASAL specimens     only), is one component of a     comprehensive MRSA colonization     surveillance program. It is not     intended to diagnose MRSA     infection nor to guide or     monitor treatment for     MRSA infections.  AFB CULTURE WITH SMEAR     Status: None   Collection Time    10/25/13 12:21 PM      Result Value Range Status   Specimen Description PERICARDIAL FLUID   Final   Special Requests NONE   Final   ACID FAST SMEAR     Final   Value: NO ACID FAST BACILLI SEEN     Performed at [facility]   Culture     Final   Value: CULTURE WILL BE EXAMINED FOR 6 WEEKS BEFORE ISSUING A FINAL REPORT     Performed at [facility]   Report Status PENDING   Incomplete  BODY FLUID CULTURE     Status: None   Collection Time    10/25/13 12:21 PM      Result Value Range Status   Specimen Description PERICARDIAL FLUID   Final   Special Requests NONE    Final   Gram Stain     Final   Value: FEW WBC PRESENT,BOTH PMN AND MONONUCLEAR     NO ORGANISMS SEEN     Performed at [facility]   Culture     Final   Value: NO GROWTH 3 DAYS     Performed at [facility]   Report Status 10/28/2013 FINAL   Final     Scheduled Meds:  Scheduled Meds: . albuterol  2.5 mg Nebulization TID  . antiseptic oral rinse  15 mL Mouth Rinse BID  .  feeding supplement (ENSURE COMPLETE)  237 mL Oral BID BM  . ipratropium  0.5 mg Nebulization TID  . levofloxacin  500 mg Oral Daily  . multivitamin with minerals  1 tablet Oral Daily    Time spent on care of this patient: 35 mins   [facility]  [facility] Office  [phone] Pager - [phone]  10/29/2013, 4:17 PM   LOS: 5 days

## 2013-10-29 NOTE — Consult Note (Signed)
[address]       [place] [address]             [phone]          EUCLID CASSETTA [facility] Medical Record #[id number] Date of Birth: Apr 12, 1956  Referring: No ref. provider found Primary Care: No primary provider on file.  Chief Complaint:    Chief Complaint  Patient presents with  . Chest Pain     History of Present Illness:     The patient is a 57 year old male who was brought by EMS to the emergency department at [facility] with a preliminary diagnosis of Code STEMI. He had complained of diffuse upper abdominal pain for several days prior to admission, then developed chest pain on the afternoon of admission with associated weakness.  On exam, he was noted to have tachycardia and hypotension, with significant JVD.  EKG showed borderline ST elevation in the anterior and lateral leads, with prolonged QT interval, questionable pericarditis.  Urine drug screen was positive for cocaine.  Cardiology was consulted, and Dr. Katrinka Blazing saw the patient.  Bedside ultrasound was performed, and he was noted to have a massive pericardial effusion.  He failed to respond to fluid resuscitation and ultimately required intubation.  Dr. Katrinka Blazing performed an emergent pericardiocentesis at the bedside, draining approximately 900 cc of bloody fluid. A drain was left in place.  His blood pressure subsequently improved and his tachycardia resolved. He was admitted to the ICU for further management.  His respiratory status stabilized, and he was extubated on 11/17.  He was noted to have toxic encephalopathy of unclear etiology, but this is resolving.  He also had elevated transaminases as well as acute renal failure on admission, and all are resolving.  A CT of the chest was performed, which showed an 8 x 17 x 13 mm mass at the superior aspect of the left hilum, small to moderate pleural effusions, and a small pericardial effusion. An echocardiogram was also performed, and this revealed only a  small residual effusion.  There was no further output from the pericardial drain, and it was removed on 11/17.   Fluid cytology was positive for malignant cells, consistent with adenocarcinoma.  CT of the abdomen and pelvis without contrast was negative with the exception of a small amount of free fluid in the pelvis.   Oncology was consulted, and it was felt that the likely source of his primary carcinoma was lung.  Immunohistochemical stains were performed on the pericardial fluid, but these did not define a source for primary. It was feared that his pericardial effusion might reaccumulate without further intervention, therefore TCTS has been consulted for possible pericardial window, as well as diagnostic bronchoscopy to evaluate his left hilar mass.  The patient does note that for several months prior to presentation, he has experienced "heartburn" type symptoms, mainly a feeling of tightness in his chest. He has been taking OTC antacids and pain relievers.  He also notes a subjective weight loss and cough, but denies fevers, chills, hemoptysis.    Past Medical History: Past Medical History  Diagnosis Date  . Substance abuse     per H&P   Perforated peptic ulcer -2004  Traumatic brain injury with small subdural hematoma/hygroma and small left frontal intracerebral petechial contusions - 07/2007.   Surgical History:  Exploratory lap with patch closure of ulcer by Dr. Daphine Deutscher - 2004  Repair of multiple lacerations    Social  History:  Divorced, 3 previous marriages. 2 children.  Currently lives with an 43 year old "companion".  Unemployed, has had several incarcerations.  Smokes 2 ppd since early 1970s  Drinks at least 6 beers/day, some liquor  Polysubstance abuse including heroin and cocaine.  Most recently smoked crack the week of admission.    Allergies: Allergies  Allergen Reactions  . Benadryl [Diphenhydramine Hcl]     Pt states he just knows he's allergic-it broke him  out     Medications: Current Facility-Administered Medications  Medication Dose Route Frequency Provider Last Rate Last Dose  . 0.9 %  sodium chloride infusion   Intravenous Continuous Storm Frisk, MD      . acetaminophen (TYLENOL) tablet 650 mg  650 mg Oral Q6H PRN Ladene Artist, MD   650 mg at 10/29/13 0816  . albuterol (PROVENTIL) (5 MG/ML) 0.5% nebulizer solution 2.5 mg  2.5 mg Nebulization Q2H PRN Coralyn Helling, MD      . albuterol (PROVENTIL) (5 MG/ML) 0.5% nebulizer solution 2.5 mg  2.5 mg Nebulization TID Storm Frisk, MD   2.5 mg at 10/29/13 0753  . antiseptic oral rinse (BIOTENE) solution 15 mL  15 mL Mouth Rinse BID Storm Frisk, MD   15 mL at 10/29/13 1610  . feeding supplement (ENSURE COMPLETE) (ENSURE COMPLETE) liquid 237 mL  237 mL Oral BID BM Ashley Jacobs, RD   237 mL at 10/28/13 1000  . ipratropium (ATROVENT) nebulizer solution 0.5 mg  0.5 mg Nebulization TID Storm Frisk, MD   0.5 mg at 10/29/13 0753  . levofloxacin (LEVAQUIN) tablet 500 mg  500 mg Oral Daily Alyson Reedy, MD   500 mg at 10/28/13 1007  . LORazepam (ATIVAN) injection 1-4 mg  1-4 mg Intravenous Q4H PRN Coralyn Helling, MD   4 mg at 10/27/13 2153  . multivitamin with minerals tablet 1 tablet  1 tablet Oral Daily Ashley Jacobs, RD   1 tablet at 10/28/13 1007   Home Medications: No prescriptions prior to admission     Family History: Father deceased, complications from alcohol abuse Mother deceased, "old age" in her 77s 12 siblings- one sister deceased, metastatic esophageal cancer; one sister with heart disease and recent stent placement Otherwise denies CAD, CVA, Ca, lung problems, diabetes    Review of Systems:     Cardiac Review of Systems: Y or N  Chest Pain [    ]  Resting SOB [   ] Exertional SOB  [  ]  Orthopnea [  ]   Pedal Edema [   ]    Palpitations [  ] Syncope  [  ]   Presyncope [   ]  General Review of Systems:  Constitional: recent weight change [ X ]; anorexia [   ]; fatigue [  ]; nausea [  ]; night sweats [  ]; fever [  ]; or chills [  ];  Dental: poor dentition[ x ];   Eye : blurred vision [  ]; diplopia [   ]; vision changes [  ];  Amaurosis fugax[  ]; Resp: cough [ x ];  wheezing[  ];  hemoptysis[  ]; shortness of breath[  ]; paroxysmal nocturnal dyspnea[  ]; dyspnea on exertion[  ]; or orthopnea[  ];  GI:  gallstones[  ], vomiting[  ];  dysphagia[  ]; melena[  ];  hematochezia [  ]; heartburn[  ];   Hx of  Colonoscopy[  ]; GU: kidney stones [  ]; hematuria[  ];   dysuria [  ];  nocturia[  ];  history of     obstruction [  ];             Skin: rash, swelling[  ];, hair loss[  ];  peripheral edema[  ];  or itching[  ]; Musculosketetal: myalgias[  ];  joint swelling[  ];  joint erythema[  ];  joint pain[  ];  back pain[  ];  Heme/Lymph: bruising[  ];  bleeding[  ];  anemia[  ];  Neuro: TIA[  ];  headaches[  ];  stroke[  ];  vertigo[  ];  seizures[  ];   paresthesias[  ];  difficulty walking[  ];  Psych:depression[  ]; anxiety[  ];  Endocrine: diabetes[  ];  thyroid dysfunction[  ];  Immunizations: Flu [  ]; Pneumococcal[  ];      Physical Exam: BP 125/82  Pulse 89  Temp(Src) 98.8 F (37.1 C) (Oral)  Resp 18  Ht 5\' 9"  (1.753 m)  Wt 136 lb 8 oz (61.916 kg)  BMI 20.15 kg/m2  SpO2 96%  General appearance: alert, cooperative and no distress Neurologic: intact Heart: regular rate and rhythm Lungs: clear to auscultation bilaterally Abdomen: Soft, nontender, nondistended, +BS, no masses or hepatosplenomegaly Extremities: no edema, redness or tenderness in the calves or thighs       Recent Radiology Findings: CT Chest (10/24/2013) - FINDINGS:  The patient has extensive emphysema in the upper lobes. There is a  poorly defined 18 x 17 x 13 mm mass at the superior aspect of the  left hilum. There is fullness  at the inferior aspect of the left  hilum which may represent a mass is well but the detail is poor  because of the lack of body fat and lack of intravenous contrast.  There are small to moderate bilateral pleural effusions. There is  atelectasis and partial consolidation in the left lower lobe and  atelectasis in the right lower lobe.  There is a catheter in the pericardium with only a small amount of  residual pericardial effusion.  Endotracheal tube appears in good position. No acute osseous  abnormality.  IMPRESSION:  1. Mass lesion at the superior aspect of the left hilum and possible  mass lesion at the inferior aspect of the left hilum.  2. Small to moderate bilateral pleural effusions. Atelectasis and  consolidation in the left lower lobe.  3. Small residual pericardial effusion. Pericardial drain in place.  Extensive emphysema.   Chest x-ray (10/29/2013) - FINDINGS:  Mild cardiac enlargement is stable. The vascular pattern is normal.  There is hazy attenuation over the lower 3rd of the left lungs.  There is hazy attenuation over the right lung base. These findings  do not appear to be significantly different when compared to the  prior study. The lateral view confirms the presence of small right  and  moderate left pleural effusion with underlying consolidation  likely representing passive atelectasis.  IMPRESSION:  Bilateral pleural effusions  Echo (10/25/2013) - Study Conclusions - Left ventricle: The cavity size was normal. Wall thickness was normal. Systolic function was vigorous. The estimated ejection fraction was in the range of 65% to 70%. Wall motion was normal; there were no regional wall motion abnormalities. - Tricuspid valve: Moderate regurgitation. - Pulmonary arteries: PA peak pressure: 39mm Hg (S). - Pericardium, extracardiac: There was a left pleural effusion. Transthoracic echocardiography. Limited 2D, limited spectral Doppler, and color Doppler.  Height: Height: 175.3cm. Height: 69in. Weight: Weight: 63.7kg. Weight: 140.1lb. Body mass index: BMI: 20.7kg/m^2. Body surface area: BSA: 1.62m^2. Blood pressure: 90/48. Patient status: Inpatient. Location: ICU/CCU  CT Head (10/30/2013)- FINDINGS:  Mild paranasal sinus mucosal thickening. Mastoids and tympanic  cavities are clear.  Stable visualized osseous structures.  Visualized orbits and scalp soft tissues are within normal limits.  Cerebral volume loss since 2008 with associated mild ventricular  enlargement. No ventriculomegaly. No midline shift, mass effect, or  evidence of intracranial mass lesion. No acute intracranial  hemorrhage identified. No residual extra-axial collection  identified. There a small area of cortical encephalomalacia at the  right vertex noted on series 4, image 25. No abnormal enhancement  identified. Grossly normal enhancement of the major intracranial  vascular structures.  IMPRESSION:  1. No acute or metastatic intracranial abnormality identified.  2. Mild generalized cerebral volume loss following the traumatic  brain injury in 2008.    Recent Lab Findings: Lab Results  Component Value Date   WBC 7.1 10/29/2013   HGB 12.6* 10/29/2013   HCT 35.4* 10/29/2013   PLT 322 10/29/2013   GLUCOSE 90 10/29/2013   ALT 833* 10/29/2013   AST 342* 10/29/2013   NA 133* 10/29/2013   K 3.5 10/29/2013   CL 101 10/29/2013   CREATININE 0.54 10/29/2013   BUN 7 10/29/2013   CO2 20 10/29/2013   INR 2.50* 10/24/2013      Assessment / Plan:   The patient is a 57 year male who presented with cardiac tamponade and shock from a large malignant pericardial effusion, which was drained via emergency pericardiocentesis. Chest CT showed a left hilar mass, suspicious for lung cancer.  Pericardial fluid was positive for adenocarcinoma, but immunohistochemical stains did not confirm a lung primary source.  He is currently stable, and a followup echo revealed only a small  residual effusion.  It is felt that he will require a diagnostic bronchoscopy, as well as a pericardial window to prevent a recurrent effusion.  Dr. Donata Clay will see the patient today and he will likely perform these procedures on Friday, 10/30/2013.   COLLINS,GINA H 10/29/2013 9:33 AM agree with above note by G. Collins  Patient examined, echocardiograms chest CT scan and medical record reviewed. Patient presented in extremis and cardiac tamponade. Emergency pericardiocentesis removed approximately a liter of bloody fluid cytologies positive for adenocarcinoma. LV function is fairly normal. CT scan of chest demonstrates a left hilar mass suspicious for carcinoma. The patient's oncologist is recommended a pericardial window to prevent reaccumulation of the malignant effusion with combined bronchoscopy and biopsy of the left lung.  Reviewed the situation the patient and discussed the procedure of subxiphoid pericardial window and video bronchoscopy and biopsy he understands the reasons for the procedure, the alternatives, and expected recovery, and the potential risks of bleeding, prolonged ventilator dependence, infection, recurrent pericardial effusion, and agrees to proceed.

## 2013-10-30 ENCOUNTER — Encounter (HOSPITAL_COMMUNITY): Payer: Medicaid Other | Admitting: Anesthesiology

## 2013-10-30 ENCOUNTER — Inpatient Hospital Stay (HOSPITAL_COMMUNITY): Payer: Medicaid Other | Admitting: Anesthesiology

## 2013-10-30 ENCOUNTER — Inpatient Hospital Stay (HOSPITAL_COMMUNITY): Payer: Medicaid Other

## 2013-10-30 ENCOUNTER — Encounter (HOSPITAL_COMMUNITY)
Admission: EM | Disposition: A | Discharge status: Home / Self Care | Payer: Self-pay | Source: Home / Self Care | Attending: Internal Medicine

## 2013-10-30 ENCOUNTER — Encounter (HOSPITAL_COMMUNITY): Payer: Self-pay | Admitting: Certified Registered"

## 2013-10-30 DIAGNOSIS — I309 Acute pericarditis, unspecified: Secondary | ICD-10-CM

## 2013-10-30 HISTORY — PX: VIDEO BRONCHOSCOPY: SHX5072

## 2013-10-30 HISTORY — PX: SUBXYPHOID PERICARDIAL WINDOW: SHX5075

## 2013-10-30 SURGERY — CREATION, PERICARDIAL WINDOW, SUBXIPHOID APPROACH
Anesthesia: General | Site: Chest | Wound class: Clean

## 2013-10-30 MED ORDER — DEXTROSE-NACL 5-0.45 % IV SOLN
INTRAVENOUS | Status: DC
  Administered 2013-10-30: 75 mL/h via INTRAVENOUS

## 2013-10-30 MED ORDER — PROPOFOL 10 MG/ML IV BOLUS
INTRAVENOUS | Status: DC | PRN
  Administered 2013-10-30: 160 mg via INTRAVENOUS

## 2013-10-30 MED ORDER — LACTATED RINGERS IV SOLN
INTRAVENOUS | Status: DC | PRN
  Administered 2013-10-30: 17:00:00 via INTRAVENOUS

## 2013-10-30 MED ORDER — OXYCODONE HCL 5 MG/5ML PO SOLN: 5.0000 mg | Freq: Once | ORAL | Status: DC | PRN

## 2013-10-30 MED ORDER — BISACODYL 5 MG PO TBEC: 10.0000 mg | DELAYED_RELEASE_TABLET | Freq: Every day | ORAL | Status: DC

## 2013-10-30 MED ORDER — POTASSIUM CHLORIDE 10 MEQ/50ML IV SOLN
10.0000 meq | Freq: Every day | INTRAVENOUS | Status: DC | PRN
  Filled 2013-10-30: qty 50

## 2013-10-30 MED ORDER — ACETAMINOPHEN 160 MG/5ML PO SOLN
1000.0000 mg | Freq: Four times a day (QID) | ORAL | Status: AC
  Filled 2013-10-30 (×4): qty 40

## 2013-10-30 MED ORDER — 0.9 % SODIUM CHLORIDE (POUR BTL) OPTIME
TOPICAL | Status: DC | PRN
  Administered 2013-10-30: 1000 mL

## 2013-10-30 MED ORDER — FENTANYL CITRATE 0.05 MG/ML IJ SOLN
INTRAMUSCULAR | Status: DC | PRN
  Administered 2013-10-30 (×3): 50 ug via INTRAVENOUS

## 2013-10-30 MED ORDER — GLYCOPYRROLATE 0.2 MG/ML IJ SOLN
INTRAMUSCULAR | Status: DC | PRN
  Administered 2013-10-30: 0.4 mg via INTRAVENOUS

## 2013-10-30 MED ORDER — OXYCODONE-ACETAMINOPHEN 5-325 MG PO TABS
1.0000 | ORAL_TABLET | ORAL | Status: DC | PRN
  Administered 2013-10-31 – 2013-11-02 (×4): 1 via ORAL
  Filled 2013-10-30: qty 1
  Filled 2013-10-30: qty 2
  Filled 2013-10-30 (×3): qty 1

## 2013-10-30 MED ORDER — OXYCODONE HCL 5 MG PO TABS: 5.0000 mg | ORAL_TABLET | Freq: Once | ORAL | Status: DC | PRN

## 2013-10-30 MED ORDER — OXYCODONE HCL 5 MG PO TABS
5.0000 mg | ORAL_TABLET | ORAL | Status: AC | PRN
  Filled 2013-10-30: qty 2

## 2013-10-30 MED ORDER — ONDANSETRON HCL 4 MG/2ML IJ SOLN
INTRAMUSCULAR | Status: DC | PRN
  Administered 2013-10-30: 4 mg via INTRAVENOUS

## 2013-10-30 MED ORDER — ARTIFICIAL TEARS OP OINT
TOPICAL_OINTMENT | OPHTHALMIC | Status: DC | PRN
  Administered 2013-10-30: 1 via OPHTHALMIC

## 2013-10-30 MED ORDER — HYDROMORPHONE HCL PF 1 MG/ML IJ SOLN: 0.2500 mg | INTRAMUSCULAR | Status: DC | PRN

## 2013-10-30 MED ORDER — ACETAMINOPHEN 500 MG PO TABS
1000.0000 mg | ORAL_TABLET | Freq: Four times a day (QID) | ORAL | Status: AC
  Administered 2013-10-30 – 2013-10-31 (×3): 1000 mg via ORAL
  Filled 2013-10-30 (×4): qty 2

## 2013-10-30 MED ORDER — MIDAZOLAM HCL 5 MG/5ML IJ SOLN
INTRAMUSCULAR | Status: DC | PRN
  Administered 2013-10-30: 2 mg via INTRAVENOUS

## 2013-10-30 MED ORDER — MORPHINE SULFATE 2 MG/ML IJ SOLN
2.0000 mg | INTRAMUSCULAR | Status: DC | PRN
  Administered 2013-10-30: 2 mg via INTRAVENOUS
  Filled 2013-10-30: qty 1

## 2013-10-30 MED ORDER — ROCURONIUM BROMIDE 100 MG/10ML IV SOLN
INTRAVENOUS | Status: DC | PRN
  Administered 2013-10-30: 10 mg via INTRAVENOUS
  Administered 2013-10-30: 40 mg via INTRAVENOUS

## 2013-10-30 MED ORDER — PHENYLEPHRINE HCL 10 MG/ML IJ SOLN
10.0000 mg | INTRAVENOUS | Status: DC | PRN
  Administered 2013-10-30: 100 ug/min via INTRAVENOUS

## 2013-10-30 MED ORDER — VECURONIUM BROMIDE 10 MG IV SOLR
INTRAVENOUS | Status: DC | PRN
  Administered 2013-10-30: 2 mg via INTRAVENOUS

## 2013-10-30 MED ORDER — NEOSTIGMINE METHYLSULFATE 1 MG/ML IJ SOLN
INTRAMUSCULAR | Status: DC | PRN
  Administered 2013-10-30: 3 mg via INTRAVENOUS

## 2013-10-30 MED ORDER — LIDOCAINE HCL (CARDIAC) 20 MG/ML IV SOLN
INTRAVENOUS | Status: DC | PRN
  Administered 2013-10-30: 100 mg via INTRAVENOUS

## 2013-10-30 MED ORDER — DEXTROSE 5 % IV SOLN
1.5000 g | Freq: Three times a day (TID) | INTRAVENOUS | Status: AC
  Administered 2013-10-30 – 2013-10-31 (×2): 1.5 g via INTRAVENOUS
  Filled 2013-10-30 (×2): qty 1.5

## 2013-10-30 SURGICAL SUPPLY — 69 items
ADH SKN CLS APL DERMABOND .7 (GAUZE/BANDAGES/DRESSINGS) ×2
APL SKNCLS STERI-STRIP NONHPOA (GAUZE/BANDAGES/DRESSINGS) ×2
ATTRACTOMAT 16X20 MAGNETIC DRP (DRAPES) ×4 IMPLANT
BALL CTTN LRG ABS STRL LF (GAUZE/BANDAGES/DRESSINGS)
BENZOIN TINCTURE PRP APPL 2/3 (GAUZE/BANDAGES/DRESSINGS) ×4 IMPLANT
BRUSH CYTOL CELLEBRITY 1.5X140 (MISCELLANEOUS) IMPLANT
CANISTER SUCTION 2500CC (MISCELLANEOUS) ×8 IMPLANT
CATH THORACIC 28FR (CATHETERS) IMPLANT
CATH THORACIC 28FR RT ANG (CATHETERS) IMPLANT
CATH THORACIC 36FR (CATHETERS) IMPLANT
CATH THORACIC 36FR RT ANG (CATHETERS) IMPLANT
CLOSURE WOUND 1/2 X4 (GAUZE/BANDAGES/DRESSINGS) ×1
CONT SPEC 4OZ CLIKSEAL STRL BL (MISCELLANEOUS) ×4 IMPLANT
COTTONBALL LRG STERILE PKG (GAUZE/BANDAGES/DRESSINGS) IMPLANT
COVER SURGICAL LIGHT HANDLE (MISCELLANEOUS) ×8 IMPLANT
COVER TABLE BACK 60X90 (DRAPES) ×4 IMPLANT
DERMABOND ADVANCED (GAUZE/BANDAGES/DRESSINGS) ×2
DERMABOND ADVANCED .7 DNX12 (GAUZE/BANDAGES/DRESSINGS) IMPLANT
DRAIN CHANNEL 28F RND 3/8 FF (WOUND CARE) ×4 IMPLANT
DRAPE LAPAROSCOPIC ABDOMINAL (DRAPES) ×4 IMPLANT
DRAPE PROXIMA HALF (DRAPES) ×4 IMPLANT
DRSG AQUACEL AG ADV 3.5X14 (GAUZE/BANDAGES/DRESSINGS) ×4 IMPLANT
ELECT REM PT RETURN 9FT ADLT (ELECTROSURGICAL) ×4
ELECTRODE REM PT RTRN 9FT ADLT (ELECTROSURGICAL) ×2 IMPLANT
FORCEPS BIOP RJ4 1.8 (CUTTING FORCEPS) IMPLANT
GLOVE BIO SURGEON STRL SZ 6 (GLOVE) ×4 IMPLANT
GLOVE BIO SURGEON STRL SZ7.5 (GLOVE) ×16 IMPLANT
GLOVE ORTHO TXT STRL SZ7.5 (GLOVE) ×3 IMPLANT
HEMOSTAT POWDER SURGIFOAM 1G (HEMOSTASIS) IMPLANT
KIT BASIN OR (CUSTOM PROCEDURE TRAY) ×4 IMPLANT
KIT ROOM TURNOVER OR (KITS) ×8 IMPLANT
MARKER SKIN DUAL TIP RULER LAB (MISCELLANEOUS) ×4 IMPLANT
NDL BIOPSY TRANSBRONCH 21G (NEEDLE) IMPLANT
NDL BLUNT 18X1 FOR OR ONLY (NEEDLE) IMPLANT
NEEDLE 22X1 1/2 (OR ONLY) (NEEDLE) IMPLANT
NEEDLE BIOPSY TRANSBRONCH 21G (NEEDLE) IMPLANT
NEEDLE BLUNT 18X1 FOR OR ONLY (NEEDLE) IMPLANT
NS IRRIG 1000ML POUR BTL (IV SOLUTION) ×8 IMPLANT
OIL SILICONE PENTAX (PARTS (SERVICE/REPAIRS)) IMPLANT
PACK CHEST (CUSTOM PROCEDURE TRAY) ×4 IMPLANT
PAD ARMBOARD 7.5X6 YLW CONV (MISCELLANEOUS) ×16 IMPLANT
PAD ELECT DEFIB RADIOL ZOLL (MISCELLANEOUS) ×4 IMPLANT
SPONGE GAUZE 4X4 12PLY (GAUZE/BANDAGES/DRESSINGS) ×3 IMPLANT
STRIP CLOSURE SKIN 1/2X4 (GAUZE/BANDAGES/DRESSINGS) ×3 IMPLANT
SUT SILK  1 MH (SUTURE) ×2
SUT SILK 1 MH (SUTURE) IMPLANT
SUT SILK 2 0 SH CR/8 (SUTURE) ×4 IMPLANT
SUT VIC AB 1 CTX 18 (SUTURE) ×4 IMPLANT
SUT VIC AB 1 CTX 36 (SUTURE) ×4
SUT VIC AB 1 CTX36XBRD ANBCTR (SUTURE) ×2 IMPLANT
SUT VIC AB 2-0 CTX 36 (SUTURE) ×2 IMPLANT
SUT VIC AB 3-0 X1 27 (SUTURE) ×4 IMPLANT
SWAB COLLECTION DEVICE MRSA (MISCELLANEOUS) IMPLANT
SYR 20ML ECCENTRIC (SYRINGE) ×4 IMPLANT
SYR 50ML SLIP (SYRINGE) IMPLANT
SYR 5ML LUER SLIP (SYRINGE) ×4 IMPLANT
SYR CONTROL 10ML LL (SYRINGE) IMPLANT
SYRINGE 10CC LL (SYRINGE) IMPLANT
SYSTEM SAHARA CHEST DRAIN ATS (WOUND CARE) IMPLANT
TAPE CLOTH SURG 4X10 WHT LF (GAUZE/BANDAGES/DRESSINGS) ×2 IMPLANT
TOWEL OR 17X24 6PK STRL BLUE (TOWEL DISPOSABLE) ×8 IMPLANT
TOWEL OR 17X26 10 PK STRL BLUE (TOWEL DISPOSABLE) ×12 IMPLANT
TRAP SPECIMEN MUCOUS 40CC (MISCELLANEOUS) ×4 IMPLANT
TRAY FOLEY CATH 14FRSI W/METER (CATHETERS) ×4 IMPLANT
TRAY FOLEY IC TEMP SENS 14FR (CATHETERS) ×4 IMPLANT
TUBE ANAEROBIC SPECIMEN COL (MISCELLANEOUS) IMPLANT
TUBE CONNECTING 12'X1/4 (SUCTIONS) ×1
TUBE CONNECTING 12X1/4 (SUCTIONS) ×3 IMPLANT
WATER STERILE IRR 1000ML POUR (IV SOLUTION) ×8 IMPLANT

## 2013-10-30 NOTE — Progress Notes (Signed)
PT Cancellation Note  Patient Details Name: Philip Andrews MRN: [id number] DOB: Feb 11, 1956   Cancelled Treatment:    Reason Eval/Treat Not Completed: Medical issues which prohibited therapy. Pt going for pericardial window this pm.  PT to sign off and will need reorder.  Please reorder as appropriate after surgery.  Thanks.     INGOLD,Delorse Shane 10/30/2013, 12:54 PM [facility] [phone] (pager)

## 2013-10-30 NOTE — OR Nursing (Signed)
17:10 - end of 1st procedure

## 2013-10-30 NOTE — Progress Notes (Signed)
[facility] Progress Note  Philip Andrews ZOX:[id number] DOB: 06-19-1956 DOA: 10/24/2013 PCP: No primary provider on file.  Assessment/Plan: Acute respiratory failure 2nd to cardiac tamponade, required ventilatory support  -now resolved  Pericardial effusion with tamponade  -essentially resolved after emergent drainage in ER per Cardiology service. -high risk for recurrent effusion due to malignant etiology -plan is for pericardial window by TCTS today (11/21) -oncology on board helping dictating further treatment.  Acute renal failure - resolved  Transaminitis ?EtOH abuse - ? Shock liver - recheck in AM - no evidence of mets of liver on CT -LFT's improving  Lt lower lung consolidation/presumed PNA Cont empiric abx to complete 7 day course  Will need 1 more days of levaquin  COPD/emphysema with bullous lung disease Well compensated at this time  -continue PRN neb  Lt perihilar mass - adenocarcinoma  Per Onc cytology is diagnostic of adenocarcinoma - awaiting immunohistochemical stains - pt will not likely be a candidate for any aggressive tx, but diagnosis needs to be confirmed. 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  Depression and delirium: -neg CT head for acute intracranial abnormalities (including mets) -will cont tx with seroquel and low dose lexapro -mentation continue to improve  Physical  deconditioning: -PT recommending SNF at discharge  Code Status: FULL Family Communication: spoke w/ son and sister at length at bedside  Disposition Plan: to be determined.  Consultants: PCCM >> [facility] Cardiology Oncology TCTS  Procedures: 11/15 Pericardiocentesis w/ pericardial drain placement >> 11/17 11/15 CT chest >> centrilobular emphysema, bullous changes, 18 mm mass LT hilum, b/l pleural effusions, ATX LLL  11/15 CT abd >> negative  11/15 ETT >>11/17 11/15 Echo >> EF 55 to 60%, mild MR, PAS 47 mmHg  11/16 Echo >> EF 65 to 70% 11/20 CT head >> no acute metastasis or intracranial abnormalities  Antibiotics: 11/15 Vanc >> 11/19 11/15 Zosyn >>11/19 11/19 levaquin  DVT prophylaxis: SCDs only   HPI/Subjective: Pt is alert, awake and oriented X 4; afebrile; good insight on condition and reason for admission.  Objective: Blood pressure 125/78, pulse 89, temperature 98.2 F (36.8 C), temperature source Oral, resp. rate 16, height 5\' 9"  (1.753 m), weight 59.33 kg (130 lb 12.8 oz), SpO2 98.00%.  Intake/Output Summary (Last 24 hours) at 10/30/13 1224 Last data filed at 10/30/13 0841  Gross per 24 hour  Intake    422 ml  Output    150 ml  Net    272 ml   Exam: General: No acute respiratory distress Lungs: scattered rhonchi, otherwise CTA Cardiovascular: Regular rate and rhythm without murmur gallop or rub, normal S1 and S2 Abdomen: Nontender, nondistended, soft, bowel sounds positive, no rebound, no ascites, no appreciable mass Extremities: No significant cyanosis, clubbing, or edema of his bilateral lower extremities  Data Reviewed: Basic Metabolic Panel:  Recent Labs Lab 10/26/13 0555 10/27/13 0512 10/28/13 0730 10/29/13 0605 10/29/13 1913  NA 138 136 135 133* 132*  K 4.0 4.0 3.7 3.5 3.3*  CL 105 105 103 101  98  CO2 25 24 22 20 22   GLUCOSE 81 120* 96 90 115*  BUN 36* 21 11 7 9   CREATININE 0.96 0.59 0.60 0.54 0.60  CALCIUM 7.6* 7.8* 8.4 8.4 8.5  MG  --    --  1.9  --   --   PHOS  --   --  2.0*  --   --    Liver Function Tests:  Recent Labs Lab 10/24/13 1627 10/26/13 0555 10/29/13 0605 10/29/13 1913  AST 3810* 2607* 342* 254*  ALT 1600* 1987* 833* 760*  ALKPHOS 122* 99 109 116  BILITOT 1.0 0.6 0.8 0.7  PROT 6.4 5.4* 6.1 6.6  ALBUMIN 3.1* 2.5* 2.5* 2.7*   CBC:  Recent Labs Lab 10/24/13 1627  10/26/13 0555 10/27/13 0512 10/28/13 0730 10/29/13 0605 10/29/13 1913  WBC 13.8*  < > 9.2 6.6 7.3 7.1 6.7  NEUTROABS 11.7*  --   --   --   --   --   --   HGB 11.8*  < > 10.4* 10.4* 11.3* 12.6* 13.5  HCT 34.1*  < > 30.5* 30.7* 32.5* 35.4* 38.5*  MCV 92.4  < > 91.6 91.6 90.5 88.3 87.7  PLT 261  < > 255 236 288 322 323  < > = values in this interval not displayed. Cardiac Enzymes:  Recent Labs Lab 10/24/13 1549 10/24/13 2323 10/25/13 0349 10/25/13 0900  TROPONINI <0.30 <0.30 <0.30 <0.30   CBG:  Recent Labs Lab 10/25/13 2037 10/26/13 0734 10/26/13 0735 10/26/13 0949 10/26/13 1145  GLUCAP 96 69* 71 73 71    Recent Results (from the past 240 hour(s))  MRSA PCR SCREENING     Status: None   Collection Time    10/24/13  6:30 PM      Result Value Range Status   MRSA by PCR NEGATIVE  NEGATIVE Final   Comment:            The GeneXpert MRSA Assay (FDA     approved for NASAL specimens     only), is one component of a     comprehensive MRSA colonization     surveillance program. It is not     intended to diagnose MRSA     infection nor to guide or     monitor treatment for     MRSA infections.  AFB CULTURE WITH SMEAR     Status: None   Collection Time    10/25/13 12:21 PM      Result Value Range Status   Specimen Description PERICARDIAL FLUID   Final   Special Requests NONE   Final   ACID FAST SMEAR     Final   Value: NO ACID FAST BACILLI SEEN     Performed at [facility]   Culture     Final   Value: CULTURE WILL BE EXAMINED FOR 6 WEEKS BEFORE ISSUING A FINAL REPORT     Performed at [facility]    Report Status PENDING   Incomplete  BODY FLUID CULTURE     Status: None   Collection Time    10/25/13 12:21 PM      Result Value Range Status   Specimen Description PERICARDIAL FLUID   Final   Special Requests NONE   Final   Gram Stain     Final   Value: FEW WBC PRESENT,BOTH PMN AND MONONUCLEAR     NO ORGANISMS SEEN     Performed at [facility]  Final   Value: NO GROWTH 3 DAYS     Performed at [facility]   Report Status 10/28/2013 FINAL   Final     Scheduled Meds:  Scheduled Meds: . albuterol  2.5 mg Nebulization TID  . antiseptic oral rinse  15 mL Mouth Rinse BID  . cefUROXime (ZINACEF)  IV  1.5 g Intravenous 60 min Pre-Op  . escitalopram  10 mg Oral Daily  . feeding supplement (ENSURE COMPLETE)  237 mL Oral BID BM  . ipratropium  0.5 mg Nebulization TID  . levofloxacin  500 mg Oral Daily  . multivitamin with minerals  1 tablet Oral Daily  . QUEtiapine  50 mg Oral QHS    Time spent on care of this patient: 35 mins   Neyda Durango  [facility] Office  [phone] Pager - [phone]  10/30/2013, 12:24 PM   LOS: 6 days

## 2013-10-30 NOTE — Progress Notes (Signed)
The patient was examined and preop studies reviewed. There has been no change from the prior exam and the patient is ready for surgery.  Plan videobronch and pericardial window on R Medders

## 2013-10-30 NOTE — Transfer of Care (Signed)
Immediate Anesthesia Transfer of Care Note  Patient: Philip Andrews  Procedure(s) Performed: Procedure(s): SUBXYPHOID PERICARDIAL WINDOW (N/A) VIDEO BRONCHOSCOPY (N/A)  Patient Location: PACU  Anesthesia Type:General  Level of Consciousness: awake, alert  and oriented  Airway & Oxygen Therapy: Patient Spontanous Breathing and Patient connected to nasal cannula oxygen  Post-op Assessment: Report given to PACU RN  Post vital signs: Reviewed and stable  Complications: No apparent anesthesia complications

## 2013-10-30 NOTE — Brief Op Note (Signed)
10/24/2013 - 10/30/2013  6:03 PM  PATIENT:  Philip Andrews  57 y.o. male  PRE-OPERATIVE DIAGNOSIS:  Metastatic adenocarcinoma involving a pericardial effusion  POST-OPERATIVE DIAGNOSIS:  Metastatic adenocarcinoma involving a pericardial effusion  PROCEDURE:  Procedure(s):  SUBXYPHOID PERICARDIAL WINDOW (N/A) VIDEO BRONCHOSCOPY (N/A)  SURGEON:  Surgeon(s) and Role:    * Kerin Perna, MD - Primary  PHYSICIAN ASSISTANT: Lowella Dandy PA-C  ANESTHESIA:   general  EBL:  Total I/O In: 0  Out: 50 [Blood:50]  BLOOD ADMINISTERED:none  DRAINS: Chest tube pericardial space   LOCAL MEDICATIONS USED:  NONE  SPECIMEN:  Source of Specimen:  Pericardium  DISPOSITION OF SPECIMEN:  PATHOLOGY  COUNTS:  YES  TOURNIQUET:  * No tourniquets in log *  DICTATION: .Dragon Dictation  PLAN OF CARE: Admit to inpatient   PATIENT DISPOSITION:  PACU - hemodynamically stable.   Delay start of Pharmacological VTE agent (>24hrs) due to surgical blood loss or risk of bleeding: yes

## 2013-10-30 NOTE — Progress Notes (Signed)
IP PROGRESS NOTE  Subjective:   He is alert this morning. The headache is improved. He complains of leg weakness when ambulating.  Objective: Vital signs in last 24 hours: Blood pressure 125/78, pulse 89, temperature 98.2 F (36.8 C), temperature source Oral, resp. rate 16, height 5\' 9"  (1.753 m), weight 130 lb 12.8 oz (59.33 kg), SpO2 98.00%.  Intake/Output from previous day: 11/20 0701 - 11/21 0700 In: 422 [P.O.:422] Out: 650 [Urine:650]  Physical Exam:  Not performed today.   Lab Results:  Recent Labs  10/29/13 0605 10/29/13 1913  WBC 7.1 6.7  HGB 12.6* 13.5  HCT 35.4* 38.5*  PLT 322 323    BMET  Recent Labs  10/29/13 0605 10/29/13 1913  NA 133* 132*  K 3.5 3.3*  CL 101 98  CO2 20 22  GLUCOSE 90 115*  BUN 7 9  CREATININE 0.54 0.60  CALCIUM 8.4 8.5    Studies/Results: Dg Chest 2 View  10/29/2013   CLINICAL DATA:  Cough, history of lung cancer  EXAM: CHEST  2 VIEW  COMPARISON:  10/27/2013  FINDINGS: Mild cardiac enlargement is stable. The vascular pattern is normal. There is hazy attenuation over the lower 3rd of the left lungs. There is hazy attenuation over the right lung base. These findings do not appear to be significantly different when compared to the prior study. The lateral view confirms the presence of small right and moderate left pleural effusion with underlying consolidation likely representing passive atelectasis.  IMPRESSION: Bilateral pleural effusions.   Electronically Signed   By: Esperanza Heir M.D.   On: 10/29/2013 09:21   Ct Head W Wo Contrast  10/29/2013   CLINICAL DATA:  57 year old male with newly diagnosed lung cancer. Dizziness. Initial encounter. Staging.  EXAM: CT HEAD WITHOUT AND WITH CONTRAST  TECHNIQUE: Contiguous axial images were obtained from the base of the skull through the vertex without and with intravenous contrast  CONTRAST:  OMNIPAQUE IOHEXOL 300 MG/ML  SOLN  COMPARISON:  Non contrast head CT 06/13/2007 and  earlier.  FINDINGS: Mild paranasal sinus mucosal thickening. Mastoids and tympanic cavities are clear.  Stable visualized osseous structures.  Visualized orbits and scalp soft tissues are within normal limits.  Cerebral volume loss since 2008 with associated mild ventricular enlargement. No ventriculomegaly. No midline shift, mass effect, or evidence of intracranial mass lesion. No acute intracranial hemorrhage identified. No residual extra-axial collection identified. There a small area of cortical encephalomalacia at the right vertex noted on series 4, image 25. No abnormal enhancement identified. Grossly normal enhancement of the major intracranial vascular structures.  IMPRESSION: 1. No acute or metastatic intracranial abnormality identified.  2. 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   Electronically Signed   By: Augusto Gamble M.D.   On: 10/29/2013 11:29    Medications: I have reviewed the patient's current medications.  Assessment/Plan:  1. Metastatic adenocarcinoma involving a pericardial effusion  2. Respiratory failure/cardiac tamponade secondary to #1-resolved  3. History of polysubstance abuse  4. Acute renal failure-resolved  5. Mild normocytic anemia  6. Left hilar mass on the noncontrast chest CT 10/24/2013  7. Altered mental status-improved  The plan for a bronchoscopy and pericardial window is noted.  I will see him 11/02/2013. Please call oncology over the weekend as needed.   LOS: 6 days   Daizha Anand, Jillyn Hidden  10/30/2013, 9:42 AM

## 2013-10-30 NOTE — Clinical Social Work Placement (Signed)
     Clinical Social Work Department CLINICAL SOCIAL WORK PLACEMENT NOTE 10/30/2013  Patient:  Philip Andrews, Philip Andrews  Account Number:  [id number] Admit date:  10/24/2013  Clinical Social Worker:  Lupita Leash Teaghan Melrose, Theresia Majors  Date/time:  10/30/2013 02:19 PM  Clinical Social Work is seeking post-discharge placement for this patient at the following level of care:   SKILLED NURSING   (*CSW will update this form in Epic as items are completed)     Patient/family provided with [facility] Department of Clinical Social Works list of facilities offering this level of care within the geographic area requested by the patient (or if unable, by the patients family).    Patient/family informed of their freedom to choose among providers that offer the needed level of care, that participate in Medicare, Medicaid or managed care program needed by the patient, have an available bed and are willing to accept the patient.    Patient/family informed of [facility] ownership interest in [facility], as well as of the fact that they are under no obligation to receive care at this facility.  PASARR submitted to EDS on 10/30/2013 PASARR number received from EDS on 10/31/2013  FL2 transmitted to all facilities in geographic area requested by pt/family on   FL2 transmitted to all facilities within larger geographic area on   Patient informed that his/her managed care company has contracts with or will negotiate with  certain facilities, including the following:   No insurance  Medicaid and disability to be applied for     Patient/family informed of bed offers received:   Patient chooses bed at  Physician recommends and patient chooses bed at    Patient to be transferred to  on   Patient to be transferred to facility by   The following physician request were entered in Epic:   Additional Comments:

## 2013-10-30 NOTE — Progress Notes (Signed)
CSW met with patient today to complete a requested Health Care Power of [place]. He wants to appoint his son Philip Andrews as his HCPOA.  Document completed, witnessed and notarized. Copy placed on chart; original and 3 copies given to patient. CSW spoke with patient's son Philip Andrews via phone re: above and encouraged him to retrieve these documents as he is currently not in the hospital.  He verbalized understanding of above.  CSW discussed above information with patient's nurse- Philip Andrews. He wants to seek short term rehab when medically stable from the hospital  CSW Brief  Psychosocial Assessment will follow. [name]. [place]  [phone]

## 2013-10-30 NOTE — Clinical Social Work Psychosocial (Signed)
     Clinical Social Work Department BRIEF PSYCHOSOCIAL ASSESSMENT 10/30/2013  Patient:  Philip Andrews, Philip Andrews     Account Number:  [id number]     Admit date:  10/24/2013  Clinical Social Worker:  Tiburcio Pea  Date/Time:  10/30/2013 01:51 PM  Referred by:  RN  Date Referred:  10/30/2013 Referred for  SNF Placement  Other - See comment   Other Referral:   Wants HCPOA   Interview type:  Other - See comment Other interview type:   Patient and son Philip Andrews    PSYCHOSOCIAL DATA Living Status:  FRIEND(S) Admitted from facility:   Level of care:   Primary support name:  Philip Andrews  [phone] Primary support relationship to patient:  CHILD, ADULT Degree of support available:   Good Support    CURRENT CONCERNS Current Concerns  Post-Acute Placement   Other Concerns:    SOCIAL WORK ASSESSMENT / PLAN 57 year old male admitted from home where he lives with a long term friend.  Prior to hospitalization, patient was noted to be fully independent of all ADL's.  He has mutiple medical problems and Physical therapy is recommending SNF placement.  CSW met with patient and one of his neices today and he is in agreement to placement.  SNF list not provided today as he is getting ready for surgery and will be moved to an ICU but he agreed to initate bed search in [place] and [place] counties when appropriate.  Fl2 will be initiated.  CSW will need to folllow.  Patient assigned his son Philip Andrews as his HCPOA. Above discussed with son as well  and he is in agreement. Patient does not have any insurance;  son staes that he has an appointment at the [facility] on Monday. Son instructed need to initiate disability application at [employer] as well.   Assessment/plan status:  Psychosocial Support/Ongoing Assessment of Needs Other assessment/ plan:   Information/referral to community resources:   SNF bed list offered but deferred by patient at this time as he is getting ready today for a  procedure. Wants follow up first of next week.    PATIENTS/FAMILYS RESPONSE TO PLAN OF CARE: Patient is alert and oriented; he was living indpendently prior to this hospitalization.  He stated that he wants his son involved in his health care planning as he seems to have a difficult time understanding processes.  He made his son his HCPOA today.  Active bed search will be initiated but patient will have a pericardial window procedure today per nursing. CSW will monitor and assist with SNF placment when medically stable.  Philip Andrews, LCSWA  [phone]

## 2013-10-30 NOTE — Preoperative (Signed)
Beta Blockers   Reason not to administer Beta Blockers:Not Applicable 

## 2013-10-30 NOTE — Anesthesia Postprocedure Evaluation (Signed)
Anesthesia Post Note  Patient: Philip Andrews  Procedure(s) Performed: Procedure(s) (LRB): SUBXYPHOID PERICARDIAL WINDOW (N/A) VIDEO BRONCHOSCOPY (N/A)  Anesthesia type: General  Patient location: PACU  Post pain: Pain level controlled and Adequate analgesia  Post assessment: Post-op Vital signs reviewed, Patient's Cardiovascular Status Stable, Respiratory Function Stable, Patent Airway and Pain level controlled  Last Vitals:  Filed Vitals:   10/30/13 1819  BP: 118/77  Pulse: 100  Temp: 36.6 C  Resp:     Post vital signs: Reviewed and stable  Level of consciousness: awake, alert  and oriented  Complications: No apparent anesthesia complications

## 2013-10-30 NOTE — Anesthesia Preprocedure Evaluation (Addendum)
Anesthesia Evaluation  Patient identified by MRN, date of birth, ID band Patient awake    Reviewed: Allergy & Precautions, H&P , NPO status , Patient's Chart, lab work & pertinent test results  Airway Mallampati: I TM Distance: >3 FB Neck ROM: Full    Dental  (+) Edentulous Upper, Partial Lower, Poor Dentition and Dental Advisory Given   Pulmonary shortness of breath, COPDCurrent Smoker,  Lung ca + rhonchi   + decreased breath sounds+ wheezing      Cardiovascular Rhythm:Regular Rate:Tachycardia  Malignant effusion   Neuro/Psych    GI/Hepatic Polysubstance abuse   Endo/Other    Renal/GU ARFRenal diseaseARF resolving     Musculoskeletal   Abdominal   Peds  Hematology   Anesthesia Other Findings cachectic  Reproductive/Obstetrics                         Anesthesia Physical Anesthesia Plan  ASA: IV  Anesthesia Plan: General   Post-op Pain Management:    Induction: Intravenous  Airway Management Planned: Oral ETT  Additional Equipment: Arterial line  Intra-op Plan:   Post-operative Plan: Extubation in OR  Informed Consent: I have reviewed the patients History and Physical, chart, labs and discussed the procedure including the risks, benefits and alternatives for the proposed anesthesia with the patient or authorized representative who has indicated his/her understanding and acceptance.     Plan Discussed with: CRNA and Surgeon  Anesthesia Plan Comments:         Anesthesia Quick Evaluation

## 2013-10-30 NOTE — Anesthesia Procedure Notes (Signed)
Procedure Name: Intubation Date/Time: 10/30/2013 4:49 PM Performed by: De Nurse Pre-anesthesia Checklist: Patient identified, Emergency Drugs available, Suction available, Patient being monitored and Timeout performed Patient Re-evaluated:Patient Re-evaluated prior to inductionOxygen Delivery Method: Circle system utilized Preoxygenation: Pre-oxygenation with 100% oxygen Intubation Type: IV induction Ventilation: Mask ventilation without difficulty Laryngoscope Size: Mac and 3 Grade View: Grade I Tube type: Oral Tube size: 8.5 mm Number of attempts: 1 Airway Equipment and Method: Stylet Placement Confirmation: ETT inserted through vocal cords under direct vision,  positive ETCO2 and breath sounds checked- equal and bilateral Secured at: 22 cm Tube secured with: Tape Dental Injury: Teeth and Oropharynx as per pre-operative assessment

## 2013-10-30 NOTE — Progress Notes (Signed)
Patient prepped for OR by prior shift nurse. Per report.  Pre procedural checklist complete. VS assessed.  Patient voided.  Helped back to bed.  On call antibiotic in chart for transport with patient for his surgery.  Report called to PACU (Holding nurse) at 1350.

## 2013-10-30 NOTE — Progress Notes (Signed)
Attempted to see pt.  Pt ready to go to surgery for pericardial window.  Will defer evaluation at this time and return when appropriate. [place], [place] [phone]

## 2013-10-31 ENCOUNTER — Inpatient Hospital Stay (HOSPITAL_COMMUNITY): Payer: Medicaid Other

## 2013-10-31 LAB — BASIC METABOLIC PANEL
CO2: 21 mEq/L (ref 19–32)
Calcium: 8.1 mg/dL — ABNORMAL LOW (ref 8.4–10.5)
Chloride: 101 mEq/L (ref 96–112)
GFR calc Af Amer: >90 mL/min (ref >90)
Glucose, Bld: 129 mg/dL — ABNORMAL HIGH (ref 70–99)
Sodium: 131 mEq/L — ABNORMAL LOW (ref 135–145)

## 2013-10-31 LAB — CBC
HCT: 36.6 % — ABNORMAL LOW (ref 39.0–52.0)
MCH: 31.3 pg (ref 26.0–34.0)
MCHC: 35 g/dL (ref 30.0–36.0)
MCV: 89.5 fL (ref 78.0–100.0)
RBC: 4.09 MIL/uL — ABNORMAL LOW (ref 4.22–5.81)
RDW: 13.6 % (ref 11.5–15.5)

## 2013-10-31 LAB — BLOOD GAS, ARTERIAL
Acid-base deficit: 0.7 mmol/L (ref 0.0–2.0)
Bicarbonate: 23.2 mEq/L (ref 20.0–24.0)
O2 Saturation: 99.9 %
Patient temperature: 98.6
TCO2: 24.4 mmol/L (ref 0–100)
pH, Arterial: 7.413 (ref 7.350–7.450)

## 2013-10-31 MED ORDER — ALBUTEROL SULFATE (5 MG/ML) 0.5% IN NEBU: 2.5000 mg | INHALATION_SOLUTION | RESPIRATORY_TRACT | Status: DC | PRN

## 2013-10-31 NOTE — Progress Notes (Addendum)
      [address]       [name] [address]             [phone]      1 Day Post-Op Procedure(s) (LRB): SUBXYPHOID PERICARDIAL WINDOW (N/A) VIDEO BRONCHOSCOPY (N/A)  Subjective:  Complaints of weakness  Objective: Vital signs in last 24 hours: Temp:  [97.3 F (36.3 C)-98.5 F (36.9 C)] 98.5 F (36.9 C) (11/22 1122) Pulse Rate:  [87-107] 98 (11/22 1000) Cardiac Rhythm:  [-] Sinus tachycardia (11/22 0800) Resp:  [11-24] 15 (11/22 1000) BP: (90-121)/(49-81) 116/72 mmHg (11/22 1000) SpO2:  [99 %-100 %] 100 % (11/22 1000) Weight:  [135 lb 6.4 oz (61.417 kg)-136 lb 1.6 oz (61.735 kg)] 136 lb 1.6 oz (61.735 kg) (11/22 0439)  Intake/Output from previous day: 11/21 0701 - 11/22 0700 In: 2170 [P.O.:445; I.V.:1625; IV Piggyback:100] Out: 537 [Urine:325; Blood:50; Chest Tube:162] Intake/Output this shift: Total I/O In: 450 [P.O.:300; I.V.:150] Out: 200 [Urine:200]  General appearance: alert, cooperative and no distress Heart: regular rate and rhythm Lungs: rhonchi bilaterally Wound: clean and dry  Lab Results:  Recent Labs  10/29/13 1913 10/31/13 0430  WBC 6.7 7.9  HGB 13.5 12.8*  HCT 38.5* 36.6*  PLT 323 340   BMET:  Recent Labs  10/29/13 1913 10/31/13 0430  NA 132* 131*  K 3.3* 3.7  CL 98 101  CO2 22 21  GLUCOSE 115* 129*  BUN 9 14  CREATININE 0.60 0.62  CALCIUM 8.5 8.1*    PT/INR:  Recent Labs  10/29/13 1913  LABPROT 15.0  INR 1.21   ABG    Component Value Date/Time   PHART 7.413 10/31/2013 0439   HCO3 23.2 10/31/2013 0439   TCO2 24.4 10/31/2013 0439   ACIDBASEDEF 0.7 10/31/2013 0439   O2SAT 99.9 10/31/2013 0439   CBG (last 3)  No results found for this basename: GLUCAP,  in the last 72 hours  Assessment/Plan: S/P Procedure(s) (LRB): SUBXYPHOID PERICARDIAL WINDOW (N/A) VIDEO BRONCHOSCOPY (N/A)  1. Chest tube- 162 cc output since surgery, will leave in place for now 2. D/C Arterial line 3. Care per primary  LOS:  7 days    Lowella Dandy 10/31/2013  Will plan ct out today, discussed with PVT, not much fluid found at surgery D/c foley and a line I have seen and examined Marjo Bicker and agree with the above assessment  and plan.  Delight Ovens MD [place] [phone] Office [phone] 10/31/2013 11:50 AM

## 2013-10-31 NOTE — Progress Notes (Signed)
[facility] Progress Note  Philip Andrews ZOX:[id number] DOB: 27-Dec-1955 DOA: 10/24/2013 PCP: No primary provider on file.  Assessment/Plan: Acute respiratory failure 2nd to cardiac tamponade, required ventilatory support  -now resolved -patient despite anesthesia and procedure on 11/21 (pericardial window and bronchoscopy); has remained w/o SOB and good O2 sat on RA. -will monitor   Pericardial effusion with tamponade  -essentially resolved after emergent drainage in ER per Cardiology service. -high risk for recurrent effusion due to malignant etiology -s/p pericardial window by TCTS (11/21) -no complications -oncology on board helping dictating further treatment.  Acute renal failure  - resolved  Transaminitis ?EtOH abuse - ? Shock liver - recheck in AM - no evidence of mets of liver on CT -LFT's improving and trending down  Lt lower lung consolidation/presumed PNA Cont empiric abx to complete 7 day course  Will complete antibiotic therapy today -will continue PRN nebulizer  COPD/emphysema with bullous lung disease Well compensated at this time  -continue PRN neb  Lt perihilar mass - adenocarcinoma  Per Onc cytology is diagnostic of adenocarcinoma - awaiting immunohistochemical stains - pt will not likely be a candidate for any aggressive tx, but diagnosis needs to be confirmed. 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  Depression and delirium: -neg CT head for acute intracranial abnormalities (including mets) -will cont tx with seroquel and low dose lexapro -mentation pretty much at baseline now.  Physical deconditioning: -PT recommending SNF at discharge  Code Status: FULL Family Communication: spoke w/ son and sister at length at bedside  Disposition Plan: to be determined.  Consultants: PCCM >> [facility] Cardiology Oncology TCTS  Procedures: 11/15 Pericardiocentesis w/ pericardial drain placement >> 11/17 11/15 CT chest >> centrilobular emphysema, bullous changes, 18 mm mass LT hilum, b/l pleural effusions, ATX LLL  11/15 CT abd >> negative  11/15 ETT >>11/17 11/15 Echo >> EF 55 to 60%, mild MR, PAS 47 mmHg  11/16 Echo >> EF 65 to 70% 11/20 CT head >> no acute metastasis or intracranial abnormalities  Antibiotics: 11/15 Vanc >> 11/19 11/15 Zosyn >>11/19 11/19 levaquin  DVT prophylaxis: SCDs only   HPI/Subjective: Pt is alert, awake and oriented X 4; afebrile; good insight on condition and complaining of just feeling weak  Objective: Blood pressure 120/77, pulse 93, temperature 97.9 F (36.6 C), temperature source Oral, resp. rate 18, height 5\' 9"  (1.753 m), weight 61.735 kg (136 lb 1.6 oz), SpO2 100.00%.  Intake/Output Summary (Last 24 hours) at 10/31/13 1058 Last data filed at 10/31/13 0900  Gross per 24 hour  Intake   2620 ml  Output    737 ml  Net   1883 ml   Exam: General: No acute respiratory distress, afebrile Lungs: scattered rhonchi, otherwise CTA Cardiovascular: Regular rate and rhythm without murmur gallop or rub, normal S1 and S2 Abdomen: Nontender, nondistended, soft, bowel sounds positive, no rebound, no ascites, no appreciable mass Extremities: No significant cyanosis, clubbing, or edema of his bilateral lower extremities  Data Reviewed: Basic Metabolic Panel:  Recent Labs Lab 10/27/13 0512 10/28/13 0730 10/29/13 0605 10/29/13 1913  10/31/13 0430  NA 136 135 133* 132* 131*  K 4.0 3.7 3.5 3.3* 3.7  CL 105 103 101 98 101  CO2 24 22 20 22 21   GLUCOSE 120* 96 90 115* 129*  BUN 21 11 7 9 14   CREATININE 0.59 0.60 0.54 0.60 0.62  CALCIUM 7.8* 8.4 8.4 8.5 8.1*  MG  --  1.9  --   --   --   PHOS  --  2.0*  --   --   --    Liver Function Tests:  Recent Labs Lab 10/24/13 1627 10/26/13 0555 10/29/13 0605 10/29/13 1913  AST 3810* 2607* 342* 254*  ALT 1600* 1987* 833* 760*  ALKPHOS 122* 99 109 116  BILITOT 1.0 0.6 0.8 0.7  PROT 6.4 5.4* 6.1 6.6  ALBUMIN 3.1* 2.5* 2.5* 2.7*   CBC:  Recent Labs Lab 10/24/13 1627  10/27/13 0512 10/28/13 0730 10/29/13 0605 10/29/13 1913 10/31/13 0430  WBC 13.8*  < > 6.6 7.3 7.1 6.7 7.9  NEUTROABS 11.7*  --   --   --   --   --   --   HGB 11.8*  < > 10.4* 11.3* 12.6* 13.5 12.8*  HCT 34.1*  < > 30.7* 32.5* 35.4* 38.5* 36.6*  MCV 92.4  < > 91.6 90.5 88.3 87.7 89.5  PLT 261  < > 236 288 322 323 340  < > = values in this interval not displayed. Cardiac Enzymes:  Recent Labs Lab 10/24/13 1549 10/24/13 2323 10/25/13 0349 10/25/13 0900  TROPONINI <0.30 <0.30 <0.30 <0.30   CBG:  Recent Labs Lab 10/25/13 2037 10/26/13 0734 10/26/13 0735 10/26/13 0949 10/26/13 1145  GLUCAP 96 69* 71 73 71    Recent Results (from the past 240 hour(s))  MRSA PCR SCREENING     Status: None   Collection Time    10/24/13  6:30 PM      Result Value Range Status   MRSA by PCR NEGATIVE  NEGATIVE Final   Comment:            The GeneXpert MRSA Assay (FDA     approved for NASAL specimens     only), is one component of a     comprehensive MRSA colonization     surveillance program. It is not     intended to diagnose MRSA     infection nor to guide or     monitor treatment for     MRSA infections.  AFB CULTURE WITH SMEAR     Status: None   Collection Time    10/25/13 12:21 PM      Result Value Range Status   Specimen Description PERICARDIAL FLUID   Final   Special Requests NONE    Final   ACID FAST SMEAR     Final   Value: NO ACID FAST BACILLI SEEN     Performed at [facility]   Culture     Final   Value: CULTURE WILL BE EXAMINED FOR 6 WEEKS BEFORE ISSUING A FINAL REPORT     Performed at [facility]   Report Status PENDING   Incomplete  BODY FLUID CULTURE     Status: None   Collection Time    10/25/13 12:21 PM      Result Value Range Status   Specimen Description PERICARDIAL FLUID   Final   Special Requests NONE   Final   Gram Stain  Final   Value: FEW WBC PRESENT,BOTH PMN AND MONONUCLEAR     NO ORGANISMS SEEN     Performed at [facility]   Culture     Final   Value: NO GROWTH 3 DAYS     Performed at [facility]   Report Status 10/28/2013 FINAL   Final     Scheduled Meds:  Scheduled Meds: . acetaminophen  1,000 mg Oral Q6H   Or  . acetaminophen (TYLENOL) oral liquid 160 mg/5 mL  1,000 mg Oral Q6H  . antiseptic oral rinse  15 mL Mouth Rinse BID  . bisacodyl  10 mg Oral Daily  . escitalopram  10 mg Oral Daily  . feeding supplement (ENSURE COMPLETE)  237 mL Oral BID BM  . multivitamin with minerals  1 tablet Oral Daily  . QUEtiapine  50 mg Oral QHS    Time spent on care of this patient: 35 mins   Jaylin Benzel  [facility] Office  [phone] Pager - [phone]  10/31/2013, 10:58 AM   LOS: 7 days

## 2013-10-31 NOTE — Progress Notes (Signed)
Attempted to call report at 1702,RN unavailable and to return my call.   Tammy Sours

## 2013-10-31 NOTE — Op Note (Signed)
NAMEMarland Kitchen  Philip Andrews, Philip Andrews NO.:  [id number]  MEDICAL RECORD NO.:  [id number]  LOCATION:  2H15C                        FACILITY:  [facility]  PHYSICIAN:  Kerin Perna, M.D.  DATE OF BIRTH:  1956/10/26  DATE OF PROCEDURE:  10/30/2013 DATE OF DISCHARGE:                              OPERATIVE REPORT   OPERATION: 1. Video bronchoscopy. 2. Subxiphoid pericardial window.  SURGEON:  Kerin Perna, M.D.  ASSISTANT:  Lowella Dandy, PA-C.  PREOPERATIVE DIAGNOSES: 1. History of large malignant pericardial effusion with tamponade,     treated with pericardiocentesis. 2. Left hilar density on chest CT scan.  POSTOPERATIVE DIAGNOSES: 1. History of large malignant pericardial effusion with tamponade,     treated with pericardiocentesis. 2. Left hilar density on chest CT scan.  ANESTHESIA:  General.  OPERATIVE PROCEDURE:  The patient was brought to the operating room for video bronchoscopy and a pericardial window as requested by his oncologist to prevent recurrent malignant pericardial effusion, which was noted to be malignant by cytology showing adenocarcinoma.  Because of an abnormality on CT scan of the chest, video bronchoscopy and biopsy of the left upper lobe bronchus was requested as well.  I discussed the procedure in detail with the patient including the indications, benefits, alternatives, and risks, and he agreed to proceed under what I felt was an informed consent.  He understood the risks of postoperative pain, bleeding, arrhythmia, and infection.  DESCRIPTION OF PROCEDURE:  After the patient was anesthetized, prepped and draped, a proper time-out was performed.  Video bronchoscopy was performed first.  The bronchoscope was passed down the trachea to the carina without noting any abnormal endobronchial lesions.  The bronchoscope was passed on the right mainstem bronchus, and into the segmental orifices of the right upper lobe, right middle lobe, and  right lower lobe.  There were no endobronchial lesions noted.  Bronchoscope was then passed into the left mainstem bronchus, which was normal.  The endobronchial segments of the left upper lobe and left lower lobe were individually examined.  Washings and brushings of the left upper lobe were taken and submitted for cytology.  Bronchoscope was then withdrawn.  A second time-out was performed and a small incision was made based on the xiphoid.  The xiphoid was excised.  The distal sternum was elevated. The soft tissue anterior to the pericardium was dissected and the pericardium was identified and grasped with an Allis clamp.  A sternal elevating retractor was used to help exposure.  A 3 x 3 cm window in the anterior pericardium was created by excising the parietal pericardium. The pericardium was mildly thickened, but had no nodularity or unusual appearance.  The surface of the heart had a fibrinous exudate and was inflamed with some hemorrhagic changes.  Approximately 50 mL to 100 mL of fluid was drained.  A 28-French right angle chest tube was then placed in the pericardial space and brought out through separate incision and secured to the skin.  Hemostasis was achieved.  The elevating retractor was removed.  The thoracoabdominal fascia was closed with interrupted #1 Vicryl.  The subcutaneous layer was closed with a running Vicryl and skin was closed  with a subcuticular.  Sterile dressings were applied.  The patient was extubated and returned to the recovery room in stable condition.  The chest tube was connected to an underwater seal Pleur-Evac drainage system.  Blood loss was minimal.     Kerin Perna, M.D.     PV/MEDQ  D:  10/30/2013  T:  10/31/2013  Job:  [id number]  cc:   Ladene Artist, M.D. Lyn Records, M.D.

## 2013-10-31 NOTE — Progress Notes (Signed)
Mediastinal chest tube removed per protocol. Pt tolerated well.VSS. Bedrest for 30 minutes, continue to assess.  Tammy Sours

## 2013-11-01 LAB — COMPREHENSIVE METABOLIC PANEL
ALT: 332 U/L — ABNORMAL HIGH (ref 0–53)
AST: 69 U/L — ABNORMAL HIGH (ref 0–37)
Albumin: 2.7 g/dL — ABNORMAL LOW (ref 3.5–5.2)
CO2: 26 mEq/L (ref 19–32)
Calcium: 8.5 mg/dL (ref 8.4–10.5)
Creatinine, Ser: 0.74 mg/dL (ref 0.50–1.35)
GFR calc Af Amer: >90 mL/min (ref >90)
GFR calc non Af Amer: >90 mL/min (ref >90)
Glucose, Bld: 80 mg/dL (ref 70–99)
Total Protein: 6.5 g/dL (ref 6.0–8.3)

## 2013-11-01 LAB — CBC
MCH: 31.2 pg (ref 26.0–34.0)
MCHC: 34.6 g/dL (ref 30.0–36.0)
MCV: 90.2 fL (ref 78.0–100.0)
Platelets: 371 10*3/uL (ref 150–400)
RBC: 4.17 MIL/uL — ABNORMAL LOW (ref 4.22–5.81)

## 2013-11-01 NOTE — Progress Notes (Signed)
Pt is now refusing to wear SCD's.  He is up moving around freely in room.

## 2013-11-01 NOTE — Progress Notes (Addendum)
      [address]       Philip Andrews [address]             [phone]      2 Days Post-Op Procedure(s) (LRB): SUBXYPHOID PERICARDIAL WINDOW (N/A) VIDEO BRONCHOSCOPY (N/A)  Subjective:  Philip Andrews complains of some incisional soreness with cough.   Objective: Vital signs in last 24 hours: Temp:  [98.2 F (36.8 C)-98.5 F (36.9 C)] 98.5 F (36.9 C) (11/23 0453) Pulse Rate:  [88-100] 88 (11/23 0453) Cardiac Rhythm:  [-] Sinus tachycardia;Heart block (11/22 2151) Resp:  [15-26] 18 (11/23 0453) BP: (107-136)/(61-92) 107/61 mmHg (11/23 0453) SpO2:  [94 %-100 %] 94 % (11/23 0453) Weight:  [133 lb (60.328 kg)] 133 lb (60.328 kg) (11/23 0453)  Intake/Output from previous day: 11/22 0701 - 11/23 0700 In: 1050 [P.O.:900; I.V.:150] Out: 500 [Urine:500]  General appearance: alert, cooperative and no distress Heart: regular rate and rhythm Lungs: clear to auscultation bilaterally Wound: clean and dry  Lab Results:  Recent Labs  10/31/13 0430 11/01/13 0513  WBC 7.9 8.1  HGB 12.8* 13.0  HCT 36.6* 37.6*  PLT 340 371   BMET:  Recent Labs  10/31/13 0430 11/01/13 0513  NA 131* 134*  K 3.7 4.1  CL 101 101  CO2 21 26  GLUCOSE 129* 80  BUN 14 14  CREATININE 0.62 0.74  CALCIUM 8.1* 8.5    PT/INR:  Recent Labs  10/29/13 1913  LABPROT 15.0  INR 1.21   ABG    Component Value Date/Time   PHART 7.413 10/31/2013 0439   HCO3 23.2 10/31/2013 0439   TCO2 24.4 10/31/2013 0439   ACIDBASEDEF 0.7 10/31/2013 0439   O2SAT 99.9 10/31/2013 0439   CBG (last 3)  No results found for this basename: GLUCAP,  in the last 72 hours  Assessment/Plan: S/P Procedure(s) (LRB): SUBXYPHOID PERICARDIAL WINDOW (N/A) VIDEO BRONCHOSCOPY (N/A)  1. Chest tube removed, incision clean 2. Will schedule 2 week follow up with Dr. Donata Clay, from hospital discharge 3. Care per primary   LOS: 8 days    BARRETT, ERIN 11/01/2013  I have seen and examined Philip Andrews and  agree with the above assessment  and plan.  Delight Ovens MD Beeper [phone] Office [phone] 11/01/2013 1:04 PM

## 2013-11-01 NOTE — Progress Notes (Signed)
[facility] Progress Note  Philip Andrews:[id number] DOB: 12-23-1955 DOA: 10/24/2013 PCP: No primary provider on file.  Assessment/Plan: Acute respiratory failure 2nd to cardiac tamponade, required ventilatory support  -now resolved -patient despite anesthesia and procedure on 11/21 (pericardial window and bronchoscopy); has remained w/o SOB and good O2 sat on RA. -will continue monitoring   Pericardial effusion with tamponade  -essentially resolved after emergent drainage in ER per Cardiology service. -high risk for recurrent effusion due to malignant etiology -s/p pericardial window by TCTS (11/21) -no complications -oncology on board helping dictating further treatment, will follow rec's.  Acute renal failure  - resolved  Transaminitis ?ETOH abuse - ? Shock liver - recheck in AM - no evidence of mets of liver on CT -LFT's improving and trending down (AST 69 and ALT 332 now; 11/23)  Left lower lung consolidation/presumed PNA -patient has completed abx therapy -will continue PRN nebulizer -complaining of some chest discomfort around wound with cough; otherwise no complaints  COPD/emphysema with bullous lung disease Well compensated at this time  -continue PRN neb  Lt perihilar mass - adenocarcinoma  Per Onc cytology is diagnostic of adenocarcinoma - awaiting immunohistochemical stains - pt will not likely be a candidate for any aggressive tx, but diagnosis needs to be confirmed. -TCTS has been consulted for perical window and bronchoscopy which has been done on 11/21; no complications -will follow Onc rec's and follow results  Tobacco abuse -cessation counseling has been provided -patient declines nicotine patch  Anemia of chronic disease and critical illness Hgb stable  Polysubstance abuse - UDS positive for cocaine -counseling for cessation provided  History of free air perforated ulcer 2004, requiring exploratory lap with patch closure  H/o traumatic  brain injury with small subdural hematoma/hygroma and small left frontal intracerebral petechial contusions 07/2007 Stable.  Depression and delirium: -neg CT head for acute intracranial abnormalities (including mets) -will cont tx with seroquel and low dose lexapro -mentation pretty much at baseline now.  Physical deconditioning: -PT recommending SNF at discharge  Code Status: FULL Family Communication: spoke w/ son and sister at length at bedside  Disposition Plan: to be determined.  Consultants: PCCM >> [facility] Cardiology Oncology TCTS  Procedures: 11/15 Pericardiocentesis w/ pericardial drain placement >> 11/17 11/15 CT chest >> centrilobular emphysema, bullous changes, 18 mm mass LT hilum, b/l pleural effusions, ATX LLL  11/15 CT abd >> negative  11/15 ETT >>11/17 11/15 Echo >> EF 55 to 60%, mild MR, PAS 47 mmHg  11/16 Echo >> EF 65 to 70% 11/20 CT head >> no acute metastasis or intracranial abnormalities  Antibiotics: 11/15 Vanc >> 11/19 11/15 Zosyn >>11/19 11/19 levaquin  DVT prophylaxis: SCDs only   HPI/Subjective: Pt is alert, awake and oriented X 4; afebrile; good insight on condition and complaining weakness and soreness around incision with cough.  Objective: Blood pressure 111/56, pulse 97, temperature 98.4 F (36.9 C), temperature source Oral, resp. rate 19, height 5\' 9"  (1.753 m), weight 60.328 kg (133 lb), SpO2 98.00%.  Intake/Output Summary (Last 24 hours) at 11/01/13 1645 Last data filed at 11/01/13 1300  Gross per 24 hour  Intake    480 ml  Output      0 ml  Net    480 ml   Exam: General: No acute respiratory distress, afebrile Lungs: scattered rhonchi, otherwise CTA Cardiovascular: Regular rate and rhythm without murmur gallop or rub, normal S1 and S2 Abdomen: Nontender, nondistended, soft, bowel sounds positive, no rebound, no ascites, no appreciable mass  Extremities: No significant cyanosis, clubbing, or edema of his bilateral lower  extremities  Data Reviewed: Basic Metabolic Panel:  Recent Labs Lab 10/27/13 0512 10/28/13 0730 10/29/13 0605 10/29/13 1913 10/31/13 0430 11/01/13 0513  NA 136 135 133* 132* 131* 134*  K 4.0 3.7 3.5 3.3* 3.7 4.1  CL 105 103 101 98 101 101  CO2 24 22 20 22 21 26   GLUCOSE 120* 96 90 115* 129* 80  BUN 21 11 7 9 14 14   CREATININE 0.59 0.60 0.54 0.60 0.62 0.74  CALCIUM 7.8* 8.4 8.4 8.5 8.1* 8.5  MG  --  1.9  --   --   --   --   PHOS  --  2.0*  --   --   --   --    Liver Function Tests:  Recent Labs Lab 10/26/13 0555 10/29/13 0605 10/29/13 1913 11/01/13 0513  AST 2607* 342* 254* 69*  ALT 1987* 833* 760* 332*  ALKPHOS 99 109 116 89  BILITOT 0.6 0.8 0.7 0.7  PROT 5.4* 6.1 6.6 6.5  ALBUMIN 2.5* 2.5* 2.7* 2.7*   CBC:  Recent Labs Lab 10/28/13 0730 10/29/13 0605 10/29/13 1913 10/31/13 0430 11/01/13 0513  WBC 7.3 7.1 6.7 7.9 8.1  HGB 11.3* 12.6* 13.5 12.8* 13.0  HCT 32.5* 35.4* 38.5* 36.6* 37.6*  MCV 90.5 88.3 87.7 89.5 90.2  PLT 288 322 323 340 371   Cardiac Enzymes: No results found for this basename: CKTOTAL, CKMB, CKMBINDEX, TROPONINI,  in the last 168 hours CBG:  Recent Labs Lab 10/25/13 2037 10/26/13 0734 10/26/13 0735 10/26/13 0949 10/26/13 1145  GLUCAP 96 69* 71 73 71    Recent Results (from the past 240 hour(s))  MRSA PCR SCREENING     Status: None   Collection Time    10/24/13  6:30 PM      Result Value Range Status   MRSA by PCR NEGATIVE  NEGATIVE Final   Comment:            The GeneXpert MRSA Assay (FDA     approved for NASAL specimens     only), is one component of a     comprehensive MRSA colonization     surveillance program. It is not     intended to diagnose MRSA     infection nor to guide or     monitor treatment for     MRSA infections.  AFB CULTURE WITH SMEAR     Status: None   Collection Time    10/25/13 12:21 PM      Result Value Range Status   Specimen Description PERICARDIAL FLUID   Final   Special Requests NONE    Final   ACID FAST SMEAR     Final   Value: NO ACID FAST BACILLI SEEN     Performed at [facility]   Culture     Final   Value: CULTURE WILL BE EXAMINED FOR 6 WEEKS BEFORE ISSUING A FINAL REPORT     Performed at [facility]   Report Status PENDING   Incomplete  BODY FLUID CULTURE     Status: None   Collection Time    10/25/13 12:21 PM      Result Value Range Status   Specimen Description PERICARDIAL FLUID   Final   Special Requests NONE   Final   Gram Stain     Final   Value: FEW WBC PRESENT,BOTH PMN AND MONONUCLEAR     NO ORGANISMS SEEN  Performed at [facility]     Final   Value: NO GROWTH 3 DAYS     Performed at [facility]   Report Status 10/28/2013 FINAL   Final     Scheduled Meds:  Scheduled Meds: . antiseptic oral rinse  15 mL Mouth Rinse BID  . bisacodyl  10 mg Oral Daily  . escitalopram  10 mg Oral Daily  . feeding supplement (ENSURE COMPLETE)  237 mL Oral BID BM  . multivitamin with minerals  1 tablet Oral Daily  . QUEtiapine  50 mg Oral QHS    Time spent on care of this patient: 35 mins   Philip Andrews  [facility] Office  [phone] Pager - [phone]  11/01/2013, 4:45 PM   LOS: 8 days

## 2013-11-01 NOTE — Progress Notes (Signed)
Pt refused to walk at this time. Will continue to monitor.  

## 2013-11-02 MED ORDER — IPRATROPIUM-ALBUTEROL 20-100 MCG/ACT IN AERS
1.0000 | INHALATION_SPRAY | Freq: Four times a day (QID) | RESPIRATORY_TRACT | Status: DC | PRN
  Filled 2013-11-02: qty 4

## 2013-11-02 NOTE — Progress Notes (Addendum)
CSW went into patient room to speak to patient and patient son by bedside. CSW explained the SNF process to patient's son and explained that because the patient does not have insurance, it would be difficult to find placement nearby, and may have to be outside of [facility]. Patient's son understood. CSW explained that the hospital would have to do a Letter of Guarantee for a two week stay at a nursing home. Patient stated he was frustrated with the "what if's" and wants to just go home now. CSW explained that the MD will see patient today when the doctor does rounds and encouraged the patient to talk to his doctor about an approximate dc date. CSW is standing by for dc plan.  CSW received handoff from social worker that patient transferred to 2W. This CSW will continue to follow for dc to snf when medically ready. CSW is continuing to search for a bed; there are no offers at this time. Will update when new information arises.  Maree Krabbe, MSW, Theresia Majors [phone]

## 2013-11-02 NOTE — Progress Notes (Addendum)
      [address]       [name] [address]             [phone]        3 Days Post-Op Procedure(s) (LRB): SUBXYPHOID PERICARDIAL WINDOW (N/A) VIDEO BRONCHOSCOPY (N/A)  Subjective: Patient without complaints.  Objective: Vital signs in last 24 hours: Temp:  [98.4 F (36.9 C)] 98.4 F (36.9 C) (11/24 0449) Pulse Rate:  [97-98] 98 (11/23 2123) Cardiac Rhythm:  [-] Normal sinus rhythm (11/23 1942) Resp:  [18-19] 18 (11/24 0449) BP: (99-119)/(55-57) 99/57 mmHg (11/24 0449) SpO2:  [96 %-98 %] 96 % (11/24 0449) Weight:  [60.4 kg (133 lb 2.5 oz)] 60.4 kg (133 lb 2.5 oz) (11/24 0449)  Current Weight  11/02/13 60.4 kg (133 lb 2.5 oz)      Intake/Output from previous day: 11/23 0701 - 11/24 0700 In: 240 [P.O.:240] Out: -    Physical Exam:  Cardiovascular: RRR, no murmurs, gallops, or rubs. Pulmonary: Clear to auscultation bilaterally; no rales, wheezes, or rhonchi. Abdomen: Soft, non tender, bowel sounds present. Wounds: Clean and dry.  No erythema or signs of infection.  Lab Results: CBC: Recent Labs  10/31/13 0430 11/01/13 0513  WBC 7.9 8.1  HGB 12.8* 13.0  HCT 36.6* 37.6*  PLT 340 371   BMET:  Recent Labs  10/31/13 0430 11/01/13 0513  NA 131* 134*  K 3.7 4.1  CL 101 101  CO2 21 26  GLUCOSE 129* 80  BUN 14 14  CREATININE 0.62 0.74  CALCIUM 8.1* 8.5    PT/INR:  Lab Results  Component Value Date   INR 1.21 10/29/2013   INR 2.50* 10/24/2013   INR 7.86* 10/24/2013   ABG:  INR: Will add last result for INR, ABG once components are confirmed Will add last 4 CBG results once components are confirmed  Assessment/Plan:  1. CV - SR. Pericardial chest tube removed yesterday. Culture of pericardial fluid showed no growth or organisms. Wound is clean and dry. Chest tube suture will be removed in office. 2. Management per primary  ZIMMERMAN,DONIELLE MPA-C 11/02/2013,10:19 AM  Pathology on pericardium and bronchoscopy biopsies  still pending.  patient examined and medical record reviewed,agree with above note. VAN TRIGT III,PETER 11/02/2013     .

## 2013-11-02 NOTE — Progress Notes (Signed)
[facility] Progress Note  Philip Andrews ZOX:[id number] DOB: 29-Dec-1955 DOA: 10/24/2013 PCP: No primary provider on file.  Assessment/Plan: Acute respiratory failure 2nd to cardiac tamponade, required ventilatory support  -now resolved -patient despite anesthesia and procedure on 11/21 (pericardial window and bronchoscopy); has remained w/o SOB and good O2 sat on RA. -will continue monitoring and using PRN combivent  Pericardial effusion with tamponade  -essentially resolved after emergent drainage in ER per Cardiology service. -high risk for recurrent effusion due to malignant etiology -s/p pericardial window by TCTS (11/21) -no complications -oncology on board helping dictating further treatment, will follow rec's. -plan is for outpatient PET-Scan and to follow with oncology at his office  Acute renal failure  -resolved  Transaminitis ?ETOH abuse - ? Shock liver - recheck in AM - no evidence of mets of liver on CT -LFT's improving and trending down (AST 69 and ALT 332 now; 11/23) -will follow LFT's during follow up visit  Left lower lung consolidation/presumed PNA -patient has completed abx therapy -will continue PRN combivent -complaining of some chest discomfort around wound with cough; otherwise no complaints  COPD/emphysema with bullous lung disease Well compensated at this time  -continue PRN combivent  Lt perihilar mass - adenocarcinoma  Per Onc cytology is diagnostic of adenocarcinoma - awaiting immunohistochemical stains - pt will not likely be a candidate for any aggressive tx, but diagnosis needs to be confirmed. 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  Depression and delirium: -neg CT head for acute intracranial abnormalities (including mets) -will cont tx with seroquel and low dose lexapro -mentation pretty much at baseline now.  Physical deconditioning: -PT reevaluate patient and since last assessment significant improvement and currently not requiring SNF -will discharge home in am  Code Status: FULL Family Communication: spoke w/ son at length at bedside  Disposition Plan: discharge home in am  Consultants: PCCM >> [facility] Cardiology Oncology [facility]  Procedures: 11/15 Pericardiocentesis w/ pericardial drain placement >> 11/17 11/15 CT chest >> centrilobular emphysema, bullous changes, 18 mm mass LT hilum, b/l pleural effusions, ATX LLL  11/15 CT abd >> negative  11/15 ETT >>11/17 11/15 Echo >> EF 55 to 60%, mild MR, PAS 47 mmHg  11/16 Echo >> EF 65 to 70% 11/20 CT head >> no acute metastasis or intracranial abnormalities  Antibiotics: 11/15 Vanc >> 11/19 11/15 Zosyn >>11/19 11/19 levaquin  DVT prophylaxis: SCDs only   HPI/Subjective: Pt is alert, awake and oriented X 4; afebrile; good insight on condition. Strength improved and now no requiring SNF.   Objective: Blood pressure 99/57, pulse 98, temperature 98.4 F (36.9 C), temperature source Oral, resp. rate 18, height 5\' 9"  (1.753 m), weight 60.4 kg (133 lb 2.5 oz), SpO2 96.00%.  Intake/Output Summary (Last 24 hours) at 11/02/13 1506 Last data filed at 11/02/13 0827  Gross per 24 hour  Intake    360 ml  Output  0 ml  Net    360 ml   Exam: General: No acute respiratory distress, afebrile Lungs:  scattered rhonchi, otherwise CTA Cardiovascular: Regular rate and rhythm without murmur gallop or rub, normal S1 and S2 Abdomen: Nontender, nondistended, soft, bowel sounds positive, no rebound, no ascites, no appreciable mass Extremities: No significant cyanosis, clubbing, or edema of his bilateral lower extremities  Data Reviewed: Basic Metabolic Panel:  Recent Labs Lab 10/27/13 0512 10/28/13 0730 10/29/13 0605 10/29/13 1913 10/31/13 0430 11/01/13 0513  NA 136 135 133* 132* 131* 134*  K 4.0 3.7 3.5 3.3* 3.7 4.1  CL 105 103 101 98 101 101  CO2 24 22 20 22 21 26   GLUCOSE 120* 96 90 115* 129* 80  BUN 21 11 7 9 14 14   CREATININE 0.59 0.60 0.54 0.60 0.62 0.74  CALCIUM 7.8* 8.4 8.4 8.5 8.1* 8.5  MG  --  1.9  --   --   --   --   PHOS  --  2.0*  --   --   --   --    Liver Function Tests:  Recent Labs Lab 10/29/13 0605 10/29/13 1913 11/01/13 0513  AST 342* 254* 69*  ALT 833* 760* 332*  ALKPHOS 109 116 89  BILITOT 0.8 0.7 0.7  PROT 6.1 6.6 6.5  ALBUMIN 2.5* 2.7* 2.7*   CBC:  Recent Labs Lab 10/28/13 0730 10/29/13 0605 10/29/13 1913 10/31/13 0430 11/01/13 0513  WBC 7.3 7.1 6.7 7.9 8.1  HGB 11.3* 12.6* 13.5 12.8* 13.0  HCT 32.5* 35.4* 38.5* 36.6* 37.6*  MCV 90.5 88.3 87.7 89.5 90.2  PLT 288 322 323 340 371    Recent Results (from the past 240 hour(s))  MRSA PCR SCREENING     Status: None   Collection Time    10/24/13  6:30 PM      Result Value Range Status   MRSA by PCR NEGATIVE  NEGATIVE Final   Comment:            The GeneXpert MRSA Assay (FDA     approved for NASAL specimens     only), is one component of a     comprehensive MRSA colonization     surveillance program. It is not     intended to diagnose MRSA     infection nor to guide or     monitor treatment for     MRSA infections.  AFB CULTURE WITH SMEAR     Status: None   Collection Time    10/25/13 12:21 PM      Result Value Range Status   Specimen Description PERICARDIAL FLUID   Final    Special Requests NONE   Final   ACID FAST SMEAR     Final   Value: NO ACID FAST BACILLI SEEN     Performed at [facility]   Culture     Final   Value: CULTURE WILL BE EXAMINED FOR 6 WEEKS BEFORE ISSUING A FINAL REPORT     Performed at [facility]   Report Status PENDING   Incomplete  BODY FLUID CULTURE     Status: None   Collection Time    10/25/13 12:21 PM      Result Value Range Status   Specimen Description PERICARDIAL FLUID   Final   Special Requests NONE   Final   Gram Stain     Final   Value: FEW WBC PRESENT,BOTH PMN AND MONONUCLEAR     NO ORGANISMS  SEEN     Performed at [facility]     Final   Value: NO GROWTH 3 DAYS     Performed at [facility]   Report Status 10/28/2013 FINAL   Final     Scheduled Meds:  Scheduled Meds: . antiseptic oral rinse  15 mL Mouth Rinse BID  . bisacodyl  10 mg Oral Daily  . escitalopram  10 mg Oral Daily  . feeding supplement (ENSURE COMPLETE)  237 mL Oral BID BM  . multivitamin with minerals  1 tablet Oral Daily  . QUEtiapine  50 mg Oral QHS    Time spent on care of this patient: 35 mins   Jeydi Klingel  [facility] Office  [phone] Pager - [phone]  11/02/2013, 3:06 PM   LOS: 9 days

## 2013-11-02 NOTE — Progress Notes (Signed)
IP PROGRESS NOTE  Subjective:   He appears well. No specific complaint.  Objective: Vital signs in last 24 hours: Blood pressure 99/57, pulse 98, temperature 98.4 F (36.9 C), temperature source Oral, resp. rate 18, height 5\' 9"  (1.753 m), weight 133 lb 2.5 oz (60.4 kg), SpO2 96.00%.  Intake/Output from previous day: 11/23 0701 - 11/24 0700 In: 240 [P.O.:240] Out: -   Physical Exam:  Not performed today.   Lab Results:  Recent Labs  10/31/13 0430 11/01/13 0513  WBC 7.9 8.1  HGB 12.8* 13.0  HCT 36.6* 37.6*  PLT 340 371    BMET  Recent Labs  10/31/13 0430 11/01/13 0513  NA 131* 134*  K 3.7 4.1  CL 101 101  CO2 21 26  GLUCOSE 129* 80  BUN 14 14  CREATININE 0.62 0.74  CALCIUM 8.1* 8.5    Studies/Results: No results found.  Medications: I have reviewed the patient's current medications.  Assessment/Plan:  1. Metastatic adenocarcinoma involving a pericardial effusion  Status post a paracardial window procedure and bronchoscopy on 10/31/2013  2. Respiratory failure/cardiac tamponade secondary to #1-resolved  3. History of polysubstance abuse  4. Acute renal failure-resolved  5. Mild normocytic anemia  6. Left hilar mass on the noncontrast chest CT 10/24/2013  7. Altered mental status-improved  The operative findings were reviewed. We will wait on the final pathology from the bronchoscopy and pericardial window procedure prior to making treatment recommendations.  Recommendations: 1. Perform additional immunohistochemical stains on the pericardial specimen 2. Outpatient PET scan 3. Outpatient followup at the [facility] within the next 2 weeks.   LOS: 9 days   Philip Andrews  11/02/2013, 3:27 PM

## 2013-11-02 NOTE — Progress Notes (Signed)
OT Cancellation Note  Patient Details Name: Philip Andrews MRN: [id number] DOB: 11/09/1956   Cancelled Treatment:     Orders received, chart reviewed & pt screened for acute OT. Upon entering room, pt sitting up at EOB on phone & w/ visitor in room. Pt was educated in OT orders and demonstrated Independent ambulation in room, transferred from bed-chair, independently donned/doffed socks. Pt states he has been independently ambulating in room/bathroom and reports no acute OT needs at this time. Will sign off, no needs.  Roselie Awkward Dixon 11/02/2013, 12:35 PM

## 2013-11-02 NOTE — Progress Notes (Signed)
CSW spoke to MD about dc plans. Per MD, patient is going to go home for discharge. CSW signing off at this time. Please re consult if social work needs arise.  Maree Krabbe, MSW, [facility]ors [phone]

## 2013-11-03 ENCOUNTER — Telehealth: Payer: Self-pay | Admitting: Oncology

## 2013-11-03 ENCOUNTER — Other Ambulatory Visit (HOSPITAL_COMMUNITY): Payer: Self-pay | Admitting: Internal Medicine

## 2013-11-03 ENCOUNTER — Encounter (HOSPITAL_COMMUNITY): Payer: Self-pay | Admitting: Cardiothoracic Surgery

## 2013-11-03 DIAGNOSIS — R911 Solitary pulmonary nodule: Secondary | ICD-10-CM

## 2013-11-03 MED ORDER — ESCITALOPRAM OXALATE 10 MG PO TABS: 10.0000 mg | ORAL_TABLET | Freq: Every day | ORAL | Status: DC

## 2013-11-03 MED ORDER — OXYCODONE-ACETAMINOPHEN 5-325 MG PO TABS: 1.0000 | ORAL_TABLET | Freq: Four times a day (QID) | ORAL | Status: DC | PRN

## 2013-11-03 MED ORDER — IPRATROPIUM-ALBUTEROL 20-100 MCG/ACT IN AERS: 1.0000 | INHALATION_SPRAY | Freq: Four times a day (QID) | RESPIRATORY_TRACT | Status: DC | PRN

## 2013-11-03 MED ORDER — ENSURE COMPLETE PO LIQD: 237.0000 mL | Freq: Three times a day (TID) | ORAL | Status: DC

## 2013-11-03 NOTE — Telephone Encounter (Signed)
C/D 11/03/13 for appt. 11/13/13

## 2013-11-03 NOTE — Progress Notes (Signed)
       [address]       [name] [address]             [phone]          4 Days Post-Op Procedure(s) (LRB): SUBXYPHOID PERICARDIAL WINDOW (N/A) VIDEO BRONCHOSCOPY (N/A)  Subjective: Feels well, no complaints.   Objective: Vital signs in last 24 hours: Patient Vitals for the past 24 hrs:  BP Temp Temp src Pulse Resp SpO2 Weight  11/03/13 0637 120/68 mmHg 98.2 F (36.8 C) Oral 89 18 97 % -  11/03/13 0636 - - - - - - 126 lb 12.2 oz (57.5 kg)  11/02/13 2137 123/70 mmHg 98.3 F (36.8 C) Oral - 18 97 % -   Current Weight  11/03/13 126 lb 12.2 oz (57.5 kg)     Intake/Output from previous day: 11/24 0701 - 11/25 0700 In: 240 [P.O.:240] Out: -     PHYSICAL EXAM:  Heart: RRR Lungs: Clear Wound: Clean and dry    Lab Results: CBC: Recent Labs  11/01/13 0513  WBC 8.1  HGB 13.0  HCT 37.6*  PLT 371   BMET:  Recent Labs  11/01/13 0513  NA 134*  K 4.1  CL 101  CO2 26  GLUCOSE 80  BUN 14  CREATININE 0.74  CALCIUM 8.5    PT/INR: No results found for this basename: LABPROT, INR,  in the last 72 hours    Assessment/Plan: S/P Procedure(s) (LRB): SUBXYPHOID PERICARDIAL WINDOW (N/A) VIDEO BRONCHOSCOPY (N/A) Stable from surgical standpoint.  Ok to d/c when ok with primary service. Final path remains pending. Will arrange outpatient followup and suture removal.    LOS: 10 days    Philip Andrews H 11/03/2013

## 2013-11-03 NOTE — Progress Notes (Signed)
IV and tele monitor d/c at this time; pt to d/c home with son; awaiting son's arrival; pt given d/c instructions; pt verbalized understanding; will cont. To monitor.

## 2013-11-03 NOTE — Telephone Encounter (Signed)
Pt schedule to see Dr. Truett Perna 12/05 @ 2 per md. Welcome packet mailed.

## 2013-11-03 NOTE — Discharge Summary (Signed)
Physician Discharge Summary  Philip Andrews WUJ:[id number] DOB: 25-Feb-1956 DOA: 10/24/2013  PCP: No primary provider on file.  Admit date: 10/24/2013 Discharge date: 11/03/2013  Time spent: >30 minutes  Recommendations for Outpatient Follow-up:  1. CMET to follow electrolytes, renal function and LFT's 2. Reassess patient breathing status; will benefit of PFT's in outpatient setting and long term COPD maintenance management. 3. Follow pathology results (oncology service will be vigilant of results to provide further recommendations).  Discharge Diagnoses:  Principal Problem:   Acute respiratory failure with hypoxia Active Problems:   Acute renal failure   Pericardial effusion   COPD with emphysema   Hilar mass   Discharge Condition: stable and improved. Discharge home with appointments to establish PCP at community clinic and to follow with Dr. Donata Clay and Dr. Truett Perna.  Diet recommendation: regular diet.  Filed Weights   11/01/13 0453 11/02/13 0449 11/03/13 0636  Weight: 60.328 kg (133 lb) 60.4 kg (133 lb 2.5 oz) 57.5 kg (126 lb 12.2 oz)    History of present illness:  57 yo male with known hx of polysubstance abuse admitted to ER with chest pain and hypotension found to have large pericardial effusion with cardiac tamponade requiring emergent pericardiocentesis w/ drain in ER .   Hospital Course:  Acute respiratory failure 2nd to cardiac tamponade, required ventilatory support  -now resolved  -patient despite anesthesia and procedure on 11/21 (pericardial window and bronchoscopy); has remained w/o SOB and good O2 sat on RA.  -will continue monitoring and using PRN combivent   Pericardial effusion with tamponade  -essentially resolved after emergent drainage in ER per Cardiology service.  -high risk for recurrent effusion due to malignant etiology  -s/p pericardial window by TCTS (11/21)  -no complications  -oncology on board helping dictating further treatment, will  follow rec's.  -plan is for outpatient PET-Scan and to follow with oncology at his office   Acute renal failure  -resolved   Transaminitis  ?ETOH abuse - ? Shock liver - recheck in AM - no evidence of mets of liver on CT  -LFT's improving and trending down (AST 69 and ALT 332 now; 11/23)  -will follow LFT's during follow up visit   Left lower lung consolidation/presumed PNA  -patient has completed abx therapy  -will continue PRN combivent  -complaining of some chest discomfort around wound with cough; otherwise no complaints   COPD/emphysema with bullous lung disease  Well compensated at this time  -continue PRN combivent   Lt perihilar mass - adenocarcinoma  Per Onc cytology is diagnostic of adenocarcinoma - awaiting immunohistochemical stains - pt will not likely be a candidate for any aggressive tx, but diagnosis needs to be confirmed.  -TCTS has been consulted for perical window and bronchoscopy which has been done on 11/21; no complications  -will follow Onc rec's and follow results (PT-Scan as an outpatient and follow up with oncology as an outpatient)   Tobacco abuse  -cessation counseling has been provided  -patient declines nicotine patch  -is planning to stop smoking   Anemia of chronic disease and critical illness  Hgb stable   Polysubstance abuse - UDS positive for cocaine  -counseling for cessation provided  -patient reports he will quit cocaine, alcohol and tobacco abuse  History of free air perforated ulcer 2004, requiring exploratory lap with patch closure  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   Depression  -neg CT head for acute  intracranial abnormalities (including mets)  -will cont lexapro  Physical deconditioning:  -PT reevaluate patient and since last assessment significant improvement and currently not requiring SNF  -will discharge home and arrange  follow up with community clinic to establish PCP relationship   Procedures: 11/15 Pericardiocentesis w/ pericardial drain placement >> 11/17  11/15 CT chest >> centrilobular emphysema, bullous changes, 18 mm mass LT hilum, b/l pleural effusions, ATX LLL  11/15 CT abd >> negative  11/15 ETT >>11/17  11/15 Echo >> EF 55 to 60%, mild MR, PAS 47 mmHg  11/16 Echo >> EF 65 to 70%  11/20 CT head >> no acute metastasis or intracranial abnormalities 11/21 pericardial window and video-bronchoscopy  Consultations: PCCM  Cardiology  Oncology  TCTS   Discharge Exam: Ceasar Mons Vitals:   11/03/13 0637  BP: 120/68  Pulse: 89  Temp: 98.2 F (36.8 C)  Resp: 18   General: No acute respiratory distress, afebrile  Lungs: scattered rhonchi, otherwise CTA  Cardiovascular: Regular rate and rhythm without murmur gallop or rub, normal S1 and S2  Abdomen: Nontender, nondistended, soft, bowel sounds positive, no rebound, no ascites, no appreciable mass  Extremities: No significant cyanosis, clubbing, or edema of his bilateral lower extremities  Discharge Instructions  Discharge Orders   Future Appointments Provider Department Dept Phone   11/13/2013 1:30 PM [facility] [phone]   11/13/2013 2:00 PM Ladene Artist, MD [facility] MEDICAL ONCOL[facility]Y [phone]   11/16/2013 2:30 PM [facility] [place] [phone]   11/23/2013 4:00 PM Doris Cheadle, MD [facility] [phone]   Future Orders Complete By Expires   Discharge instructions  As directed    Comments:     Keep yourself well hydrated and take medications as prescribed Please follow with different providers as instructed (Dr. Donata Clay, Dr. Truett Perna and with physician at the community clinic to establish primary care) Follow appointment for Pet Scan; you will be contacted with appointment  details for that study No smoking and no recreational substances Continue using ensure for better nutrition.       Medication List         escitalopram 10 MG tablet  Commonly known as:  LEXAPRO  Take 1 tablet (10 mg total) by mouth daily.     feeding supplement (ENSURE COMPLETE) Liqd  Take 237 mLs by mouth 3 (three) times daily between meals.     Ipratropium-Albuterol 20-100 MCG/ACT Aers respimat  Commonly known as:  COMBIVENT  Inhale 1 puff into the lungs every 6 (six) hours as needed for wheezing or shortness of breath.     oxyCODONE-acetaminophen 5-325 MG per tablet  Commonly known as:  PERCOCET/ROXICET  Take 1 tablet by mouth every 6 (six) hours as needed for severe pain.       Allergies  Allergen Reactions  . Benadryl [Diphenhydramine Hcl]     Pt states he just knows he's allergic       Follow-up Information   Follow up with [facility]     On 11/23/2013. (4:00pm; please bring photo ID and all medications you are currently taking)    Contact information:   [address] [place] [address] [phone]      Call VAN Dinah Beers, MD. (to confirm appoinment details)    Specialty:  Cardiothoracic Surgery   Contact information:   [address] [place]  [place] [address] [phone]       Call Thornton Papas, MD. (to confirm appointment details)    Specialty:  Oncology   Contact information:   [address] [place] [address] [phone]        The results of significant diagnostics from this hospitalization (including imaging, microbiology, ancillary and laboratory) are listed below for reference.    Significant Diagnostic Studies: Ct Abdomen Pelvis Wo Contrast  10/24/2013   CLINICAL DATA:  Shock.  Pericardial tamponade.  EXAM: CT ABDOMEN AND PELVIS WITHOUT CONTRAST  TECHNIQUE: Multidetector CT imaging of the abdomen and pelvis was performed following the standard protocol without  intravenous contrast.  COMPARISON:  None.  FINDINGS: The patient has very low body fat and the lack of intravenous and oral contrast markedly limits the diagnostic utility of the scan.  The stomach is distended with fluid. There is a small amount of what appears to be contrast in the cecum. No dilated loops of large or small bowel. No free air in the abdomen. Suggestion of a small amount of free fluid in the pelvis.  Foley catheter in place.  Pericardial drain in place.  No focal liver lesions. Spleen and pancreas appear normal. Adrenal glands are not enlarged. No appreciable renal lesion.  Fairly extensive calcification in the abdominal aorta and iliac arteries. No osseous abnormality.  IMPRESSION: No visible acute abnormality of the abdomen other than a small amount of free fluid in the pelvis. The stomach is distended with fluid. I cannot visualize the gallbladder.   Electronically Signed   By: Geanie Cooley M.D.   On: 10/24/2013 16:27   Dg Chest 2 View  10/29/2013   CLINICAL DATA:  Cough, history of lung cancer  EXAM: CHEST  2 VIEW  COMPARISON:  10/27/2013  FINDINGS: Mild cardiac enlargement is stable. The vascular pattern is normal. There is hazy attenuation over the lower 3rd of the left lungs. There is hazy attenuation over the right lung base. These findings do not appear to be significantly different when compared to the prior study. The lateral view confirms the presence of small right and moderate left pleural effusion with underlying consolidation likely representing passive atelectasis.  IMPRESSION: Bilateral pleural effusions.   Electronically Signed   By: Esperanza Heir M.D.   On: 10/29/2013 09:21   Ct Head W Wo Contrast  10/29/2013   CLINICAL DATA:  57 year old male with newly diagnosed lung cancer. Dizziness. Initial encounter. Staging.  EXAM: CT HEAD WITHOUT AND WITH CONTRAST  TECHNIQUE: Contiguous axial images were obtained from the base of the skull through the vertex without and with  intravenous contrast  CONTRAST:  OMNIPAQUE IOHEXOL 300 MG/ML  SOLN  COMPARISON:  Non contrast head CT 06/13/2007 and earlier.  FINDINGS: Mild paranasal sinus mucosal thickening. Mastoids and tympanic cavities are clear.  Stable visualized osseous structures.  Visualized orbits and scalp soft tissues are within normal limits.  Cerebral volume loss since 2008 with associated mild ventricular enlargement. No ventriculomegaly. No midline shift, mass effect, or evidence of intracranial mass lesion. No acute intracranial hemorrhage identified. No residual extra-axial collection identified. There a small area of cortical encephalomalacia at the right vertex noted on series 4, image 25. No abnormal enhancement identified. Grossly normal enhancement of the major intracranial vascular structures.  IMPRESSION: 1. No acute or metastatic intracranial abnormality identified.  2. 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   Electronically Signed   By: Si Gaul.D.  On: 10/29/2013 11:29   Ct Chest Wo Contrast  10/24/2013   CLINICAL DATA:  Pericardial tamponade.  Shock.  Pulmonary edema.  EXAM: CT CHEST WITHOUT CONTRAST  TECHNIQUE: Multidetector CT imaging of the chest was performed following the standard protocol without IV contrast.  COMPARISON:  Chest x-ray dated 10/24/2013  FINDINGS: The patient has extensive emphysema in the upper lobes. There is a poorly defined 18 x 17 x 13 mm mass at the superior aspect of the left hilum. There is fullness at the inferior aspect of the left hilum which may represent a mass is well but the detail is poor because of the lack of body fat and lack of intravenous contrast. There are small to moderate bilateral pleural effusions. There is atelectasis and partial consolidation in the left lower lobe and atelectasis in the right lower lobe.  There is a catheter in the pericardium with only a small amount of residual pericardial effusion.   Endotracheal tube appears in good position. No acute osseous abnormality.  IMPRESSION: 1. Mass lesion at the superior aspect of the left hilum and possible mass lesion at the inferior aspect of the left hilum. 2. Small to moderate bilateral pleural effusions. Atelectasis and consolidation in the left lower lobe. 3. Small residual pericardial effusion. Pericardial drain in place. Extensive emphysema.   Electronically Signed   By: Geanie Cooley M.D.   On: 10/24/2013 16:24   Dg Chest Port 1 View  10/31/2013   CLINICAL DATA:  Status post pericardial surgery.  EXAM: PORTABLE CHEST - 1 VIEW  COMPARISON:  October 30, 2013.  FINDINGS: Stable cardiomediastinal silhouette. Right lung is clear. No pneumothorax is noted. Stable left basilar opacity is noted consistent with pleural effusion and possible associated atelectasis.  IMPRESSION: Stable left basilar opacity consistent with effusion and possible associated atelectasis.   Electronically Signed   By: Roque Lias M.D.   On: 10/31/2013 09:39   Dg Chest Portable 1 View  10/30/2013   CLINICAL DATA:  Postop.  EXAM: PORTABLE CHEST - 1 VIEW  COMPARISON:  10/29/2013  FINDINGS: Mild hyperinflation of the lungs. Heart is upper limits normal in size. Bilateral lower lobe airspace opacities, left greater than right. Left pleural effusion. Suspect small right effusion. No significant change since prior study.  IMPRESSION: Bilateral effusions and lower lobe opacities, unchanged.  Mild hyperinflation.   Electronically Signed   By: Charlett Nose M.D.   On: 10/30/2013 18:43   Dg Chest Port 1 View  10/27/2013   CLINICAL DATA:  57 year old male. Acute respiratory failure. Initial encounter. Status post treatment of acute pericardial effusion. Lung consolidation.  EXAM: PORTABLE CHEST - 1 VIEW  COMPARISON:  10/26/2013 and earlier.  FINDINGS: Portable AP semi upright view at 0412 hrs. Increased bilateral veiling pulmonary opacity. Continued dense retrocardiac opacity. Stable  cardiac size and mediastinal contours.  The patient is extubated. Pericardial catheter no longer visible. Enteric tube has been removed.  No pneumothorax.  IMPRESSION: 1. Extubated.  Enteric tube and pericardial catheter removed.  2. Increased bilateral veiling pleural effusions and associated bilateral lower lobe predominant atelectasis.   Electronically Signed   By: Augusto Gamble M.D.   On: 10/27/2013 07:14   Dg Chest Port 1 View  10/26/2013   CLINICAL DATA:  Respiratory difficulty  EXAM: PORTABLE CHEST - 1 VIEW  COMPARISON:  10/25/2013  FINDINGS: Tubular structures stable. Left basilar consolidation stable. Hyperaeration. No pneumothorax.  IMPRESSION: Stable.  Left basilar consolidation   Electronically Signed   By: Lenoria Farrier  Hoss M.D.   On: 10/26/2013 07:46   Dg Chest Port 1 View  10/25/2013   CLINICAL DATA:  Intubated patient  EXAM: PORTABLE CHEST - 1 VIEW  COMPARISON:  Chest radiograph and chest CT, 10/24/2013  FINDINGS: Endotracheal tube is stable in well positioned, tip lying 3.7 cm above the Carina. Nasogastric tube passes below the diaphragm into the stomach.  Lungs are hyperexpanded reflecting COPD. Left lung base opacity is a combination of atelectasis and pleural fluid, stable. No pulmonary edema. No new lung opacities.  IMPRESSION: 1. Stable support apparatus. 2. Left pleural effusion with associated atelectasis, also stable. Underlying COPD. No new lung opacities.   Electronically Signed   By: Amie Portland M.D.   On: 10/25/2013 08:16   Dg Chest Portable 1 View  10/24/2013   CLINICAL DATA:  Intubation.  Line placement.  EXAM: PORTABLE CHEST - 1 VIEW  COMPARISON:  07/03/2007  FINDINGS: Endotracheal tube is in place, tip approximately 4.3 cm above chronic. A catheter has an inferior approach, overlying the heart, possibly representing pericardial catheter.  The heart is enlarged. There is dense opacification in the retrocardiac region on the left, consistent with infiltrate or atelectasis. There is  perihilar edema.  IMPRESSION: 1. Interval placement of endotracheal tube and probable pericardial catheter. 2. Cardiomegaly and pulmonary edema. 3. Left lower lobe infiltrate or atelectasis.   Electronically Signed   By: Rosalie Gums M.D.   On: 10/24/2013 15:35    Microbiology: Recent Results (from the past 240 hour(s))  MRSA PCR SCREENING     Status: None   Collection Time    10/24/13  6:30 PM      Result Value Range Status   MRSA by PCR NEGATIVE  NEGATIVE Final   Comment:            The GeneXpert MRSA Assay (FDA     approved for NASAL specimens     only), is one component of a     comprehensive MRSA colonization     surveillance program. It is not     intended to diagnose MRSA     infection nor to guide or     monitor treatment for     MRSA infections.  AFB CULTURE WITH SMEAR     Status: None   Collection Time    10/25/13 12:21 PM      Result Value Range Status   Specimen Description PERICARDIAL FLUID   Final   Special Requests NONE   Final   ACID FAST SMEAR     Final   Value: NO ACID FAST BACILLI SEEN     Performed at [facility]   Culture     Final   Value: CULTURE WILL BE EXAMINED FOR 6 WEEKS BEFORE ISSUING A FINAL REPORT     Performed at [facility]   Report Status PENDING   Incomplete  BODY FLUID CULTURE     Status: None   Collection Time    10/25/13 12:21 PM      Result Value Range Status   Specimen Description PERICARDIAL FLUID   Final   Special Requests NONE   Final   Gram Stain     Final   Value: FEW WBC PRESENT,BOTH PMN AND MONONUCLEAR     NO ORGANISMS SEEN     Performed at [facility]   Culture     Final   Value: NO GROWTH 3 DAYS     Performed at [facility]  Report Status 10/28/2013 FINAL   Final     Labs: Basic Metabolic Panel:  Recent Labs Lab 10/28/13 0730 10/29/13 0605 10/29/13 1913 10/31/13 0430 11/01/13 0513  NA 135 133* 132* 131* 134*  K 3.7 3.5 3.3* 3.7 4.1  CL 103 101 98 101 101  CO2 22 20 22 21  26   GLUCOSE 96 90 115* 129* 80  BUN 11 7 9 14 14   CREATININE 0.60 0.54 0.60 0.62 0.74  CALCIUM 8.4 8.4 8.5 8.1* 8.5  MG 1.9  --   --   --   --   PHOS 2.0*  --   --   --   --    Liver Function Tests:  Recent Labs Lab 10/29/13 0605 10/29/13 1913 11/01/13 0513  AST 342* 254* 69*  ALT 833* 760* 332*  ALKPHOS 109 116 89  BILITOT 0.8 0.7 0.7  PROT 6.1 6.6 6.5  ALBUMIN 2.5* 2.7* 2.7*   CBC:  Recent Labs Lab 10/28/13 0730 10/29/13 0605 10/29/13 1913 10/31/13 0430 11/01/13 0513  WBC 7.3 7.1 6.7 7.9 8.1  HGB 11.3* 12.6* 13.5 12.8* 13.0  HCT 32.5* 35.4* 38.5* 36.6* 37.6*  MCV 90.5 88.3 87.7 89.5 90.2  PLT 288 322 323 340 371    Signed:  Sydne Krahl  [facility] 11/03/2013, 8:38 AM

## 2013-11-11 ENCOUNTER — Other Ambulatory Visit: Payer: Self-pay | Admitting: *Deleted

## 2013-11-11 DIAGNOSIS — I319 Disease of pericardium, unspecified: Secondary | ICD-10-CM

## 2013-11-13 ENCOUNTER — Ambulatory Visit (HOSPITAL_BASED_OUTPATIENT_CLINIC_OR_DEPARTMENT_OTHER): Payer: Medicaid Other | Admitting: Oncology

## 2013-11-13 ENCOUNTER — Encounter: Payer: Self-pay | Admitting: Oncology

## 2013-11-13 ENCOUNTER — Telehealth: Payer: Self-pay | Admitting: Oncology

## 2013-11-13 ENCOUNTER — Ambulatory Visit: Payer: Medicaid Other

## 2013-11-13 VITALS — BP 101/58 | HR 83 | Temp 98.5°F | Resp 20 | Ht 69.0 in | Wt 132.6 lb

## 2013-11-13 DIAGNOSIS — D649 Anemia, unspecified: Secondary | ICD-10-CM

## 2013-11-13 DIAGNOSIS — R222 Localized swelling, mass and lump, trunk: Secondary | ICD-10-CM

## 2013-11-13 DIAGNOSIS — R918 Other nonspecific abnormal finding of lung field: Secondary | ICD-10-CM

## 2013-11-13 DIAGNOSIS — I319 Disease of pericardium, unspecified: Secondary | ICD-10-CM

## 2013-11-13 DIAGNOSIS — C801 Malignant (primary) neoplasm, unspecified: Secondary | ICD-10-CM

## 2013-11-13 DIAGNOSIS — M545 Low back pain: Secondary | ICD-10-CM

## 2013-11-13 NOTE — Progress Notes (Signed)
   [facility]    OFFICE PROGRESS NOTE   INTERVAL HISTORY:   I saw Philip Andrews when he was admitted last month with cardiac tamponade. He underwent a pericardiocentesis procedure on 10/24/2013 and the cytology confirmed malignant cells consistent with adenocarcinoma. The cells stained negative for TTF-1 and Napsin-A.  He was taken to a pericardial window procedure and bronchoscopy on 10/31/2013. No endobronchial lesions were noted. The pericardium resection revealed no tumor. Bronchial washings on the left upper lobe were negative. Bronchial brushings from the left upper lobe were negative.  He was discharged 11/03/2013. He reports feeling much better compared to hospital admission. The dyspnea has improved. He complains of pain at the right lower back. The pain was present prior to hospital admission. Oxycodone helps the pain. No other complaint.  Objective:  Vital signs in last 24 hours:  Blood pressure 101/58, pulse 83, temperature 98.5 F (36.9 C), temperature source Oral, resp. rate 20, height 5\' 9"  (1.753 m), weight 132 lb 9.6 oz (60.147 kg).    HEENT: Neck without mass Lymphatics: No cervical or supraclavicular nodes. "Shotty "axillary nodes bilaterally. Resp: Lungs clear bilaterally, no respiratory distress Cardio: Regular rate and rhythm GI: No hepatomegaly, nontender Vascular: No leg edema Musculoskeletal: Tender to percussion and palpation at the right low paraspinous musculature and right flank. No mass.  Skin: Healing incision at the upper abdomen with sutures in place    Medications: I have reviewed the patient's current medications.  Assessment/Plan: 1. Metastatic adenocarcinoma involving a pericardial effusion , TTF-1 and Napsin-A negative Status post a pericardial window procedure and bronchoscopy on 10/31/2013. No tumor found at bronchoscopy and the pericardial tissue was negative for malignancy. 2. Respiratory failure/cardiac tamponade secondary to  #1-resolved  3. History of polysubstance abuse  4. Acute renal failure-resolved  5. Mild normocytic anemia  6. Left hilar mass on the noncontrast chest CT 10/24/2013  7. right low back/flank discomfort   Disposition:  Philip Andrews appears well today. He will continue Percocet as needed for the right low back pain. He is scheduled to see Dr.Van Trigt on 11/16/2013. He will undergo a staging PET scan on 11/17/2013. We will see him for an office visit after the PET scan on 11/17/2013. I discussed the differential diagnosis with Philip Andrews and his son. We initially felt he most likely had lung cancer based on the clinical presentation and smoking history. However the bronchoscopy and diagnostic workup to date has not revealed a primary lung tumor.  Philip Papas, MD  11/13/2013  4:54 PM

## 2013-11-13 NOTE — Progress Notes (Signed)
Checked in new pt with no insurance.  Pt stated he has applied for Medicaid and is awaiting approval.

## 2013-11-13 NOTE — Telephone Encounter (Signed)
appts made per 12/5 POF Email to Misty Stanley to advise about TOA Dr Truett Perna said for pt to see her on 12/9 Promised pt I would call when I hear back from  [place]. shh

## 2013-11-16 ENCOUNTER — Telehealth: Payer: Self-pay | Admitting: Oncology

## 2013-11-16 ENCOUNTER — Ambulatory Visit
Admission: RE | Admit: 2013-11-16 | Discharge: 2013-11-16 | Disposition: A | Discharge status: Home / Self Care | Payer: Medicaid Other | Source: Ambulatory Visit | Attending: Cardiothoracic Surgery | Admitting: Cardiothoracic Surgery

## 2013-11-16 ENCOUNTER — Ambulatory Visit: Payer: Self-pay

## 2013-11-16 ENCOUNTER — Ambulatory Visit (INDEPENDENT_AMBULATORY_CARE_PROVIDER_SITE_OTHER): Payer: Medicaid Other | Admitting: Surgical

## 2013-11-16 VITALS — BP 118/76 | HR 104 | Resp 20 | Ht 69.0 in | Wt 132.0 lb

## 2013-11-16 DIAGNOSIS — C801 Malignant (primary) neoplasm, unspecified: Secondary | ICD-10-CM

## 2013-11-16 DIAGNOSIS — I314 Cardiac tamponade: Secondary | ICD-10-CM

## 2013-11-16 DIAGNOSIS — I319 Disease of pericardium, unspecified: Secondary | ICD-10-CM

## 2013-11-16 DIAGNOSIS — Z09 Encounter for follow-up examination after completed treatment for conditions other than malignant neoplasm: Secondary | ICD-10-CM

## 2013-11-16 NOTE — Patient Instructions (Signed)
Follow up with oncology as a request.

## 2013-11-16 NOTE — Telephone Encounter (Signed)
[facility]MM on mobile ph adv appt w Lonna Cobb after PET tomorrow 12/9 at 2:45 pm shh

## 2013-11-16 NOTE — Progress Notes (Signed)
[address]       [place] [address]             [phone]                  KAIZER DISSINGER [facility] Medical Record #[id number] Date of Birth: [birth year]  Ladene Artist, MD Vassie Loll, MD  Chief Complaint:   PostOp Follow Up Visit   History of Present Illness:    Patient is seen in routine followup office visit status post subxiphoid pericardial window on 10/30/2013 by Dr. Donata Clay. Pathology reports atypia, but no definite malignancy. He has a left-sided hilar mass and is followed by oncology. He is scheduled for PET scan tomorrow. He has some mild discomfort radiating to his back. He denies shortness of breath.           History  Smoking status  . Current Every Day Smoker -- 1.00 packs/day for 40 years  . Types: Cigarettes  Smokeless tobacco  . Not on file       Allergies  Allergen Reactions  . Benadryl [Diphenhydramine Hcl]     Pt states he just knows he's allergic    Current Outpatient Prescriptions  Medication Sig Dispense Refill  . escitalopram (LEXAPRO) 10 MG tablet Take 1 tablet (10 mg total) by mouth daily.  30 tablet  1  . feeding supplement, ENSURE COMPLETE, (ENSURE COMPLETE) LIQD Take 237 mLs by mouth 3 (three) times daily between meals.      . Ipratropium-Albuterol (COMBIVENT) 20-100 MCG/ACT AERS respimat Inhale 1 puff into the lungs every 6 (six) hours as needed for wheezing or shortness of breath.  1 Inhaler  2  . oxyCODONE-acetaminophen (PERCOCET/ROXICET) 5-325 MG per tablet Take 1 tablet by mouth every 6 (six) hours as needed for severe pain.  30 tablet  0   No current facility-administered medications for this visit.       Physical Exam: BP 118/76  Pulse 104  Resp 20  Ht 5\' 9"  (1.753 m)  Wt 132 lb (59.875 kg)  BMI 19.48 kg/m2  SpO2 95%  General appearance: alert, cooperative and no distress Heart: regular rate and rhythm Lungs: clear to auscultation bilaterally Wound: Incision healing well without  evidence of infection  Diagnostic Studies & Laboratory data:         Recent Radiology Findings: Dg Chest 2 View  11/16/2013   CLINICAL DATA:  Shortness of breath. History of pericardial effusion.  EXAM: CHEST  2 VIEW  COMPARISON:  10/31/2013  FINDINGS: Borderline fullness of the hila. Cardiac and mediastinal margins appear normal. Emphysema noted. No pleural effusion.  A nodular density projecting between the left 5th and 6th posterior ribs is shown on the CT scan of 10/24/2013 to be a small area of bony bridging.  IMPRESSION: 1. Emphysema, likely with secondary pulmonary hypertension given the hilar prominence. Heart size within normal limits. Prior pleural effusions have cleared.   Electronically Signed   By: Herbie Baltimore M.D.   On: 11/16/2013 14:58      Recent Labs: Lab Results  Component Value Date   WBC 8.1 11/01/2013   HGB 13.0 11/01/2013   HCT 37.6* 11/01/2013   PLT 371 11/01/2013   GLUCOSE 80 11/01/2013   ALT 332* 11/01/2013   AST 69* 11/01/2013   NA 134* 11/01/2013   K 4.1 11/01/2013   CL 101 11/01/2013   CREATININE 0.74 11/01/2013   BUN 14 11/01/2013   CO2 26 11/01/2013  INR 1.21 10/29/2013      Assessment / Plan:  Patient is stable from a surgical viewpoint. I removed his chest tube suture. He will continue ongoing evaluation by oncology.          Raymie Giammarco E 11/16/2013 3:10 PM

## 2013-11-17 ENCOUNTER — Encounter (HOSPITAL_COMMUNITY): Payer: Self-pay

## 2013-11-17 ENCOUNTER — Encounter (HOSPITAL_COMMUNITY)
Admission: RE | Admit: 2013-11-17 | Discharge: 2013-11-17 | Disposition: A | Discharge status: Home / Self Care | Payer: Medicaid Other | Source: Ambulatory Visit | Attending: Internal Medicine | Admitting: Internal Medicine

## 2013-11-17 ENCOUNTER — Ambulatory Visit (HOSPITAL_BASED_OUTPATIENT_CLINIC_OR_DEPARTMENT_OTHER): Payer: Medicaid Other | Admitting: Nurse Practitioner

## 2013-11-17 ENCOUNTER — Telehealth: Payer: Self-pay | Admitting: Oncology

## 2013-11-17 DIAGNOSIS — C50919 Malignant neoplasm of unspecified site of unspecified female breast: Secondary | ICD-10-CM

## 2013-11-17 DIAGNOSIS — C3410 Malignant neoplasm of upper lobe, unspecified bronchus or lung: Secondary | ICD-10-CM

## 2013-11-17 DIAGNOSIS — M545 Low back pain: Secondary | ICD-10-CM

## 2013-11-17 DIAGNOSIS — R911 Solitary pulmonary nodule: Secondary | ICD-10-CM

## 2013-11-17 DIAGNOSIS — R599 Enlarged lymph nodes, unspecified: Secondary | ICD-10-CM | POA: Insufficient documentation

## 2013-11-17 HISTORY — DX: Other nonspecific abnormal finding of lung field: R91.8

## 2013-11-17 MED ORDER — FLUDEOXYGLUCOSE F - 18 (FDG) INJECTION
15.0000 | Freq: Once | INTRAVENOUS | Status: AC | PRN
  Administered 2013-11-17: 15 via INTRAVENOUS

## 2013-11-17 MED ORDER — OXYCODONE-ACETAMINOPHEN 5-325 MG PO TABS: 1.0000 | ORAL_TABLET | Freq: Four times a day (QID) | ORAL | Status: DC | PRN

## 2013-11-17 NOTE — Telephone Encounter (Signed)
worked 12/9 POF Rad Onc Dr Roselind Messier to call pt w appt for next week  AVS and DEC cal given Took son to Karel Jarvis to discuss FMLA papers bc he transports Dad here shh

## 2013-11-17 NOTE — Progress Notes (Addendum)
OFFICE PROGRESS NOTE  Interval history:  Philip Andrews returns for followup of metastatic adenocarcinoma. He continues to have pain at the right flank region. He is taking Percocet as needed. His appetite is poor. He denies nausea/vomiting. No diarrhea. He denies shortness of breath. He notes blood with nose blowing.   Objective: Blood pressure 100/61, pulse 85, temperature 97.7 F (36.5 C), temperature source Oral, resp. rate 20, height 5\' 9"  (1.753 m), weight 131 lb 1.6 oz (59.467 kg).  Oropharynx is without thrush or ulceration. Lungs are clear. Regular cardiac rhythm. Abdomen is soft and nontender. No hepatomegaly. No leg edema. Tender over the right flank region. Asymmetric soft tissue fullness at the right flank.  Lab Results: Lab Results  Component Value Date   WBC 8.1 11/01/2013   HGB 13.0 11/01/2013   HCT 37.6* 11/01/2013   MCV 90.2 11/01/2013   PLT 371 11/01/2013    Chemistry:    Chemistry      Component Value Date/Time   NA 134* 11/01/2013 0513   K 4.1 11/01/2013 0513   CL 101 11/01/2013 0513   CO2 26 11/01/2013 0513   BUN 14 11/01/2013 0513   CREATININE 0.74 11/01/2013 0513      Component Value Date/Time   CALCIUM 8.5 11/01/2013 0513   ALKPHOS 89 11/01/2013 0513   AST 69* 11/01/2013 0513   ALT 332* 11/01/2013 0513   BILITOT 0.7 11/01/2013 0513       Studies/Results: Ct Abdomen Pelvis Wo Contrast  10/24/2013   CLINICAL DATA:  Shock.  Pericardial tamponade.  EXAM: CT ABDOMEN AND PELVIS WITHOUT CONTRAST  TECHNIQUE: Multidetector CT imaging of the abdomen and pelvis was performed following the standard protocol without intravenous contrast.  COMPARISON:  None.  FINDINGS: The patient has very low body fat and the lack of intravenous and oral contrast markedly limits the diagnostic utility of the scan.  The stomach is distended with fluid. There is a small amount of what appears to be contrast in the cecum. No dilated loops of large or small bowel. No free air in the  abdomen. Suggestion of a small amount of free fluid in the pelvis.  Foley catheter in place.  Pericardial drain in place.  No focal liver lesions. Spleen and pancreas appear normal. Adrenal glands are not enlarged. No appreciable renal lesion.  Fairly extensive calcification in the abdominal aorta and iliac arteries. No osseous abnormality.  IMPRESSION: No visible acute abnormality of the abdomen other than a small amount of free fluid in the pelvis. The stomach is distended with fluid. I cannot visualize the gallbladder.   Electronically Signed   By: Geanie Cooley M.D.   On: 10/24/2013 16:27   Dg Chest 2 View  11/16/2013   CLINICAL DATA:  Shortness of breath. History of pericardial effusion.  EXAM: CHEST  2 VIEW  COMPARISON:  10/31/2013  FINDINGS: Borderline fullness of the hila. Cardiac and mediastinal margins appear normal. Emphysema noted. No pleural effusion.  A nodular density projecting between the left 5th and 6th posterior ribs is shown on the CT scan of 10/24/2013 to be a small area of bony bridging.  IMPRESSION: 1. Emphysema, likely with secondary pulmonary hypertension given the hilar prominence. Heart size within normal limits. Prior pleural effusions have cleared.   Electronically Signed   By: Herbie Baltimore M.D.   On: 11/16/2013 14:58   Dg Chest 2 View  10/29/2013   CLINICAL DATA:  Cough, history of lung cancer  EXAM: CHEST  2 VIEW  COMPARISON:  10/27/2013  FINDINGS: Mild cardiac enlargement is stable. The vascular pattern is normal. There is hazy attenuation over the lower 3rd of the left lungs. There is hazy attenuation over the right lung base. These findings do not appear to be significantly different when compared to the prior study. The lateral view confirms the presence of small right and moderate left pleural effusion with underlying consolidation likely representing passive atelectasis.  IMPRESSION: Bilateral pleural effusions.   Electronically Signed   By: Esperanza Heir M.D.   On:  10/29/2013 09:21   Ct Head W Wo Contrast  10/29/2013   CLINICAL DATA:  57 year old male with newly diagnosed lung cancer. Dizziness. Initial encounter. Staging.  EXAM: CT HEAD WITHOUT AND WITH CONTRAST  TECHNIQUE: Contiguous axial images were obtained from the base of the skull through the vertex without and with intravenous contrast  CONTRAST:  OMNIPAQUE IOHEXOL 300 MG/ML  SOLN  COMPARISON:  Non contrast head CT 06/13/2007 and earlier.  FINDINGS: Mild paranasal sinus mucosal thickening. Mastoids and tympanic cavities are clear.  Stable visualized osseous structures.  Visualized orbits and scalp soft tissues are within normal limits.  Cerebral volume loss since 2008 with associated mild ventricular enlargement. No ventriculomegaly. No midline shift, mass effect, or evidence of intracranial mass lesion. No acute intracranial hemorrhage identified. No residual extra-axial collection identified. There a small area of cortical encephalomalacia at the right vertex noted on series 4, image 25. No abnormal enhancement identified. Grossly normal enhancement of the major intracranial vascular structures.  IMPRESSION: 1. No acute or metastatic intracranial abnormality identified.  2. 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   Electronically Signed   By: Augusto Gamble M.D.   On: 10/29/2013 11:29   Ct Chest Wo Contrast  10/24/2013   CLINICAL DATA:  Pericardial tamponade.  Shock.  Pulmonary edema.  EXAM: CT CHEST WITHOUT CONTRAST  TECHNIQUE: Multidetector CT imaging of the chest was performed following the standard protocol without IV contrast.  COMPARISON:  Chest x-ray dated 10/24/2013  FINDINGS: The patient has extensive emphysema in the upper lobes. There is a poorly defined 18 x 17 x 13 mm mass at the superior aspect of the left hilum. There is fullness at the inferior aspect of the left hilum which may represent a mass is well but the detail is poor because of the lack of  body fat and lack of intravenous contrast. There are small to moderate bilateral pleural effusions. There is atelectasis and partial consolidation in the left lower lobe and atelectasis in the right lower lobe.  There is a catheter in the pericardium with only a small amount of residual pericardial effusion.  Endotracheal tube appears in good position. No acute osseous abnormality.  IMPRESSION: 1. Mass lesion at the superior aspect of the left hilum and possible mass lesion at the inferior aspect of the left hilum. 2. Small to moderate bilateral pleural effusions. Atelectasis and consolidation in the left lower lobe. 3. Small residual pericardial effusion. Pericardial drain in place. Extensive emphysema.   Electronically Signed   By: Geanie Cooley M.D.   On: 10/24/2013 16:24   Nm Pet Image Initial (pi) Skull Base To Thigh  11/17/2013   CLINICAL DATA:  Initial treatment strategy for left upper lobe pulmonary nodule. Pericardial resection 10/31/2013 demonstrated inflammation and granulation tissue, but no evidence of malignancy. Negative left upper lobe bronchial brushings. Malignant pericardial effusion consistent with adenocarcinoma.  EXAM: NUCLEAR MEDICINE PET SKULL BASE TO THIGH  FASTING BLOOD GLUCOSE:  Value: 87mg /dl  TECHNIQUE: 16.1 mCi W-96 FDG was injected intravenously. CT data was obtained and used for attenuation correction and anatomic localization only. (This was not acquired as a diagnostic CT examination.) Additional exam technical data entered on technologist worksheet.  COMPARISON:  CTs of the chest, abdomen and pelvis 10/24/2013.  FINDINGS: NECK  No hypermetabolic lymph nodes in the neck.  CHEST  The left upper lobe suprahilar nodule measuring 1.6 cm on image 83 is hypermetabolic with an SUV max of 9.1. Medial to this nodule is a 6 mm nodule on image 78, not showing any increased metabolic activity. There are multiple hypermetabolic small mediastinal and hilar lymph nodes bilaterally. The greatest  activity is in the subcarinal station with an SUV max of 6.8. Additional nodes are present in the AP window, prevascular space and both hila.  ABDOMEN/PELVIS  There is no abnormal metabolic activity within the liver, adrenal glands, pancreas or spleen. There is linear subxiphoid activity attributed to the prior pericardial resection. Within the soft tissues of the right back, there is focally increased metabolic activity. This lies just posterior to collecting system activity in the right kidney, but cannot be explained on the basis of motion. This has an SUV max of 6.7 and is suspicious for a soft tissue metastasis. No focal lesion is apparent on the CT images. There is no hypermetabolic nodal activity within the abdomen or pelvis.  SKELETON  No distinct osseous metastases are identified. There is mildly increased activity at the right 11th costovertebral junction which may be inflammatory.  IMPRESSION: 1. Left upper lobe suprahilar pulmonary nodule is hypermetabolic consistent with malignancy. This may reflect primary bronchogenic carcinoma. 2. There are several mildly enlarged hypermetabolic mediastinal and hilar lymph nodes bilaterally suspicious for nodal metastases. 3. Hypermetabolic activity in the right back, posterior to the right kidney, suspicious for a soft tissue metastasis.   Electronically Signed   By: Roxy Horseman M.D.   On: 11/17/2013 16:47   Dg Chest Port 1 View  10/31/2013   CLINICAL DATA:  Status post pericardial surgery.  EXAM: PORTABLE CHEST - 1 VIEW  COMPARISON:  October 30, 2013.  FINDINGS: Stable cardiomediastinal silhouette. Right lung is clear. No pneumothorax is noted. Stable left basilar opacity is noted consistent with pleural effusion and possible associated atelectasis.  IMPRESSION: Stable left basilar opacity consistent with effusion and possible associated atelectasis.   Electronically Signed   By: Roque Lias M.D.   On: 10/31/2013 09:39   Dg Chest Portable 1  View  10/30/2013   CLINICAL DATA:  Postop.  EXAM: PORTABLE CHEST - 1 VIEW  COMPARISON:  10/29/2013  FINDINGS: Mild hyperinflation of the lungs. Heart is upper limits normal in size. Bilateral lower lobe airspace opacities, left greater than right. Left pleural effusion. Suspect small right effusion. No significant change since prior study.  IMPRESSION: Bilateral effusions and lower lobe opacities, unchanged.  Mild hyperinflation.   Electronically Signed   By: Charlett Nose M.D.   On: 10/30/2013 18:43   Dg Chest Port 1 View  10/27/2013   CLINICAL DATA:  57 year old male. Acute respiratory failure. Initial encounter. Status post treatment of acute pericardial effusion. Lung consolidation.  EXAM: PORTABLE CHEST - 1 VIEW  COMPARISON:  10/26/2013 and earlier.  FINDINGS: Portable AP semi upright view at 0412 hrs. Increased bilateral veiling pulmonary opacity. Continued dense retrocardiac opacity. Stable cardiac size and mediastinal contours.  The patient is extubated. Pericardial catheter no longer visible. Enteric tube has been  removed.  No pneumothorax.  IMPRESSION: 1. Extubated.  Enteric tube and pericardial catheter removed.  2. Increased bilateral veiling pleural effusions and associated bilateral lower lobe predominant atelectasis.   Electronically Signed   By: Augusto Gamble M.D.   On: 10/27/2013 07:14   Dg Chest Port 1 View  10/26/2013   CLINICAL DATA:  Respiratory difficulty  EXAM: PORTABLE CHEST - 1 VIEW  COMPARISON:  10/25/2013  FINDINGS: Tubular structures stable. Left basilar consolidation stable. Hyperaeration. No pneumothorax.  IMPRESSION: Stable.  Left basilar consolidation   Electronically Signed   By: Maryclare Bean M.D.   On: 10/26/2013 07:46   Dg Chest Port 1 View  10/25/2013   CLINICAL DATA:  Intubated patient  EXAM: PORTABLE CHEST - 1 VIEW  COMPARISON:  Chest radiograph and chest CT, 10/24/2013  FINDINGS: Endotracheal tube is stable in well positioned, tip lying 3.7 cm above the Carina.  Nasogastric tube passes below the diaphragm into the stomach.  Lungs are hyperexpanded reflecting COPD. Left lung base opacity is a combination of atelectasis and pleural fluid, stable. No pulmonary edema. No new lung opacities.  IMPRESSION: 1. Stable support apparatus. 2. Left pleural effusion with associated atelectasis, also stable. Underlying COPD. No new lung opacities.   Electronically Signed   By: Amie Portland M.D.   On: 10/25/2013 08:16   Dg Chest Portable 1 View  10/24/2013   CLINICAL DATA:  Intubation.  Line placement.  EXAM: PORTABLE CHEST - 1 VIEW  COMPARISON:  07/03/2007  FINDINGS: Endotracheal tube is in place, tip approximately 4.3 cm above chronic. A catheter has an inferior approach, overlying the heart, possibly representing pericardial catheter.  The heart is enlarged. There is dense opacification in the retrocardiac region on the left, consistent with infiltrate or atelectasis. There is perihilar edema.  IMPRESSION: 1. Interval placement of endotracheal tube and probable pericardial catheter. 2. Cardiomegaly and pulmonary edema. 3. Left lower lobe infiltrate or atelectasis.   Electronically Signed   By: Rosalie Gums M.D.   On: 10/24/2013 15:35    Medications: I have reviewed the patient's current medications.  Assessment/Plan:  1. Metastatic adenocarcinoma involving a pericardial effusion, TTF-1 and Napsin-A negative.   Status post pericardial window procedure and bronchoscopy on 10/31/2013. No tumor found at bronchoscopy and the pericardial tissue was negative for malignancy.   Staging PET scan 11/17/2013 with a hypermetabolic 1.6 cm left upper lobe suprahilar nodule with SUV max 9.1; medial to that nodule is a 6 mm nodule not showing any increased metabolic activity; multiple hypermetabolic small mediastinal and hilar lymph nodes bilaterally; additional nodes present in the AP window, prevascular space and both hila; focally increased metabolic activity within the soft tissues of  the right back with SUV max 6.7. 2. Respiratory failure/cardiac tamponade secondary to #1. Resolved. 3. History of polysubstance abuse. 4. Acute renal failure. Resolved. 5. Mild normocytic anemia. 6. Left hilar mass on the noncontrast chest CT 10/24/2013. 7. Right low back/flank pain. Likely secondary to a soft tissue metastasis at the right back.  Disposition-Philip Andrews appears to have metastatic non-small cell lung cancer. He is symptomatic with pain at the right low back. This is likely secondary to a soft tissue metastasis. Dr. Truett Perna spoke with Dr. Roselind Messier regarding a course of palliative radiation. Radiation oncology will see him next week. He will continue Percocet as needed.  We will request testing on the tumor cells for ALK and EGFR mutation status.  We will see Philip Andrews in followup in approximately 2 weeks  and will discuss systemic therapy at that time.    Patient seen with Dr. Truett Perna. Dr. Truett Perna reviewed the PET scan report and images on the computer with Philip Andrews and his son.  25 minutes were spent face-to-face at today's visit with the majority of that time involved in counseling/coordination of care.  Lonna Cobb ANP/GNP-BC   This was a shared visit with Lonna Cobb. I reviewed the PET scan images with Philip Andrews and his son. The clinical presentation and x-ray findings are consistent with a diagnosis of non-small cell lung cancer. He has stage IV non-small cell lung cancer.  We discussed treatment options. He is symptomatic with pain. We will make a referral to Dr. Roselind Messier to consider palliative radiation to the right flank mass. We will see Mr. Severns after radiation to discuss systemic treatment options. We will ask pathology to submit ALK and EGFR mutation testing.  Mancel Bale, M.D.  Addendum 12/04/2013--ALK gene rearrangement not detected. EGFR alteration not detected.

## 2013-11-18 ENCOUNTER — Other Ambulatory Visit (HOSPITAL_COMMUNITY)
Admission: RE | Admit: 2013-11-18 | Discharge: 2013-11-18 | Disposition: A | Discharge status: Home / Self Care | Payer: Medicaid Other | Source: Ambulatory Visit | Attending: Oncology | Admitting: Oncology

## 2013-11-18 DIAGNOSIS — C3490 Malignant neoplasm of unspecified part of unspecified bronchus or lung: Secondary | ICD-10-CM | POA: Insufficient documentation

## 2013-11-19 ENCOUNTER — Encounter (HOSPITAL_COMMUNITY): Payer: Self-pay | Admitting: Cardiothoracic Surgery

## 2013-11-20 NOTE — Progress Notes (Signed)
Thoracic Location of Tumor / Histology:  11/17/13 Pet scan; 1. Left upper lobe suprahilar pulmonary nodule is hypermetabolic consistent with malignancy. This may reflect primary bronchogenic  carcinoma. 2. There are several mildly enlarged hypermetabolic mediastinal and hilar lymph nodes bilaterally suspicious for nodal metastases. 3. Hypermetabolic activity in the right back, posterior to the right kidney, suspicious for a soft tissue metastasis.  10/31/13 :Specimen(s) Obtained:Pericardium, resectionSpecimen Clinical Informationmetastatic adenocarcinoma involving a pericardial effusion (cm)  Patient presented respiratory failure /cardiac tamponade admitted to the hospital 10/24/13  Biopsies of pericardial fluid 10/31/13 :Specimen(s) Obtained:Pericardium, resectionSpecimen Clinical Information metastatic adenocarcinoma involving a pericardial effusion (cm)Diagnosis Pericardium, resection - INFLAMMATION AND GRANULATION TISSUE WITH EPITHELIAL ATYPIA, SEE COMMENT. - DEFINITIVE MALIGNANCY IS NOT IDENTIFIED. Microscopic Comment Sections show a largely eroded surface with atypical epithelium present. There is associated acute    10/24/13: Malignant cells consistent with Adenocarcinoma,   Tobacco/Marijuana/Snuff/ETOH use: Has smoked 2 packs per day since he was 13.  Used to drink wine/whiskey.  Has used crack cocaine in the past.  Past/Anticipated interventions by cardiothoracic surgery, if ZOX:[id number]: 10/30/13 - SUBXYPHOID PERICARDIAL WINDOW;  Surgeon: Kerin Perna, MD;  Location: [facility] OR;  Service: Thoracic;  10/30/13 - Procedure: VIDEO BRONCHOSCOPY;  Surgeon: Kerin Perna, MD;  Location: [facility] OR;  Service: Thoracic;    Past/Anticipated interventions by medical oncology, if any: Sees Dr. Truett Perna.  Signs/Symptoms  Weight changes, if any: has lost about 30 lbs since November.  Respiratory complaints, if any: has nose bleeds from his right nostril every morning.  Hemoptysis, if any:  none  Pain issues, if any:  Right back - rates at a 8/10  SAFETY ISSUES:  Prior radiation? no  Pacemaker/ICD? no  Possible current pregnancy? no  Is the patient on methotrexate? no  Current Complaints / other details:  Here with his son - has two children.  Not married.

## 2013-11-23 ENCOUNTER — Inpatient Hospital Stay: Payer: Self-pay | Admitting: Internal Medicine

## 2013-11-24 ENCOUNTER — Encounter (HOSPITAL_COMMUNITY): Payer: Self-pay

## 2013-11-24 ENCOUNTER — Encounter: Payer: Self-pay | Admitting: Radiation Oncology

## 2013-11-24 ENCOUNTER — Ambulatory Visit
Admission: RE | Admit: 2013-11-24 | Discharge: 2013-11-24 | Disposition: A | Discharge status: Home / Self Care | Payer: Medicaid Other | Source: Ambulatory Visit | Attending: Radiation Oncology | Admitting: Radiation Oncology

## 2013-11-24 ENCOUNTER — Telehealth: Payer: Self-pay | Admitting: *Deleted

## 2013-11-24 VITALS — BP 103/63 | HR 65 | Temp 98.5°F | Resp 16 | Wt 134.5 lb

## 2013-11-24 DIAGNOSIS — R918 Other nonspecific abnormal finding of lung field: Secondary | ICD-10-CM

## 2013-11-24 DIAGNOSIS — M799 Soft tissue disorder, unspecified: Secondary | ICD-10-CM | POA: Insufficient documentation

## 2013-11-24 DIAGNOSIS — F17200 Nicotine dependence, unspecified, uncomplicated: Secondary | ICD-10-CM | POA: Insufficient documentation

## 2013-11-24 DIAGNOSIS — R109 Unspecified abdominal pain: Secondary | ICD-10-CM | POA: Insufficient documentation

## 2013-11-24 DIAGNOSIS — I318 Other specified diseases of pericardium: Secondary | ICD-10-CM | POA: Insufficient documentation

## 2013-11-24 DIAGNOSIS — J90 Pleural effusion, not elsewhere classified: Secondary | ICD-10-CM | POA: Insufficient documentation

## 2013-11-24 DIAGNOSIS — C3490 Malignant neoplasm of unspecified part of unspecified bronchus or lung: Secondary | ICD-10-CM | POA: Insufficient documentation

## 2013-11-24 DIAGNOSIS — R599 Enlarged lymph nodes, unspecified: Secondary | ICD-10-CM | POA: Insufficient documentation

## 2013-11-24 DIAGNOSIS — J438 Other emphysema: Secondary | ICD-10-CM | POA: Insufficient documentation

## 2013-11-24 HISTORY — DX: Gastric ulcer, unspecified as acute or chronic, without hemorrhage or perforation: K25.9

## 2013-11-24 NOTE — Progress Notes (Signed)
Radiation Oncology         (336) [phone] ________________________________  Initial outpatient Consultation  Name: Philip Andrews MRN: [id number]  Date: 11/24/2013  DOB: December 24, 1955  WU:[id number],YNWGNF, MD  Ladene Artist, MD   REFERRING PHYSICIAN: Ladene Artist, MD  DIAGNOSIS: Stage IV non-small cell lung cancer  HISTORY OF PRESENT ILLNESS::Philip Andrews is a 57 y.o. male who is seen out of the courtesy of Dr. Mancel Bale for an opinion concerning radiation therapy as part of management of patient's advanced non-small cell lung cancer. Patient presented with acute respiratory failure. The patient was found to have a pericardial effusion and proceeded to undergo a pericardial window. The pericardial fluid did show malignant cells consistent with adenocarcinoma. Staging workup with PET scan was recently completed which shows a primary site in the left lung with hilar and mediastinal lymphadenopathy. The PET scan also shows hypermetabolic activity within the right back posterior to the right kidney suspicious for soft tissue metastasis.  patient has been having pain in this area and is now seen in radiation oncology for consideration for palliative treatment.  PREVIOUS RADIATION THERAPY: No  PAST MEDICAL HISTORY:  has a past medical history of Substance abuse; Lung nodules; Lung nodules; and Gastric ulcer.    PAST SURGICAL HISTORY: Past Surgical History  Procedure Laterality Date  . Subxyphoid pericardial window N/A 10/30/2013    Procedure: SUBXYPHOID PERICARDIAL WINDOW;  Surgeon: Kerin Perna, MD;  Location: [facility] OR;  Service: Thoracic;  Laterality: N/A;  . Video bronchoscopy N/A 10/30/2013    Procedure: VIDEO BRONCHOSCOPY;  Surgeon: Kerin Perna, MD;  Location: [facility] OR;  Service: Thoracic;  Laterality: N/A;    FAMILY HISTORY: family history is not on file.  SOCIAL HISTORY:  reports that he has been smoking Cigarettes.  He has a 1.76 pack-year smoking history. He does not have any  smokeless tobacco history on file. He reports that he drinks about 1.2 ounces of alcohol per week. He reports that he does not use illicit drugs.  ALLERGIES: Benadryl  MEDICATIONS:  Current Outpatient Prescriptions  Medication Sig Dispense Refill  . escitalopram (LEXAPRO) 10 MG tablet Take 1 tablet (10 mg total) by mouth daily.  30 tablet  1  . feeding supplement, ENSURE COMPLETE, (ENSURE COMPLETE) LIQD Take 237 mLs by mouth 3 (three) times daily between meals.      . Ipratropium-Albuterol (COMBIVENT) 20-100 MCG/ACT AERS respimat Inhale 1 puff into the lungs every 6 (six) hours as needed for wheezing or shortness of breath.  1 Inhaler  2  . oxyCODONE-acetaminophen (PERCOCET/ROXICET) 5-325 MG per tablet Take 1-2 tablets by mouth every 6 (six) hours as needed for severe pain.  60 tablet  0   No current facility-administered medications for this encounter.    REVIEW OF SYSTEMS:  A 15 point review of systems is documented in the electronic medical record. This was obtained by the nursing staff. However, I reviewed this with the patient to discuss relevant findings and make appropriate changes. He denies any headaches blurred vision or double vision. His breathing is much better since his procedure as above. He complains of pain in the right flank region which is quite debilitating for him.   PHYSICAL EXAM:  weight is 134 lb 8 oz (61.009 kg). His oral temperature is 98.5 F (36.9 C). His blood pressure is 103/63 and his pulse is 65. His respiration is 16.   BP 103/63  Pulse 65  Temp(Src) 98.5 F (36.9 C) (Oral)  Resp 16  Wt 134 lb 8 oz (61.009 kg)  General Appearance:    Alert, cooperative, no distress, appears stated age, accompanied by a son on evaluation today   Head:    Normocephalic, without obvious abnormality, atraumatic  Eyes:    PERRL, conjunctiva/corneas clear, EOM's intact,         Ears:    Normal TM's and external ear canals, both ears  Nose:   Nares normal, septum midline,  mucosa normal, no drainage    or sinus tenderness  Throat:   Lips, mucosa, and tongue normal; 4 teeth remaining in the anterior mandible region gums normal  Neck:   Supple, symmetrical, trachea midline, no adenopathy;       thyroid:  No enlargement/tenderness/nodules; no carotid   bruit or JVD  Back:     Symmetric, no curvature, ROM normal, no CVA tenderness  Lungs:     Clear to auscultation bilaterally, respirations unlabored  Chest wall:    No tenderness or deformity, scar in the xiphoid area from recent pericardial window   Heart:    Regular rate and rhythm, S1 and S2 normal, no murmur, rub   or gallop  Abdomen:     Soft, non-tender, bowel sounds active all four quadrants,    no masses, no organomegaly        Extremities:   Extremities normal, atraumatic, no cyanosis or edema  Pulses:   2+ and symmetric all extremities  Skin:   Skin color, texture, turgor normal, no rashes or lesions  Lymph nodes:   Cervical, supraclavicular, and axillary nodes normal  Neurologic:    Normal strength,      throughout    ECOG = 2  2 - Symptomatic, <50% in bed during the day (Ambulatory and capable of all self care but unable to carry out any work activities. Up and about more than 50% of waking hours)   LABORATORY DATA:  Lab Results  Component Value Date   WBC 8.1 11/01/2013   HGB 13.0 11/01/2013   HCT 37.6* 11/01/2013   MCV 90.2 11/01/2013   PLT 371 11/01/2013   Lab Results  Component Value Date   NA 134* 11/01/2013   K 4.1 11/01/2013   CL 101 11/01/2013   CO2 26 11/01/2013   Lab Results  Component Value Date   ALT 332* 11/01/2013   AST 69* 11/01/2013   ALKPHOS 89 11/01/2013   BILITOT 0.7 11/01/2013     RADIOGRAPHY: Dg Chest 2 View  11/16/2013   CLINICAL DATA:  Shortness of breath. History of pericardial effusion.  EXAM: CHEST  2 VIEW  COMPARISON:  10/31/2013  FINDINGS: Borderline fullness of the hila. Cardiac and mediastinal margins appear normal. Emphysema noted. No pleural  effusion.  A nodular density projecting between the left 5th and 6th posterior ribs is shown on the CT scan of 10/24/2013 to be a small area of bony bridging.  IMPRESSION: 1. Emphysema, likely with secondary pulmonary hypertension given the hilar prominence. Heart size within normal limits. Prior pleural effusions have cleared.   Electronically Signed   By: Herbie Baltimore M.D.   On: 11/16/2013 14:58   Dg Chest 2 View  10/29/2013   CLINICAL DATA:  Cough, history of lung cancer  EXAM: CHEST  2 VIEW  COMPARISON:  10/27/2013  FINDINGS: Mild cardiac enlargement is stable. The vascular pattern is normal. There is hazy attenuation over the lower 3rd of the left lungs. There is hazy attenuation over the right lung base. These  findings do not appear to be significantly different when compared to the prior study. The lateral view confirms the presence of small right and moderate left pleural effusion with underlying consolidation likely representing passive atelectasis.  IMPRESSION: Bilateral pleural effusions.   Electronically Signed   By: Esperanza Heir M.D.   On: 10/29/2013 09:21   Ct Head W Wo Contrast  10/29/2013   CLINICAL DATA:  57 year old male with newly diagnosed lung cancer. Dizziness. Initial encounter. Staging.  EXAM: CT HEAD WITHOUT AND WITH CONTRAST  TECHNIQUE: Contiguous axial images were obtained from the base of the skull through the vertex without and with intravenous contrast  CONTRAST:  OMNIPAQUE IOHEXOL 300 MG/ML  SOLN  COMPARISON:  Non contrast head CT 06/13/2007 and earlier.  FINDINGS: Mild paranasal sinus mucosal thickening. Mastoids and tympanic cavities are clear.  Stable visualized osseous structures.  Visualized orbits and scalp soft tissues are within normal limits.  Cerebral volume loss since 2008 with associated mild ventricular enlargement. No ventriculomegaly. No midline shift, mass effect, or evidence of intracranial mass lesion. No acute intracranial hemorrhage identified.  No residual extra-axial collection identified. There a small area of cortical encephalomalacia at the right vertex noted on series 4, image 25. No abnormal enhancement identified. Grossly normal enhancement of the major intracranial vascular structures.  IMPRESSION: 1. No acute or metastatic intracranial abnormality identified.  2. 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   Electronically Signed   By: Augusto Gamble M.D.   On: 10/29/2013 11:29   Nm Pet Image Initial (pi) Skull Base To Thigh  11/17/2013   CLINICAL DATA:  Initial treatment strategy for left upper lobe pulmonary nodule. Pericardial resection 10/31/2013 demonstrated inflammation and granulation tissue, but no evidence of malignancy. Negative left upper lobe bronchial brushings. Malignant pericardial effusion consistent with adenocarcinoma.  EXAM: NUCLEAR MEDICINE PET SKULL BASE TO THIGH  FASTING BLOOD GLUCOSE:  Value: 87mg /dl  TECHNIQUE: 16.1 mCi W-96 FDG was injected intravenously. CT data was obtained and used for attenuation correction and anatomic localization only. (This was not acquired as a diagnostic CT examination.) Additional exam technical data entered on technologist worksheet.  COMPARISON:  CTs of the chest, abdomen and pelvis 10/24/2013.  FINDINGS: NECK  No hypermetabolic lymph nodes in the neck.  CHEST  The left upper lobe suprahilar nodule measuring 1.6 cm on image 83 is hypermetabolic with an SUV max of 9.1. Medial to this nodule is a 6 mm nodule on image 78, not showing any increased metabolic activity. There are multiple hypermetabolic small mediastinal and hilar lymph nodes bilaterally. The greatest activity is in the subcarinal station with an SUV max of 6.8. Additional nodes are present in the AP window, prevascular space and both hila.  ABDOMEN/PELVIS  There is no abnormal metabolic activity within the liver, adrenal glands, pancreas or spleen. There is linear subxiphoid activity attributed  to the prior pericardial resection. Within the soft tissues of the right back, there is focally increased metabolic activity. This lies just posterior to collecting system activity in the right kidney, but cannot be explained on the basis of motion. This has an SUV max of 6.7 and is suspicious for a soft tissue metastasis. No focal lesion is apparent on the CT images. There is no hypermetabolic nodal activity within the abdomen or pelvis.  SKELETON  No distinct osseous metastases are identified. There is mildly increased activity at the right 11th costovertebral junction which may be inflammatory.  IMPRESSION: 1. Left upper  lobe suprahilar pulmonary nodule is hypermetabolic consistent with malignancy. This may reflect primary bronchogenic carcinoma. 2. There are several mildly enlarged hypermetabolic mediastinal and hilar lymph nodes bilaterally suspicious for nodal metastases. 3. Hypermetabolic activity in the right back, posterior to the right kidney, suspicious for a soft tissue metastasis.   Electronically Signed   By: Roxy Horseman M.D.   On: 11/17/2013 16:47   Dg Chest Port 1 View  10/31/2013   CLINICAL DATA:  Status post pericardial surgery.  EXAM: PORTABLE CHEST - 1 VIEW  COMPARISON:  October 30, 2013.  FINDINGS: Stable cardiomediastinal silhouette. Right lung is clear. No pneumothorax is noted. Stable left basilar opacity is noted consistent with pleural effusion and possible associated atelectasis.  IMPRESSION: Stable left basilar opacity consistent with effusion and possible associated atelectasis.   Electronically Signed   By: Roque Lias M.D.   On: 10/31/2013 09:39   Dg Chest Portable 1 View  10/30/2013   CLINICAL DATA:  Postop.  EXAM: PORTABLE CHEST - 1 VIEW  COMPARISON:  10/29/2013  FINDINGS: Mild hyperinflation of the lungs. Heart is upper limits normal in size. Bilateral lower lobe airspace opacities, left greater than right. Left pleural effusion. Suspect small right effusion. No  significant change since prior study.  IMPRESSION: Bilateral effusions and lower lobe opacities, unchanged.  Mild hyperinflation.   Electronically Signed   By: Charlett Nose M.D.   On: 10/30/2013 18:43   Dg Chest Port 1 View  10/27/2013   CLINICAL DATA:  57 year old male. Acute respiratory failure. Initial encounter. Status post treatment of acute pericardial effusion. Lung consolidation.  EXAM: PORTABLE CHEST - 1 VIEW  COMPARISON:  10/26/2013 and earlier.  FINDINGS: Portable AP semi upright view at 0412 hrs. Increased bilateral veiling pulmonary opacity. Continued dense retrocardiac opacity. Stable cardiac size and mediastinal contours.  The patient is extubated. Pericardial catheter no longer visible. Enteric tube has been removed.  No pneumothorax.  IMPRESSION: 1. Extubated.  Enteric tube and pericardial catheter removed.  2. Increased bilateral veiling pleural effusions and associated bilateral lower lobe predominant atelectasis.   Electronically Signed   By: Augusto Gamble M.D.   On: 10/27/2013 07:14   Dg Chest Port 1 View  10/26/2013   CLINICAL DATA:  Respiratory difficulty  EXAM: PORTABLE CHEST - 1 VIEW  COMPARISON:  10/25/2013  FINDINGS: Tubular structures stable. Left basilar consolidation stable. Hyperaeration. No pneumothorax.  IMPRESSION: Stable.  Left basilar consolidation   Electronically Signed   By: Maryclare Bean M.D.   On: 10/26/2013 07:46       IMPRESSION: Stage IV non-small cell lung cancer. The patient would be a good candidate for a short course of palliative radiation therapy directed at the soft tissue lesion along the right flank area. I anticipate between 5 and 10 radiation treatments. We will try to get help with transportation issues for the patient.  PLAN: Simulation and planning members 26 with treatments to begin December 18.  I spent 60 minutes minutes face to face with the patient and more than 50% of that time was spent in counseling and/or coordination of care.     ------------------------------------------------  -----------------------------------  Billie Lade, PhD, MD

## 2013-11-24 NOTE — Progress Notes (Signed)
Please see the Nurse Progress Note in the MD Initial Consult Encounter for this patient. 

## 2013-11-24 NOTE — Telephone Encounter (Signed)
Fax from [facility]: FISH study on Lung Tissue [phone] Alk gene rearrangement- Not detected. Results to Dr. Truett Perna for review.

## 2013-11-25 ENCOUNTER — Telehealth: Payer: Self-pay | Admitting: *Deleted

## 2013-11-25 ENCOUNTER — Encounter (HOSPITAL_COMMUNITY): Payer: Self-pay

## 2013-11-25 ENCOUNTER — Ambulatory Visit
Admission: RE | Admit: 2013-11-25 | Discharge: 2013-11-25 | Disposition: A | Discharge status: Home / Self Care | Payer: Medicaid Other | Source: Ambulatory Visit | Attending: Radiation Oncology | Admitting: Radiation Oncology

## 2013-11-25 VITALS — BP 128/103 | HR 86 | Temp 98.4°F | Ht 69.0 in | Wt 132.4 lb

## 2013-11-25 DIAGNOSIS — R0789 Other chest pain: Secondary | ICD-10-CM | POA: Insufficient documentation

## 2013-11-25 DIAGNOSIS — Z510 Encounter for antineoplastic radiation therapy: Secondary | ICD-10-CM | POA: Insufficient documentation

## 2013-11-25 DIAGNOSIS — R222 Localized swelling, mass and lump, trunk: Secondary | ICD-10-CM | POA: Insufficient documentation

## 2013-11-25 DIAGNOSIS — I313 Pericardial effusion (noninflammatory): Secondary | ICD-10-CM

## 2013-11-25 DIAGNOSIS — C3490 Malignant neoplasm of unspecified part of unspecified bronchus or lung: Secondary | ICD-10-CM | POA: Insufficient documentation

## 2013-11-25 DIAGNOSIS — R918 Other nonspecific abnormal finding of lung field: Secondary | ICD-10-CM

## 2013-11-25 MED ORDER — SODIUM CHLORIDE 0.9 % IJ SOLN
10.0000 mL | Freq: Once | INTRAMUSCULAR | Status: AC
  Administered 2013-11-25: 10 mL via INTRAVENOUS

## 2013-11-25 NOTE — Telephone Encounter (Signed)
Received fax from [facility] on Lung Tissue [phone]: Alteration not detected. Results to Dr. Kalman Drape desk for review.

## 2013-11-25 NOTE — Progress Notes (Signed)
  Radiation Oncology         (336) [phone] ________________________________  Name: Philip Andrews MRN: [id number]  Date: 11/25/2013  DOB: Sep 04, 1956  SIMULATION AND TREATMENT PLANNING NOTE  DIAGNOSIS:  Stage IV non-small cell lung cancer with painful right posterior lower chest wall soft tissue mass   NARRATIVE:  The patient was brought to the CT Simulation planning suite.  Identity was confirmed.  All relevant records and images related to the planned course of therapy were reviewed.  The patient freely provided informed written consent to proceed with treatment after reviewing the details related to the planned course of therapy. The consent form was witnessed and verified by the simulation staff.  Then, the patient was set-up in a stable reproducible  supine position for radiation therapy.  CT images were obtained.  Surface markings were placed.  The CT images were loaded into the planning software.  Then the target and avoidance structures were contoured.  Treatment planning then occurred.  The radiation prescription was entered and confirmed.  Then, I designed and supervised the construction of a total of 3 medically necessary complex treatment devices.  I have requested : 3D Simulation  I have requested a DVH of the following structures: GTV. Kidneys, spinal cord.  I have ordered:dose calc.  PLAN:  The patient will receive 30 Gy in 10 fractions.  ________________________________  -----------------------------------  Billie Lade, PhD, MD

## 2013-11-25 NOTE — Progress Notes (Signed)
Removed #22 guave IV intact from Philip Andrews left forearm.  Pressure and bandaid applied.

## 2013-11-25 NOTE — Progress Notes (Signed)
Jaclyn Prime here for IV start.  #22 gauge IV inserted in left forearm.  Blood return noted.  Flused with 10 cc normal salineSecured with Tegaderm and tape.  Notified CT SIM that patient was ready.

## 2013-11-26 ENCOUNTER — Ambulatory Visit
Admission: RE | Admit: 2013-11-26 | Discharge: 2013-11-26 | Disposition: A | Discharge status: Home / Self Care | Payer: Medicaid Other | Source: Ambulatory Visit | Attending: Radiation Oncology | Admitting: Radiation Oncology

## 2013-11-26 DIAGNOSIS — J9601 Acute respiratory failure with hypoxia: Secondary | ICD-10-CM

## 2013-11-26 NOTE — Progress Notes (Signed)
  Radiation Oncology         (336) [phone] ________________________________  Name: Philip Andrews MRN: [id number]  Date: 11/26/2013  DOB: 1956-01-31  Simulation Verification Note  Status: outpatient  NARRATIVE: The patient was brought to the treatment unit and placed in the planned treatment position. The clinical setup was verified. Then port films were obtained and uploaded to the radiation oncology medical record software.  The treatment beams were carefully compared against the planned radiation fields. The position location and shape of the radiation fields was reviewed. They targeted volume of tissue appears to be appropriately covered by the radiation beams. Organs at risk appear to be excluded as planned.  Based on my personal review, I approved the simulation verification. The patient's treatment will proceed as planned.  -----------------------------------  Billie Lade, PhD, MD

## 2013-11-27 ENCOUNTER — Ambulatory Visit
Admission: RE | Admit: 2013-11-27 | Discharge: 2013-11-27 | Disposition: A | Discharge status: Home / Self Care | Payer: Medicaid Other | Source: Ambulatory Visit | Attending: Radiation Oncology | Admitting: Radiation Oncology

## 2013-11-30 ENCOUNTER — Ambulatory Visit
Admission: RE | Admit: 2013-11-30 | Discharge: 2013-11-30 | Disposition: A | Discharge status: Home / Self Care | Payer: Medicaid Other | Source: Ambulatory Visit | Attending: Radiation Oncology | Admitting: Radiation Oncology

## 2013-12-01 ENCOUNTER — Encounter: Payer: Self-pay | Admitting: *Deleted

## 2013-12-01 ENCOUNTER — Ambulatory Visit
Admission: RE | Admit: 2013-12-01 | Discharge: 2013-12-01 | Disposition: A | Discharge status: Home / Self Care | Payer: Medicaid Other | Source: Ambulatory Visit | Attending: Radiation Oncology | Admitting: Radiation Oncology

## 2013-12-01 VITALS — BP 103/56 | HR 87 | Temp 99.0°F | Wt 138.6 lb

## 2013-12-01 DIAGNOSIS — R918 Other nonspecific abnormal finding of lung field: Secondary | ICD-10-CM

## 2013-12-01 NOTE — Progress Notes (Signed)
[facility]    Radiation Oncology [address] [place]     Maryln Gottron, M.D. [place], [place] [address]               Billie Lade, M.D., Ph.D. Phone: [phone]      Molli Hazard A. Kathrynn Running, M.D. Fax: [phone]      Radene Gunning, M.D., Ph.D.         Lurline Hare, M.D.         Grayland Jack, M.D Weekly Treatment Management Note  Name: Philip Andrews     MRN: [id number]        CSN: [id number] Date: 12/01/2013      DOB: 12-Mar-1956  CC: Vassie Loll, MD         Gwenlyn Perking    Status: Outpatient  Diagnosis: Stage IV non-small cell lung cancer with painful right posterior lower chest wall soft tissue mass  Current Dose: 12 Gy  Current Fraction: 4  Planned Dose: 30 Gy  Narrative: Philip Andrews was seen today for weekly treatment management. The chart was checked and port films  were reviewed. He is tolerating his treatments well at this time. He however has not noticed any improvement in his pain thus far.  Benadryl Current Outpatient Prescriptions  Medication Sig Dispense Refill  . escitalopram (LEXAPRO) 10 MG tablet Take 1 tablet (10 mg total) by mouth daily.  30 tablet  1  . feeding supplement, ENSURE COMPLETE, (ENSURE COMPLETE) LIQD Take 237 mLs by mouth 3 (three) times daily between meals.      . Ipratropium-Albuterol (COMBIVENT) 20-100 MCG/ACT AERS respimat Inhale 1 puff into the lungs every 6 (six) hours as needed for wheezing or shortness of breath.  1 Inhaler  2  . oxyCODONE-acetaminophen (PERCOCET/ROXICET) 5-325 MG per tablet Take 1-2 tablets by mouth every 6 (six) hours as needed for severe pain.  60 tablet  0   No current facility-administered medications for this encounter.   Labs:  Lab Results  Component Value Date   WBC 8.1 11/01/2013   HGB 13.0 11/01/2013   HCT 37.6* 11/01/2013   MCV 90.2 11/01/2013   PLT 371 11/01/2013   Lab Results  Component Value Date   CREATININE 0.74 11/01/2013   BUN 14 11/01/2013   NA 134* 11/01/2013   K 4.1  11/01/2013   CL 101 11/01/2013   CO2 26 11/01/2013   Lab Results  Component Value Date   ALT 332* 11/01/2013   AST 69* 11/01/2013   PHOS 2.0* 10/28/2013   BILITOT 0.7 11/01/2013    Physical Examination:  Filed Vitals:   12/01/13 1413  BP: 103/56  Pulse: 87  Temp: 99 F (37.2 C)    Wt Readings from Last 3 Encounters:  12/01/13 138 lb 9.6 oz (62.869 kg)  11/25/13 132 lb 6.4 oz (60.056 kg)  11/17/13 131 lb 1.6 oz (59.467 kg)     Lungs - Normal respiratory effort, chest expands symmetrically. Lungs are clear to auscultation, no crackles or wheezes.  Heart has regular rhythm and rate  Abdomen is soft and non tender with normal bowel sounds  Assessment:  Patient tolerating treatments well  Plan: Continue treatment per original radiation prescription

## 2013-12-01 NOTE — Progress Notes (Signed)
Patient here for weekly assessment of radiation to right flank.Completed 4 of 10 treatments.Mild flank/spine pain mainly at bedtime.

## 2013-12-01 NOTE — Progress Notes (Signed)
[facility] Clinical Social Work  Clinical Social Work received voicemails from patient, Charity fundraiser, and financial advocate, regarding patient obtaining bus passes to get to treatment.  Patient received 31-day disability pass from financial advocate and reported confusion/difficulty obtaining ID card needed to use bus pass.  CSW contacted patient by phone and helped patient clarify appointment times and office hours at [facility] bus depot.  CSW and patient made plan for patient to go to bus depot today (from 10-2) and then ride to bus to [facility] for his 4pm radiation treatment.  CSW also reviewed patient's schedule for the remaining of the week.  CSW encouraged patient to request assistance reading appointment calendar from [facility] staff if he has confusion.  Patient verbalized understanding and briefly mentioned concerns regarding medical bills.  CSW recommended patient bring in bills and discuss with financial counseling and/or social work.  Kathrin Penner, MSW, LCSW Clinical Social Worker [facility] [phone]

## 2013-12-02 ENCOUNTER — Ambulatory Visit: Payer: Medicaid Other

## 2013-12-02 ENCOUNTER — Ambulatory Visit: Payer: Self-pay | Admitting: Nurse Practitioner

## 2013-12-04 ENCOUNTER — Ambulatory Visit
Admission: RE | Admit: 2013-12-04 | Discharge: 2013-12-04 | Disposition: A | Discharge status: Home / Self Care | Payer: Medicaid Other | Source: Ambulatory Visit | Attending: Radiation Oncology | Admitting: Radiation Oncology

## 2013-12-07 ENCOUNTER — Ambulatory Visit
Admission: RE | Admit: 2013-12-07 | Discharge: 2013-12-07 | Disposition: A | Discharge status: Home / Self Care | Payer: Medicaid Other | Source: Ambulatory Visit | Attending: Radiation Oncology | Admitting: Radiation Oncology

## 2013-12-07 DIAGNOSIS — R918 Other nonspecific abnormal finding of lung field: Secondary | ICD-10-CM

## 2013-12-07 LAB — AFB CULTURE WITH SMEAR (NOT AT ARMC)

## 2013-12-07 MED ORDER — RADIAPLEXRX EX GEL
Freq: Once | CUTANEOUS | Status: AC
  Administered 2013-12-07: 1 via TOPICAL

## 2013-12-07 NOTE — Progress Notes (Signed)
Reviewed clinic routine and side effects of treatment to include fatigue and pain management.Given Radiation Therapy and You Booklet.Patient has completed 5 of 10 treatments.

## 2013-12-08 ENCOUNTER — Ambulatory Visit: Admission: RE | Admit: 2013-12-08 | Payer: Medicaid Other | Source: Ambulatory Visit | Admitting: Radiation Oncology

## 2013-12-08 ENCOUNTER — Ambulatory Visit: Payer: Medicaid Other

## 2013-12-09 ENCOUNTER — Ambulatory Visit
Admission: RE | Admit: 2013-12-09 | Discharge: 2013-12-09 | Disposition: A | Discharge status: Home / Self Care | Payer: Medicaid Other | Source: Ambulatory Visit | Attending: Radiation Oncology | Admitting: Radiation Oncology

## 2013-12-11 ENCOUNTER — Ambulatory Visit
Admission: RE | Admit: 2013-12-11 | Discharge: 2013-12-11 | Disposition: A | Discharge status: Home / Self Care | Payer: Medicaid Other | Source: Ambulatory Visit | Attending: Radiation Oncology | Admitting: Radiation Oncology

## 2013-12-11 ENCOUNTER — Ambulatory Visit: Payer: Medicaid Other

## 2013-12-14 ENCOUNTER — Ambulatory Visit
Admission: RE | Admit: 2013-12-14 | Discharge: 2013-12-14 | Disposition: A | Discharge status: Home / Self Care | Payer: Medicaid Other | Source: Ambulatory Visit | Attending: Radiation Oncology | Admitting: Radiation Oncology

## 2013-12-15 ENCOUNTER — Ambulatory Visit: Payer: Medicaid Other

## 2013-12-15 ENCOUNTER — Ambulatory Visit
Admission: RE | Admit: 2013-12-15 | Discharge: 2013-12-15 | Disposition: A | Discharge status: Home / Self Care | Payer: Medicaid Other | Source: Ambulatory Visit | Attending: Radiation Oncology | Admitting: Radiation Oncology

## 2013-12-15 VITALS — BP 100/51 | HR 96 | Temp 98.6°F | Wt 136.3 lb

## 2013-12-15 DIAGNOSIS — R918 Other nonspecific abnormal finding of lung field: Secondary | ICD-10-CM

## 2013-12-15 MED ORDER — OXYCODONE-ACETAMINOPHEN 5-325 MG PO TABS: 1.0000 | ORAL_TABLET | Freq: Four times a day (QID) | ORAL | Status: DC | PRN

## 2013-12-15 NOTE — Progress Notes (Signed)
[place]     Rexene Edison, M.D. [place], [place] [address]               Blair Promise, M.D., Ph.D. Phone: [phone]      Rodman Key A. Tammi Klippel, M.D. Fax: [phone]      Jodelle Gross, M.D., Ph.D.         Thea Silversmith, M.D.         Wyvonnia Lora, M.D Weekly Treatment Management Note  Name: Philip Andrews     MRN: [id number]        CSN: [id number] Date: 12/15/2013      DOB: September 08, 1956  CC: Barton Dubois, MD         Dyann Kief    Status: Outpatient  Diagnosis: The primary encounter diagnosis was Hilar mass. A diagnosis of Lung cancer, left was also pertinent to this visit.  Current Dose: 30 Gy  Current Fraction: 10  Planned Dose: 30 Gy  Narrative: Philip Andrews was seen today for weekly treatment management. The chart was checked and port films  were reviewed. He is happy to complete his radiation therapy. Thus far his pain is not improved. He denies any itching in the radiation portals.  Benadryl Current Outpatient Prescriptions  Medication Sig Dispense Refill  . escitalopram (LEXAPRO) 10 MG tablet Take 1 tablet (10 mg total) by mouth daily.  30 tablet  1  . Ipratropium-Albuterol (COMBIVENT) 20-100 MCG/ACT AERS respimat Inhale 1 puff into the lungs every 6 (six) hours as needed for wheezing or shortness of breath.  1 Inhaler  2  . feeding supplement, ENSURE COMPLETE, (ENSURE COMPLETE) LIQD Take 237 mLs by mouth 3 (three) times daily between meals.      Marland Kitchen oxyCODONE-acetaminophen (PERCOCET/ROXICET) 5-325 MG per tablet Take 1-2 tablets by mouth every 6 (six) hours as needed for severe pain.  60 tablet  0   No current facility-administered medications for this encounter.   Labs:   Physical Examination:  Filed Vitals:   12/15/13 1659  BP: 100/51  Pulse: 96  Temp: 98.6 F (37 C)    Wt Readings from Last 3 Encounters:  12/15/13 136 lb 4.8 oz (61.825 kg)  12/01/13 138 lb 9.6 oz (62.869 kg)  11/25/13 132 lb 6.4  oz (60.056 kg)    The skin in the treatment area shows no appreciable radiation reaction Lungs - Normal respiratory effort, chest expands symmetrically. Lungs are clear to auscultation, no crackles or wheezes.  Heart has regular rhythm and rate  Abdomen is soft and non tender with normal bowel sounds  Assessment:  Patient tolerated treatments well  Plan: Routine followup in one month.  I did refill the patient's oxycodone today

## 2013-12-15 NOTE — Progress Notes (Signed)
Patient completes 10 of 10 radiation treatments to right flank.Continued pain of right flank.Takes ibuprofen for pain states Aunt knocked percocet over into the sink.One month follow up scheduled.

## 2013-12-27 ENCOUNTER — Encounter: Payer: Self-pay | Admitting: Radiation Oncology

## 2013-12-27 NOTE — Progress Notes (Signed)
  Radiation Oncology         [phone] ________________________________  Name: Philip Andrews MRN: [id number]  Date: 12/27/2013  DOB: Jun 04, 1956  End of Treatment Note  Diagnosis:      Stage IV non-small cell lung cancer with painful right posterior lower chest wall soft tissue mass   Indication for treatment:  Pain       Radiation treatment dates:   December 18 through January 6  Site/dose:   Right lower posterior chest wall mass, 30 gray in 10 fractions   Beams/energy:   Oblique fields, 15 MV photon  Narrative: The patient tolerated radiation treatment relatively well.   Towards the end of his therapy he did experience some pain improvement.   Plan: The patient has completed radiation treatment. The patient will return to radiation oncology clinic for routine followup in one month. I advised them to call or return sooner if they have any questions or concerns related to their recovery or treatment.  -----------------------------------  Blair Promise, PhD, MD

## 2014-01-06 ENCOUNTER — Other Ambulatory Visit: Payer: Self-pay | Admitting: *Deleted

## 2014-01-07 ENCOUNTER — Telehealth: Payer: Self-pay | Admitting: Oncology

## 2014-01-07 NOTE — Telephone Encounter (Signed)
s.w. pt regardign to Feb appt...mailed pt appt sched avs and letter

## 2014-01-07 NOTE — Telephone Encounter (Signed)
s.w. pt and advised on Feb aappt....mailed pt appt sched, avs with letter

## 2014-01-19 ENCOUNTER — Encounter: Payer: Self-pay | Admitting: Oncology

## 2014-01-21 ENCOUNTER — Encounter: Payer: Self-pay | Admitting: Radiation Oncology

## 2014-01-21 ENCOUNTER — Ambulatory Visit
Admission: RE | Admit: 2014-01-21 | Discharge: 2014-01-21 | Disposition: A | Discharge status: Home / Self Care | Payer: Medicaid Other | Source: Ambulatory Visit | Attending: Radiation Oncology | Admitting: Radiation Oncology

## 2014-01-21 VITALS — BP 127/78 | HR 94 | Temp 99.0°F | Ht 69.0 in | Wt 140.3 lb

## 2014-01-21 DIAGNOSIS — R918 Other nonspecific abnormal finding of lung field: Secondary | ICD-10-CM

## 2014-01-21 MED ORDER — OXYCODONE-ACETAMINOPHEN 5-325 MG PO TABS: 1.0000 | ORAL_TABLET | Freq: Four times a day (QID) | ORAL | Status: DC | PRN
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
## 2014-01-22 ENCOUNTER — Encounter: Payer: Self-pay | Admitting: *Deleted

## 2014-01-22 NOTE — Progress Notes (Signed)
[place] Work  Clinical Social Work was referred by Marine scientist and Development worker, community to assist with bus pass.  Clinical Social Worker met with patient at [facility] and provided Pt with one ride bus pass. Pt has green card already set up with [employer]. Pt denied other concerns and left [facility]. CSW later was made aware of other concerns and attempted to phone Pt offer support and assess for needs.  Pt was not at home and then CSW tried to call back, but phone was busy. CSW will try to call next week and reassess needs.    Loren Racer, LCSW Clinical Social Worker [facility] [place] [place] Wednesday, Thursday and Friday Phone: [phone] Fax: [phone]

## 2014-02-03 ENCOUNTER — Telehealth: Payer: Self-pay | Admitting: Oncology

## 2014-02-03 ENCOUNTER — Ambulatory Visit (HOSPITAL_BASED_OUTPATIENT_CLINIC_OR_DEPARTMENT_OTHER): Payer: Medicaid Other | Admitting: Nurse Practitioner

## 2014-02-03 VITALS — BP 136/82 | HR 108 | Temp 97.4°F | Resp 18 | Ht 69.0 in | Wt 147.3 lb

## 2014-02-03 DIAGNOSIS — C3410 Malignant neoplasm of upper lobe, unspecified bronchus or lung: Secondary | ICD-10-CM

## 2014-02-03 DIAGNOSIS — R109 Unspecified abdominal pain: Secondary | ICD-10-CM

## 2014-02-03 DIAGNOSIS — R222 Localized swelling, mass and lump, trunk: Secondary | ICD-10-CM

## 2014-02-03 DIAGNOSIS — M545 Low back pain, unspecified: Secondary | ICD-10-CM

## 2014-02-03 DIAGNOSIS — D649 Anemia, unspecified: Secondary | ICD-10-CM

## 2014-02-03 DIAGNOSIS — C50919 Malignant neoplasm of unspecified site of unspecified female breast: Secondary | ICD-10-CM

## 2014-02-03 DIAGNOSIS — R079 Chest pain, unspecified: Secondary | ICD-10-CM

## 2014-02-03 DIAGNOSIS — C799 Secondary malignant neoplasm of unspecified site: Secondary | ICD-10-CM

## 2014-02-03 DIAGNOSIS — C779 Secondary and unspecified malignant neoplasm of lymph node, unspecified: Secondary | ICD-10-CM

## 2014-02-03 MED ORDER — ESCITALOPRAM OXALATE 10 MG PO TABS: 10.0000 mg | ORAL_TABLET | Freq: Every day | ORAL | Status: DC

## 2014-02-03 MED ORDER — OXYCODONE-ACETAMINOPHEN 5-325 MG PO TABS: 1.0000 | ORAL_TABLET | Freq: Four times a day (QID) | ORAL | Status: DC | PRN

## 2014-02-03 NOTE — Progress Notes (Addendum)
OFFICE PROGRESS NOTE  Interval history:  [name] is a 58 year old man with metastatic adenocarcinoma. He was last seen in our office on 11/17/2013. He was referred to radiation oncology for palliative radiation to a soft tissue metastasis at the right flank region. He completed the course of radiation on 12/15/2013. He did not return for subsequent followup in our office.  The right flank pain is better. Over the past 6-7 weeks he has been experiencing intermittent pain at the left chest. The pain worsens with coughing. He is taking Percocet as needed. He denies shortness of breath. No fever. He denies nausea/vomiting. No constipation or diarrhea. Appetite is better. He is gaining weight.  Objective: Filed Vitals:   02/03/14 0919  BP: 136/82  Pulse: 108  Temp: 97.4 F (36.3 C)  Resp: 18   No thrush. No palpable cervical, supraclavicular or axillary lymph nodes. Lungs are clear. Regular cardiac rhythm. Abdomen soft and nontender. No hepatomegaly. No leg edema. Nontender over the right flank region. Asymmetric fullness at the right flank. Radiation skin change present. Tender over the left upper anterior chest wall. No mass.   Lab Results: Lab Results  Component Value Date   WBC 8.1 11/01/2013   HGB 13.0 11/01/2013   HCT 37.6* 11/01/2013   MCV 90.2 11/01/2013   PLT 371 11/01/2013   NEUTROABS 11.7* 10/24/2013    Chemistry:    Chemistry      Component Value Date/Time   NA 134* 11/01/2013 0513   K 4.1 11/01/2013 0513   CL 101 11/01/2013 0513   CO2 26 11/01/2013 0513   BUN 14 11/01/2013 0513   CREATININE 0.74 11/01/2013 0513      Component Value Date/Time   CALCIUM 8.5 11/01/2013 0513   ALKPHOS 89 11/01/2013 0513   AST 69* 11/01/2013 0513   ALT 332* 11/01/2013 0513   BILITOT 0.7 11/01/2013 0513       Studies/Results: No results found.  Medications: I have reviewed the patient's current medications.  Assessment/Plan: 1. Metastatic adenocarcinoma involving a  pericardial effusion, TTF-1 and Napsin-A negative.  Status post pericardial window procedure and bronchoscopy on 10/31/2013. No tumor found at bronchoscopy and the pericardial tissue was negative for malignancy.  Staging PET scan 11/17/2013 with a hypermetabolic 1.6 cm left upper lobe suprahilar nodule with SUV max 9.1; medial to that nodule is a 6 mm nodule not showing any increased metabolic activity; multiple hypermetabolic small mediastinal and hilar lymph nodes bilaterally; additional nodes present in the AP window, prevascular space and both hila; focally increased metabolic activity within the soft tissues of the right back with SUV max 6.7. Status post palliative radiation to a soft tissue metastasis at the right back 11/26/2013 through 12/15/2013. 2. Respiratory failure/cardiac tamponade secondary to #1. Resolved. 3. History of polysubstance abuse. 4. Acute renal failure. Resolved. 5. Mild normocytic anemia. 6. Left hilar mass on the noncontrast chest CT 10/24/2013. 7. Right low back/flank pain. Likely secondary to a soft tissue metastasis at the right back. He completed palliative radiation 11/26/2013 through 12/15/2013. 8. 6-7 week history of intermittent left chest pain.   Dispositon-he appears stable. The back pain is better following radiation. He now has pain at the left chest. He is requiring pain medication. A new prescription for Percocet was provided to him at today's visit. Dr. Benay Spice recommends restaging CT scans of the chest and abdomen. He will return for a followup visit on 02/23/2014 to review the results.  He has taken Lexapro previously. He requested a new  prescription today.  Patient seen with Dr. Benay Spice.   Ned Card ANP/GNP-BC   This was a shared visit with Ned Card.  Mr. Shipes completed palliative radiation to the right lower back mass with improvement in pain. He now reports pain and tenderness at the left anterior chest wall. He will be referred for a  restaging CT evaluation and return for an office visit in 2-3 weeks.  Julieanne Manson, M.D.

## 2014-02-03 NOTE — Telephone Encounter (Signed)
gv and printed aptp sched and avs forpt for March....gv pt barium

## 2014-02-11 ENCOUNTER — Ambulatory Visit (HOSPITAL_COMMUNITY): Admission: RE | Admit: 2014-02-11 | Payer: Medicaid Other | Source: Ambulatory Visit

## 2014-02-11 ENCOUNTER — Telehealth: Payer: Self-pay | Admitting: *Deleted

## 2014-02-11 ENCOUNTER — Other Ambulatory Visit: Payer: Self-pay

## 2014-02-11 NOTE — Telephone Encounter (Signed)
Called pt to follow up after missed CT scan appt. He reports he "got stuck in [place]." Gave pt the number to call radiology to reschedule scan. Confirmed next office visit appt.

## 2014-02-18 ENCOUNTER — Encounter (HOSPITAL_COMMUNITY): Payer: Self-pay

## 2014-02-18 ENCOUNTER — Ambulatory Visit (HOSPITAL_COMMUNITY)
Admission: RE | Admit: 2014-02-18 | Discharge: 2014-02-18 | Disposition: A | Discharge status: Home / Self Care | Payer: Medicaid Other | Source: Ambulatory Visit | Attending: Nurse Practitioner | Admitting: Nurse Practitioner

## 2014-02-18 DIAGNOSIS — C801 Malignant (primary) neoplasm, unspecified: Secondary | ICD-10-CM | POA: Insufficient documentation

## 2014-02-18 DIAGNOSIS — K7689 Other specified diseases of liver: Secondary | ICD-10-CM | POA: Insufficient documentation

## 2014-02-18 DIAGNOSIS — C3490 Malignant neoplasm of unspecified part of unspecified bronchus or lung: Secondary | ICD-10-CM | POA: Insufficient documentation

## 2014-02-18 DIAGNOSIS — C799 Secondary malignant neoplasm of unspecified site: Secondary | ICD-10-CM

## 2014-02-18 MED ORDER — IOHEXOL 300 MG/ML  SOLN
100.0000 mL | Freq: Once | INTRAMUSCULAR | Status: AC | PRN
  Administered 2014-02-18: 100 mL via INTRAVENOUS

## 2014-02-23 ENCOUNTER — Ambulatory Visit (HOSPITAL_BASED_OUTPATIENT_CLINIC_OR_DEPARTMENT_OTHER): Payer: Medicaid Other | Admitting: Nurse Practitioner

## 2014-02-23 ENCOUNTER — Other Ambulatory Visit: Payer: Self-pay | Admitting: Nurse Practitioner

## 2014-02-23 VITALS — BP 118/69 | HR 84 | Temp 98.0°F | Resp 18 | Ht 69.0 in | Wt 138.4 lb

## 2014-02-23 DIAGNOSIS — R222 Localized swelling, mass and lump, trunk: Secondary | ICD-10-CM

## 2014-02-23 DIAGNOSIS — C799 Secondary malignant neoplasm of unspecified site: Secondary | ICD-10-CM | POA: Insufficient documentation

## 2014-02-23 DIAGNOSIS — C801 Malignant (primary) neoplasm, unspecified: Secondary | ICD-10-CM

## 2014-02-23 DIAGNOSIS — C50919 Malignant neoplasm of unspecified site of unspecified female breast: Secondary | ICD-10-CM

## 2014-02-23 DIAGNOSIS — C779 Secondary and unspecified malignant neoplasm of lymph node, unspecified: Secondary | ICD-10-CM

## 2014-02-23 HISTORY — DX: Secondary malignant neoplasm of unspecified site: C79.9

## 2014-02-23 MED ORDER — DEXAMETHASONE 4 MG PO TABS: ORAL_TABLET | ORAL | Status: DC

## 2014-02-23 MED ORDER — OXYCODONE-ACETAMINOPHEN 5-325 MG PO TABS: 1.0000 | ORAL_TABLET | Freq: Four times a day (QID) | ORAL | Status: DC | PRN

## 2014-02-23 NOTE — Patient Instructions (Signed)
Carboplatin injection What is this medicine? CARBOPLATIN (KAR boe pla tin) is a chemotherapy drug. It targets fast dividing cells, like cancer cells, and causes these cells to die. This medicine is used to treat ovarian cancer and many other cancers. This medicine may be used for other purposes; ask your health care provider or pharmacist if you have questions. COMMON BRAND NAME(S): Paraplatin What should I tell my health care provider before I take this medicine? They need to know if you have any of these conditions: -blood disorders -hearing problems -kidney disease -recent or ongoing radiation therapy -an unusual or allergic reaction to carboplatin, cisplatin, other chemotherapy, other medicines, foods, dyes, or preservatives -pregnant or trying to get pregnant -breast-feeding How should I use this medicine? This drug is usually given as an infusion into a vein. It is administered in a hospital or clinic by a specially trained health care professional. Talk to your pediatrician regarding the use of this medicine in children. Special care may be needed. Overdosage: If you think you have taken too much of this medicine contact a poison control center or emergency room at once. NOTE: This medicine is only for you. Do not share this medicine with others. What if I miss a dose? It is important not to miss a dose. Call your doctor or health care professional if you are unable to keep an appointment. What may interact with this medicine? -medicines for seizures -medicines to increase blood counts like filgrastim, pegfilgrastim, sargramostim -some antibiotics like amikacin, gentamicin, neomycin, streptomycin, tobramycin -vaccines Talk to your doctor or health care professional before taking any of these medicines: -acetaminophen -aspirin -ibuprofen -ketoprofen -naproxen This list may not describe all possible interactions. Give your health care provider a list of all the medicines, herbs,  non-prescription drugs, or dietary supplements you use. Also tell them if you smoke, drink alcohol, or use illegal drugs. Some items may interact with your medicine. What should I watch for while using this medicine? Your condition will be monitored carefully while you are receiving this medicine. You will need important blood work done while you are taking this medicine. This drug may make you feel generally unwell. This is not uncommon, as chemotherapy can affect healthy cells as well as cancer cells. Report any side effects. Continue your course of treatment even though you feel ill unless your doctor tells you to stop. In some cases, you may be given additional medicines to help with side effects. Follow all directions for their use. Call your doctor or health care professional for advice if you get a fever, chills or sore throat, or other symptoms of a cold or flu. Do not treat yourself. This drug decreases your body's ability to fight infections. Try to avoid being around people who are sick. This medicine may increase your risk to bruise or bleed. Call your doctor or health care professional if you notice any unusual bleeding. Be careful brushing and flossing your teeth or using a toothpick because you may get an infection or bleed more easily. If you have any dental work done, tell your dentist you are receiving this medicine. Avoid taking products that contain aspirin, acetaminophen, ibuprofen, naproxen, or ketoprofen unless instructed by your doctor. These medicines may hide a fever. Do not become pregnant while taking this medicine. Women should inform their doctor if they wish to become pregnant or think they might be pregnant. There is a potential for serious side effects to an unborn child. Talk to your health care professional or  pharmacist for more information. Do not breast-feed an infant while taking this medicine. What side effects may I notice from receiving this medicine? Side effects  that you should report to your doctor or health care professional as soon as possible: -allergic reactions like skin rash, itching or hives, swelling of the face, lips, or tongue -signs of infection - fever or chills, cough, sore throat, pain or difficulty passing urine -signs of decreased platelets or bleeding - bruising, pinpoint red spots on the skin, black, tarry stools, nosebleeds -signs of decreased red blood cells - unusually weak or tired, fainting spells, lightheadedness -breathing problems -changes in hearing -changes in vision -chest pain -high blood pressure -low blood counts - This drug may decrease the number of white blood cells, red blood cells and platelets. You may be at increased risk for infections and bleeding. -nausea and vomiting -pain, swelling, redness or irritation at the injection site -pain, tingling, numbness in the hands or feet -problems with balance, talking, walking -trouble passing urine or change in the amount of urine Side effects that usually do not require medical attention (report to your doctor or health care professional if they continue or are bothersome): -hair loss -loss of appetite -metallic taste in the mouth or changes in taste This list may not describe all possible side effects. Call your doctor for medical advice about side effects. You may report side effects to FDA at 1-800-FDA-1088. Where should I keep my medicine? This drug is given in a hospital or clinic and will not be stored at home. NOTE: This sheet is a summary. It may not cover all possible information. If you have questions about this medicine, talk to your doctor, pharmacist, or health care provider.  2014, Elsevier/Gold Standard. (2008-03-02 14:38:05) Paclitaxel injection What is this medicine? PACLITAXEL (PAK li TAX el) is a chemotherapy drug. It targets fast dividing cells, like cancer cells, and causes these cells to die. This medicine is used to treat ovarian cancer,  breast cancer, and other cancers. This medicine may be used for other purposes; ask your health care provider or pharmacist if you have questions. COMMON BRAND NAME(S): Onxol , Taxol What should I tell my health care provider before I take this medicine? They need to know if you have any of these conditions: -blood disorders -irregular heartbeat -infection (especially a virus infection such as chickenpox, cold sores, or herpes) -liver disease -previous or ongoing radiation therapy -an unusual or allergic reaction to paclitaxel, alcohol, polyoxyethylated castor oil, other chemotherapy agents, other medicines, foods, dyes, or preservatives -pregnant or trying to get pregnant -breast-feeding How should I use this medicine? This drug is given as an infusion into a vein. It is administered in a hospital or clinic by a specially trained health care professional. Talk to your pediatrician regarding the use of this medicine in children. Special care may be needed. Overdosage: If you think you have taken too much of this medicine contact a poison control center or emergency room at once. NOTE: This medicine is only for you. Do not share this medicine with others. What if I miss a dose? It is important not to miss your dose. Call your doctor or health care professional if you are unable to keep an appointment. What may interact with this medicine? Do not take this medicine with any of the following medications: -disulfiram -metronidazole This medicine may also interact with the following medications: -cyclosporine -diazepam -ketoconazole -medicines to increase blood counts like filgrastim, pegfilgrastim, sargramostim -other chemotherapy drugs like  cisplatin, doxorubicin, epirubicin, etoposide, teniposide, vincristine -quinidine -testosterone -vaccines -verapamil Talk to your doctor or health care professional before taking any of these  medicines: -acetaminophen -aspirin -ibuprofen -ketoprofen -naproxen This list may not describe all possible interactions. Give your health care provider a list of all the medicines, herbs, non-prescription drugs, or dietary supplements you use. Also tell them if you smoke, drink alcohol, or use illegal drugs. Some items may interact with your medicine. What should I watch for while using this medicine? Your condition will be monitored carefully while you are receiving this medicine. You will need important blood work done while you are taking this medicine. This drug may make you feel generally unwell. This is not uncommon, as chemotherapy can affect healthy cells as well as cancer cells. Report any side effects. Continue your course of treatment even though you feel ill unless your doctor tells you to stop. In some cases, you may be given additional medicines to help with side effects. Follow all directions for their use. Call your doctor or health care professional for advice if you get a fever, chills or sore throat, or other symptoms of a cold or flu. Do not treat yourself. This drug decreases your body's ability to fight infections. Try to avoid being around people who are sick. This medicine may increase your risk to bruise or bleed. Call your doctor or health care professional if you notice any unusual bleeding. Be careful brushing and flossing your teeth or using a toothpick because you may get an infection or bleed more easily. If you have any dental work done, tell your dentist you are receiving this medicine. Avoid taking products that contain aspirin, acetaminophen, ibuprofen, naproxen, or ketoprofen unless instructed by your doctor. These medicines may hide a fever. Do not become pregnant while taking this medicine. Women should inform their doctor if they wish to become pregnant or think they might be pregnant. There is a potential for serious side effects to an unborn child. Talk to  your health care professional or pharmacist for more information. Do not breast-feed an infant while taking this medicine. Men are advised not to father a child while receiving this medicine. What side effects may I notice from receiving this medicine? Side effects that you should report to your doctor or health care professional as soon as possible: -allergic reactions like skin rash, itching or hives, swelling of the face, lips, or tongue -low blood counts - This drug may decrease the number of white blood cells, red blood cells and platelets. You may be at increased risk for infections and bleeding. -signs of infection - fever or chills, cough, sore throat, pain or difficulty passing urine -signs of decreased platelets or bleeding - bruising, pinpoint red spots on the skin, black, tarry stools, nosebleeds -signs of decreased red blood cells - unusually weak or tired, fainting spells, lightheadedness -breathing problems -chest pain -high or low blood pressure -mouth sores -nausea and vomiting -pain, swelling, redness or irritation at the injection site -pain, tingling, numbness in the hands or feet -slow or irregular heartbeat -swelling of the ankle, feet, hands Side effects that usually do not require medical attention (report to your doctor or health care professional if they continue or are bothersome): -bone pain -complete hair loss including hair on your head, underarms, pubic hair, eyebrows, and eyelashes -changes in the color of fingernails -diarrhea -loosening of the fingernails -loss of appetite -muscle or joint pain -red flush to skin -sweating This list may not describe all  possible side effects. Call your doctor for medical advice about side effects. You may report side effects to FDA at 1-800-FDA-1088. Where should I keep my medicine? This drug is given in a hospital or clinic and will not be stored at home. NOTE: This sheet is a summary. It may not cover all possible  information. If you have questions about this medicine, talk to your doctor, pharmacist, or health care provider.  2014, Elsevier/Gold Standard. (2013-01-19 16:41:21)

## 2014-02-23 NOTE — Progress Notes (Addendum)
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 He continues to have intermittent pain at the left chest. The pain occurs with coughing. He takes one Percocet at bedtime. Right flank pain has resolved. He reports a good appetite. No dysphagia. He denies shortness of breath. Toes intermittently feels cold. He denies numbness. Bowels moving regularly. No hematuria or dysuria.   Objective: Filed Vitals:   02/23/14 1009  BP: 118/69  Pulse: 84  Temp: 98 F (36.7 C)  Resp: 18   Oropharynx is without thrush or ulceration. No palpable cervical or supraclavicular lymph nodes. Lungs are clear. Regular cardiac rhythm. Abdomen soft and nontender. No hepatomegaly. No leg edema. Nontender over the left anterior chest wall.   Lab Results: Lab Results  Component Value Date   WBC 8.1 11/01/2013   HGB 13.0 11/01/2013   HCT 37.6* 11/01/2013   MCV 90.2 11/01/2013   PLT 371 11/01/2013   NEUTROABS 11.7* 10/24/2013    Chemistry:    Chemistry      Component Value Date/Time   NA 134* 11/01/2013 0513   K 4.1 11/01/2013 0513   CL 101 11/01/2013 0513   CO2 26 11/01/2013 0513   BUN 14 11/01/2013 0513   CREATININE 0.74 11/01/2013 0513      Component Value Date/Time   CALCIUM 8.5 11/01/2013 0513   ALKPHOS 89 11/01/2013 0513   AST 69* 11/01/2013 0513   ALT 332* 11/01/2013 0513   BILITOT 0.7 11/01/2013 0513       Studies/Results: Ct Chest W Contrast  02/18/2014   CLINICAL DATA:  Metastatic lung adenocarcinoma. Restaging examination.  EXAM: CT CHEST AND ABDOMEN WITH CONTRAST  TECHNIQUE: Multidetector CT imaging of the chest, abdomen and pelvis was performed following the standard protocol during bolus administration of intravenous contrast.  CONTRAST:  162mL OMNIPAQUE IOHEXOL 300 MG/ML  SOLN  COMPARISON:  PET-CT 11/17/2013.  FINDINGS: CT CHEST FINDINGS  Mediastinum: Several areas of nodular thickening are noted overlying  the left heart, suspicious for pericardial metastases. These include a 2.7 x 0.7 cm lesion on image 52 of series 2 overlying the lateral wall the left ventricle, as well as a large irregular-shaped lesion that is located immediately lateral to the left atrial appendage, best demonstrated on image 39 of series 2 where it measures approximately 3.7 x 1.9 cm. There is a cluster of enlarged lymph nodes in the AP window which are difficult to discretely measure, but measure up to 3.4 x 2.9 cm together on image 28 of series 2. Enlarged low right peritracheal lymph nodes measuring 1.2 cm. Prominent bilateral hilar lymph nodes 20 mm in short axis on the left and 14 mm in short axis on the right. Subcarinal lymph nodes measuring up to 12 mm in short axis. Heart size is normal. No significant pericardial fluid or calcification. Esophagus is unremarkable in appearance.  Lungs/Pleura: Previously noted left upper lobe nodule appears larger than the prior examinations, currently measuring 2.7 x 1.8 cm (image 28 of series 4). This has a macrolobulated contour and is contiguous with multiple adjacent bronchi, presumably with some direct bronchial invasion. The bronchi distal to this lesion within the left upper lobe appear filled with fluid, and there is extensive peribronchovascular micronodularity in the apex of the left upper lobe with some associated hyperexpansion, presumably indicative of air trapping. Mild diffuse bronchial wall thickening with moderate centrilobular and paraseptal emphysema, most pronounced in the right upper lobe. Post infectious or  inflammatory scarring in the posterior aspect of the right upper lobe is similar to the prior study. No pleural effusions.  Musculoskeletal: There are no aggressive appearing lytic or blastic lesions noted in the visualized portions of the skeleton.  CT ABDOMEN FINDINGS  Abdomen/Pelvis: Ill-defined area of low attenuation or hypoperfusion in segment 6 of the liver (image 80 of  series 2) measuring approximately 1.3 x 1.2 cm. This is not confidently identified on the delayed images, and cannot be a confidently identified on prior studies. No other hepatic lesions are noted. The appearance of the gallbladder, pancreas, spleen, bilateral adrenal glands and bilateral kidneys is unremarkable. Within the visualized portions of the peritoneal cavity there is no significant volume of ascites, no pneumoperitoneum and no pathologic distention of small bowel. No definite lymphadenopathy in the visualized abdomen. Extensive atherosclerosis in the abdominal vasculature, without evidence of aneurysm.  Musculoskeletal: There are no aggressive appearing lytic or blastic lesions noted in the visualized portions of the skeleton. Previously suspected soft tissue lesion in the paraspinal musculature posterior to the right kidney is not confidently identified on today's CT examination.  IMPRESSION: 1. Today's study demonstrates progression of disease with interval enlargement of the left upper lobe pulmonary nodule which appears invasive within several left upper lobe bronchi, enlargement of pericardial lesions overlying the left heart, and enlargement of bilateral hilar and mediastinal lymph nodes, as discussed above. 2. Ill-defined lesion in segment 6 of the liver measuring 13 x 12 mm is incompletely characterized on today's examination and is technically indeterminate. The possibility of a metastatic lesion is not excluded, and further characterization with MRI of the abdomen with and without IV gadolinium may provide additional diagnostic information if clinically appropriate. 3. Previously suspected soft tissue lesion in the paraspinous musculature posterior to the right kidney is not confidently identified on today's CT examination. 4. Additional incidental findings, as above.   Electronically Signed   By: Vinnie Langton M.D.   On: 02/18/2014 15:51   Ct Abdomen W Contrast  02/18/2014   CLINICAL  DATA:  Metastatic lung adenocarcinoma. Restaging examination.  EXAM: CT CHEST AND ABDOMEN WITH CONTRAST  TECHNIQUE: Multidetector CT imaging of the chest, abdomen and pelvis was performed following the standard protocol during bolus administration of intravenous contrast.  CONTRAST:  162mL OMNIPAQUE IOHEXOL 300 MG/ML  SOLN  COMPARISON:  PET-CT 11/17/2013.  FINDINGS: CT CHEST FINDINGS  Mediastinum: Several areas of nodular thickening are noted overlying the left heart, suspicious for pericardial metastases. These include a 2.7 x 0.7 cm lesion on image 52 of series 2 overlying the lateral wall the left ventricle, as well as a large irregular-shaped lesion that is located immediately lateral to the left atrial appendage, best demonstrated on image 39 of series 2 where it measures approximately 3.7 x 1.9 cm. There is a cluster of enlarged lymph nodes in the AP window which are difficult to discretely measure, but measure up to 3.4 x 2.9 cm together on image 28 of series 2. Enlarged low right peritracheal lymph nodes measuring 1.2 cm. Prominent bilateral hilar lymph nodes 20 mm in short axis on the left and 14 mm in short axis on the right. Subcarinal lymph nodes measuring up to 12 mm in short axis. Heart size is normal. No significant pericardial fluid or calcification. Esophagus is unremarkable in appearance.  Lungs/Pleura: Previously noted left upper lobe nodule appears larger than the prior examinations, currently measuring 2.7 x 1.8 cm (image 28 of series 4). This has a macrolobulated contour and  is contiguous with multiple adjacent bronchi, presumably with some direct bronchial invasion. The bronchi distal to this lesion within the left upper lobe appear filled with fluid, and there is extensive peribronchovascular micronodularity in the apex of the left upper lobe with some associated hyperexpansion, presumably indicative of air trapping. Mild diffuse bronchial wall thickening with moderate centrilobular and  paraseptal emphysema, most pronounced in the right upper lobe. Post infectious or inflammatory scarring in the posterior aspect of the right upper lobe is similar to the prior study. No pleural effusions.  Musculoskeletal: There are no aggressive appearing lytic or blastic lesions noted in the visualized portions of the skeleton.  CT ABDOMEN FINDINGS  Abdomen/Pelvis: Ill-defined area of low attenuation or hypoperfusion in segment 6 of the liver (image 80 of series 2) measuring approximately 1.3 x 1.2 cm. This is not confidently identified on the delayed images, and cannot be a confidently identified on prior studies. No other hepatic lesions are noted. The appearance of the gallbladder, pancreas, spleen, bilateral adrenal glands and bilateral kidneys is unremarkable. Within the visualized portions of the peritoneal cavity there is no significant volume of ascites, no pneumoperitoneum and no pathologic distention of small bowel. No definite lymphadenopathy in the visualized abdomen. Extensive atherosclerosis in the abdominal vasculature, without evidence of aneurysm.  Musculoskeletal: There are no aggressive appearing lytic or blastic lesions noted in the visualized portions of the skeleton. Previously suspected soft tissue lesion in the paraspinal musculature posterior to the right kidney is not confidently identified on today's CT examination.  IMPRESSION: 1. Today's study demonstrates progression of disease with interval enlargement of the left upper lobe pulmonary nodule which appears invasive within several left upper lobe bronchi, enlargement of pericardial lesions overlying the left heart, and enlargement of bilateral hilar and mediastinal lymph nodes, as discussed above. 2. Ill-defined lesion in segment 6 of the liver measuring 13 x 12 mm is incompletely characterized on today's examination and is technically indeterminate. The possibility of a metastatic lesion is not excluded, and further characterization  with MRI of the abdomen with and without IV gadolinium may provide additional diagnostic information if clinically appropriate. 3. Previously suspected soft tissue lesion in the paraspinous musculature posterior to the right kidney is not confidently identified on today's CT examination. 4. Additional incidental findings, as above.   Electronically Signed   By: Vinnie Langton M.D.   On: 02/18/2014 15:51    Medications: I have reviewed the patient's current medications.  Assessment/Plan: 1. Metastatic adenocarcinoma involving a pericardial effusion, TTF-1 and Napsin-A negative.  Status post pericardial window procedure and bronchoscopy on 10/31/2013. No tumor found at bronchoscopy and the pericardial tissue was negative for malignancy.  Staging PET scan 11/17/2013 with a hypermetabolic 1.6 cm left upper lobe suprahilar nodule with SUV max 9.1; medial to that nodule is a 6 mm nodule not showing any increased metabolic activity; multiple hypermetabolic small mediastinal and hilar lymph nodes bilaterally; additional nodes present in the AP window, prevascular space and both hila; focally increased metabolic activity within the soft tissues of the right back with SUV max 6.7.  Status post palliative radiation to a soft tissue metastasis at the right back 11/26/2013 through 12/15/2013. Restaging CT chest and abdomen 02/18/2014. Interval enlargement of the left upper lobe pulmonary nodule; enlargement of pericardial lesions overlying the heart; enlargement of bilateral hilar and mediastinal lymph nodes; ill-defined lesion in the liver not identified on prior studies. Previously suspected soft tissue lesion in the paraspinous muscular posterior to the right kidney not  confidently identified on current study. 2. Respiratory failure/cardiac tamponade secondary to #1. Resolved. 3. History of polysubstance abuse. 4. Acute renal failure. Resolved. 5. Mild normocytic anemia. 6. Left hilar mass on the noncontrast  chest CT 10/24/2013. 7. Right low back/flank pain. Likely secondary to a soft tissue metastasis at the right back. He completed palliative radiation 11/26/2013 through 12/15/2013. The pain has resolved. 8. 6-7 week history of intermittent left chest pain. Question secondary to pericardial metastases.  Dispositon-he appears stable. The recent restaging CT scans show evidence of progression. Dr. Benay Spice reviewed the CT results with Philip Andrews. Philip Andrews did not wish to review the images. He understands that no therapy will be curative.   Dr. Benay Spice recommends a trial of chemotherapy with carboplatin/Taxol on a 3 week schedule. We reviewed potential toxicities including myelosuppression, hair loss, nausea, mouth sores. We reviewed the potential for an allergic reaction, possibly severe. He will be premedicated with dexamethasone 10 mg to be taken at bedtime the night prior to cycle 1 with a repeat dose at 6 AM the day of cycle 1. We discussed the potential for neuropathy with Taxol. He is agreeable to proceed.  He reports an allergy to Benadryl. He describes the allergy as similar to "poison ivy". Benadryl will be deleted from the premedication regimen.  He will return to begin cycle 1 Taxol/carbo on 03/04/2014. We will see him in followup on 03/15/2014. He will contact the office in the interim with any problems. He was given a new prescription for Percocet for the left chest pain.   Patient seen with Dr. Benay Spice. 25 minutes were spent face-to-face at today's visit with the majority of that time involved in counseling/coordination of care.   Ned Card ANP/GNP-BC  This was a shared visit with Ned Card. Philip Andrews has metastatic adenocarcinoma, most likely of lung primary. We reviewed the CT findings with him and discussed treatment options. He understands no therapy will be curative. He would like to proceed with a trial of chemotherapy. We reviewed the specific toxicities associated with the  Taxol and carboplatin chemotherapy regimen. He will attend a chemotherapy teaching class.he will be scheduled for a first cycle of chemotherapy 03/04/2014.  Julieanne Manson, M.D.

## 2014-02-24 ENCOUNTER — Encounter: Payer: Self-pay | Admitting: *Deleted

## 2014-02-24 ENCOUNTER — Telehealth: Payer: Self-pay | Admitting: *Deleted

## 2014-02-24 ENCOUNTER — Telehealth: Payer: Self-pay | Admitting: Oncology

## 2014-02-24 MED ORDER — PROCHLORPERAZINE MALEATE 10 MG PO TABS: 10.0000 mg | ORAL_TABLET | Freq: Four times a day (QID) | ORAL | Status: DC | PRN

## 2014-02-24 NOTE — Telephone Encounter (Signed)
Made patient aware of how to take the Decadron premed and the reason for the IV chemotherapy and action of chemo. He understands to come to chemo class on Friday at 0930 and everything will be explained to him in full detail and the nurse will provide him his schedule and review. Made him aware the treatment will be every 3 weeks. He appreciates the call and the explaination and feels more at ease now. Has no one to bring him to appointment, so he will be riding the bus.

## 2014-02-24 NOTE — Telephone Encounter (Signed)
Philip Andrews and advised on March appts....Andrews will get sched at 3.20.15 appt

## 2014-02-26 ENCOUNTER — Other Ambulatory Visit: Payer: Medicaid Other

## 2014-02-26 ENCOUNTER — Other Ambulatory Visit: Payer: Self-pay | Admitting: *Deleted

## 2014-02-26 DIAGNOSIS — C799 Secondary malignant neoplasm of unspecified site: Secondary | ICD-10-CM

## 2014-02-26 MED ORDER — DEXAMETHASONE 4 MG PO TABS: ORAL_TABLET | ORAL | Status: DC

## 2014-02-28 ENCOUNTER — Other Ambulatory Visit: Payer: Self-pay | Admitting: Oncology

## 2014-03-01 ENCOUNTER — Other Ambulatory Visit: Payer: Self-pay | Admitting: Nurse Practitioner

## 2014-03-01 DIAGNOSIS — C801 Malignant (primary) neoplasm, unspecified: Secondary | ICD-10-CM

## 2014-03-03 ENCOUNTER — Telehealth: Payer: Self-pay | Admitting: *Deleted

## 2014-03-03 DIAGNOSIS — C799 Secondary malignant neoplasm of unspecified site: Secondary | ICD-10-CM

## 2014-03-03 MED ORDER — DEXAMETHASONE 4 MG PO TABS: ORAL_TABLET | ORAL | Status: DC

## 2014-03-03 NOTE — Telephone Encounter (Signed)
Patient has lost his 2nd prescription for Decadron that was called in last week. He can't find the medicine. Asking nurse to call in again to [facility] on [address]. Asking what time the pharmacy closes-I told him I do not know this. Gave him the phone # for pharmacy to call and he will see if they will have it ready by 3pm.

## 2014-03-04 ENCOUNTER — Other Ambulatory Visit: Payer: Self-pay | Admitting: Oncology

## 2014-03-04 ENCOUNTER — Other Ambulatory Visit (HOSPITAL_BASED_OUTPATIENT_CLINIC_OR_DEPARTMENT_OTHER): Payer: Medicaid Other

## 2014-03-04 ENCOUNTER — Ambulatory Visit (HOSPITAL_BASED_OUTPATIENT_CLINIC_OR_DEPARTMENT_OTHER): Payer: Medicaid Other

## 2014-03-04 VITALS — BP 103/63 | HR 76 | Temp 97.4°F | Resp 16

## 2014-03-04 DIAGNOSIS — C801 Malignant (primary) neoplasm, unspecified: Secondary | ICD-10-CM

## 2014-03-04 DIAGNOSIS — C799 Secondary malignant neoplasm of unspecified site: Secondary | ICD-10-CM

## 2014-03-04 DIAGNOSIS — C50919 Malignant neoplasm of unspecified site of unspecified female breast: Secondary | ICD-10-CM

## 2014-03-04 DIAGNOSIS — C779 Secondary and unspecified malignant neoplasm of lymph node, unspecified: Secondary | ICD-10-CM

## 2014-03-04 DIAGNOSIS — Z5111 Encounter for antineoplastic chemotherapy: Secondary | ICD-10-CM

## 2014-03-04 LAB — COMPREHENSIVE METABOLIC PANEL (CC13)
ALBUMIN: 3.8 g/dL (ref 3.5–5.0)
ALT: 10 U/L (ref 0–55)
ANION GAP: 11 meq/L (ref 3–11)
AST: 19 U/L (ref 5–34)
Alkaline Phosphatase: 82 U/L (ref 40–150)
BUN: 15.9 mg/dL (ref 7.0–26.0)
CALCIUM: 9.8 mg/dL (ref 8.4–10.4)
CO2: 24 meq/L (ref 22–29)
CREATININE: 0.9 mg/dL (ref 0.7–1.3)
Chloride: 103 mEq/L (ref 98–109)
Glucose: 207 mg/dl — ABNORMAL HIGH (ref 70–140)
POTASSIUM: 4.7 meq/L (ref 3.5–5.1)
Sodium: 138 mEq/L (ref 136–145)
Total Bilirubin: 0.52 mg/dL (ref 0.20–1.20)
Total Protein: 7.7 g/dL (ref 6.4–8.3)

## 2014-03-04 LAB — CBC WITH DIFFERENTIAL/PLATELET
BASO%: 0 % (ref 0.0–2.0)
Basophils Absolute: 0 10*3/uL (ref 0.0–0.1)
EOS%: 0 % (ref 0.0–7.0)
Eosinophils Absolute: 0 10*3/uL (ref 0.0–0.5)
HCT: 40.8 % (ref 38.4–49.9)
HEMOGLOBIN: 14.2 g/dL (ref 13.0–17.1)
LYMPH#: 0.2 10*3/uL — AB (ref 0.9–3.3)
LYMPH%: 6.9 % — ABNORMAL LOW (ref 14.0–49.0)
MCH: 31.5 pg (ref 27.2–33.4)
MCHC: 34.8 g/dL (ref 32.0–36.0)
MCV: 90.5 fL (ref 79.3–98.0)
MONO#: 0 10*3/uL — AB (ref 0.1–0.9)
MONO%: 0.7 % (ref 0.0–14.0)
NEUT#: 2.8 10*3/uL (ref 1.5–6.5)
NEUT%: 92.4 % — AB (ref 39.0–75.0)
NRBC: 0 % (ref 0–0)
Platelets: 285 10*3/uL (ref 140–400)
RBC: 4.51 10*6/uL (ref 4.20–5.82)
RDW: 14.2 % (ref 11.0–14.6)
WBC: 3.1 10*3/uL — AB (ref 4.0–10.3)

## 2014-03-04 MED ORDER — DEXAMETHASONE SODIUM PHOSPHATE 20 MG/5ML IJ SOLN
INTRAMUSCULAR | Status: AC
  Filled 2014-03-04: qty 5

## 2014-03-04 MED ORDER — PACLITAXEL CHEMO INJECTION 300 MG/50ML
306.0000 mg | Freq: Once | INTRAVENOUS | Status: AC
  Administered 2014-03-04: 306 mg via INTRAVENOUS
  Filled 2014-03-04: qty 51

## 2014-03-04 MED ORDER — PACLITAXEL CHEMO INJECTION 300 MG/50ML: 175.0000 mg/m2 | Freq: Once | INTRAVENOUS | Status: DC

## 2014-03-04 MED ORDER — FAMOTIDINE IN NACL 20-0.9 MG/50ML-% IV SOLN
20.0000 mg | Freq: Once | INTRAVENOUS | Status: AC
Start: 2014-03-04 — End: 2014-03-04
  Administered 2014-03-04: 20 mg via INTRAVENOUS

## 2014-03-04 MED ORDER — FAMOTIDINE IN NACL 20-0.9 MG/50ML-% IV SOLN
INTRAVENOUS | Status: AC
  Filled 2014-03-04: qty 50

## 2014-03-04 MED ORDER — SODIUM CHLORIDE 0.9 % IV SOLN
517.5000 mg | Freq: Once | INTRAVENOUS | Status: AC
  Administered 2014-03-04: 520 mg via INTRAVENOUS
  Filled 2014-03-04: qty 52

## 2014-03-04 MED ORDER — SODIUM CHLORIDE 0.9 % IV SOLN
Freq: Once | INTRAVENOUS | Status: AC
  Administered 2014-03-04: 11:00:00 via INTRAVENOUS

## 2014-03-04 MED ORDER — ONDANSETRON 16 MG/50ML IVPB (CHCC)
16.0000 mg | Freq: Once | INTRAVENOUS | Status: AC
  Administered 2014-03-04: 16 mg via INTRAVENOUS

## 2014-03-04 MED ORDER — DEXAMETHASONE SODIUM PHOSPHATE 20 MG/5ML IJ SOLN
20.0000 mg | Freq: Once | INTRAMUSCULAR | Status: AC
  Administered 2014-03-04: 20 mg via INTRAVENOUS

## 2014-03-04 MED ORDER — ONDANSETRON 16 MG/50ML IVPB (CHCC)
INTRAVENOUS | Status: AC
  Filled 2014-03-04: qty 16

## 2014-03-04 NOTE — Patient Instructions (Signed)
[facility] Discharge Instructions for Patients Receiving Chemotherapy  Today you received the following chemotherapy agents Taxol and Carboplatin.  To help prevent nausea and vomiting after your treatment, we encourage you to take your nausea medication.   If you develop nausea and vomiting that is not controlled by your nausea medication, call the clinic.   BELOW ARE SYMPTOMS THAT SHOULD BE REPORTED IMMEDIATELY:  *FEVER GREATER THAN 100.5 F  *CHILLS WITH OR WITHOUT FEVER  NAUSEA AND VOMITING THAT IS NOT CONTROLLED WITH YOUR NAUSEA MEDICATION  *UNUSUAL SHORTNESS OF BREATH  *UNUSUAL BRUISING OR BLEEDING  TENDERNESS IN MOUTH AND THROAT WITH OR WITHOUT PRESENCE OF ULCERS  *URINARY PROBLEMS  *BOWEL PROBLEMS  UNUSUAL RASH Items with * indicate a potential emergency and should be followed up as soon as possible.  Feel free to call the clinic you have any questions or concerns. The clinic phone number is [phone].    

## 2014-03-04 NOTE — Progress Notes (Signed)
When Carboplatin started patient stated that he was leaving at 1500 and he would take his IV out if he had to. At 1530, Carboplatin was not completed and patient stated he was leaving and wasn't waiting for infusion to stop. This nurse stopped the infusion and discontinued the IV. I instructed and encouraged patient to return the following day for his neulasta injection and all other future appointments.

## 2014-03-05 ENCOUNTER — Other Ambulatory Visit: Payer: Self-pay | Admitting: *Deleted

## 2014-03-05 ENCOUNTER — Telehealth: Payer: Self-pay | Admitting: *Deleted

## 2014-03-05 ENCOUNTER — Ambulatory Visit (HOSPITAL_BASED_OUTPATIENT_CLINIC_OR_DEPARTMENT_OTHER): Payer: Medicaid Other

## 2014-03-05 VITALS — BP 124/79 | HR 99 | Temp 98.5°F

## 2014-03-05 DIAGNOSIS — Z5189 Encounter for other specified aftercare: Secondary | ICD-10-CM

## 2014-03-05 DIAGNOSIS — C799 Secondary malignant neoplasm of unspecified site: Secondary | ICD-10-CM

## 2014-03-05 DIAGNOSIS — C779 Secondary and unspecified malignant neoplasm of lymph node, unspecified: Secondary | ICD-10-CM

## 2014-03-05 DIAGNOSIS — C801 Malignant (primary) neoplasm, unspecified: Secondary | ICD-10-CM

## 2014-03-05 DIAGNOSIS — C50919 Malignant neoplasm of unspecified site of unspecified female breast: Secondary | ICD-10-CM

## 2014-03-05 MED ORDER — PEGFILGRASTIM INJECTION 6 MG/0.6ML
6.0000 mg | Freq: Once | SUBCUTANEOUS | Status: AC
  Administered 2014-03-05: 6 mg via SUBCUTANEOUS
  Filled 2014-03-05: qty 0.6

## 2014-03-05 MED ORDER — OXYCODONE-ACETAMINOPHEN 5-325 MG PO TABS: 1.0000 | ORAL_TABLET | Freq: Four times a day (QID) | ORAL | Status: DC | PRN

## 2014-03-05 NOTE — Telephone Encounter (Signed)
PT. IS EATING AND TAKING FLUIDS. NO NAUSEA OR VOMITING. NO MOUTH SORENESS. NO DIARRHEA OR CONSTIPATION. PT. HAD A  NORMAL BOWEL MOVEMENT YESTERDAY. PT. REPORTS AN INCREASED AMOUNT OF COUGHING SINCE HIS CHEMOTHERAPY TREATMENT. HIS SPUTUM IS CLEAR IN COLOR BUT VERY THICK. ENCOURAGED PT. TO FORCE FLUIDS PARTICULARLY WATER SO HIS SPUTUM WILL BE THINNER AND EASIER TO GET UP FROM HIS LUNGS. ALSO EXPLAINED TO PT. THE IMPORTANCE OF COMING TODAY FOR HIS INJECTION. PT. WILL COME FOR HIS INJECTION AND PICK UP HIS PRESCRIPTION FOR PAIN. NOTIFIED JANICE BISHOP,LPN.

## 2014-03-15 ENCOUNTER — Other Ambulatory Visit: Payer: Medicaid Other

## 2014-03-15 ENCOUNTER — Ambulatory Visit: Payer: Medicaid Other | Admitting: Oncology

## 2014-03-17 ENCOUNTER — Telehealth: Payer: Self-pay | Admitting: *Deleted

## 2014-03-17 NOTE — Telephone Encounter (Signed)
Not able to make his lab/office visit on 4/6. Asking to reschedule. Taxol/Carbo 3/26 w/Neulasta 3/27. Next appt. 4/16 for L/OV/chemo

## 2014-03-17 NOTE — Telephone Encounter (Signed)
Philip Andrews is doing well-eating and drinking. No N/V. Says his stools look like there is mucus in them and light color. His urine is normal. Pain is controlled with Percocet, just complains that it sedates him too much. Made him aware he does not need to come in this week, can keep his visit on 4/16 as scheduled. He understands and agrees.

## 2014-03-21 ENCOUNTER — Other Ambulatory Visit: Payer: Self-pay | Admitting: Oncology

## 2014-03-25 ENCOUNTER — Ambulatory Visit (HOSPITAL_BASED_OUTPATIENT_CLINIC_OR_DEPARTMENT_OTHER): Payer: Medicaid Other | Admitting: Nurse Practitioner

## 2014-03-25 ENCOUNTER — Telehealth: Payer: Self-pay | Admitting: Oncology

## 2014-03-25 ENCOUNTER — Other Ambulatory Visit (HOSPITAL_BASED_OUTPATIENT_CLINIC_OR_DEPARTMENT_OTHER): Payer: Medicaid Other

## 2014-03-25 ENCOUNTER — Ambulatory Visit (HOSPITAL_BASED_OUTPATIENT_CLINIC_OR_DEPARTMENT_OTHER): Payer: Medicaid Other

## 2014-03-25 VITALS — BP 106/64 | HR 92 | Temp 97.8°F | Resp 18 | Ht 69.0 in | Wt 136.9 lb

## 2014-03-25 DIAGNOSIS — R222 Localized swelling, mass and lump, trunk: Secondary | ICD-10-CM

## 2014-03-25 DIAGNOSIS — C801 Malignant (primary) neoplasm, unspecified: Secondary | ICD-10-CM

## 2014-03-25 DIAGNOSIS — C50919 Malignant neoplasm of unspecified site of unspecified female breast: Secondary | ICD-10-CM

## 2014-03-25 DIAGNOSIS — C779 Secondary and unspecified malignant neoplasm of lymph node, unspecified: Secondary | ICD-10-CM

## 2014-03-25 DIAGNOSIS — M545 Low back pain, unspecified: Secondary | ICD-10-CM

## 2014-03-25 DIAGNOSIS — R109 Unspecified abdominal pain: Secondary | ICD-10-CM

## 2014-03-25 DIAGNOSIS — R079 Chest pain, unspecified: Secondary | ICD-10-CM

## 2014-03-25 DIAGNOSIS — C799 Secondary malignant neoplasm of unspecified site: Secondary | ICD-10-CM

## 2014-03-25 DIAGNOSIS — D649 Anemia, unspecified: Secondary | ICD-10-CM

## 2014-03-25 DIAGNOSIS — Z5111 Encounter for antineoplastic chemotherapy: Secondary | ICD-10-CM

## 2014-03-25 LAB — CBC WITH DIFFERENTIAL/PLATELET
BASO%: 1.5 % (ref 0.0–2.0)
Basophils Absolute: 0.1 10*3/uL (ref 0.0–0.1)
EOS%: 2.9 % (ref 0.0–7.0)
Eosinophils Absolute: 0.1 10*3/uL (ref 0.0–0.5)
HCT: 40.1 % (ref 38.4–49.9)
HGB: 14 g/dL (ref 13.0–17.1)
LYMPH%: 27.1 % (ref 14.0–49.0)
MCH: 31.6 pg (ref 27.2–33.4)
MCHC: 34.9 g/dL (ref 32.0–36.0)
MCV: 90.5 fL (ref 79.3–98.0)
MONO#: 1.2 10*3/uL — ABNORMAL HIGH (ref 0.1–0.9)
MONO%: 25 % — AB (ref 0.0–14.0)
NEUT#: 2.1 10*3/uL (ref 1.5–6.5)
NEUT%: 43.5 % (ref 39.0–75.0)
Platelets: 325 10*3/uL (ref 140–400)
RBC: 4.43 10*6/uL (ref 4.20–5.82)
RDW: 13.9 % (ref 11.0–14.6)
WBC: 4.8 10*3/uL (ref 4.0–10.3)
lymph#: 1.3 10*3/uL (ref 0.9–3.3)
nRBC: 0 % (ref 0–0)

## 2014-03-25 LAB — COMPREHENSIVE METABOLIC PANEL (CC13)
ALK PHOS: 107 U/L (ref 40–150)
ALT: 8 U/L (ref 0–55)
AST: 22 U/L (ref 5–34)
Albumin: 3.8 g/dL (ref 3.5–5.0)
Anion Gap: 8 mEq/L (ref 3–11)
BILIRUBIN TOTAL: 0.57 mg/dL (ref 0.20–1.20)
BUN: 12 mg/dL (ref 7.0–26.0)
CO2: 24 mEq/L (ref 22–29)
Calcium: 9.4 mg/dL (ref 8.4–10.4)
Chloride: 106 mEq/L (ref 98–109)
Creatinine: 0.8 mg/dL (ref 0.7–1.3)
Glucose: 81 mg/dl (ref 70–140)
POTASSIUM: 3.8 meq/L (ref 3.5–5.1)
Sodium: 138 mEq/L (ref 136–145)
TOTAL PROTEIN: 7.4 g/dL (ref 6.4–8.3)

## 2014-03-25 MED ORDER — FAMOTIDINE IN NACL 20-0.9 MG/50ML-% IV SOLN
INTRAVENOUS | Status: AC
  Filled 2014-03-25: qty 50

## 2014-03-25 MED ORDER — SODIUM CHLORIDE 0.9 % IV SOLN
Freq: Once | INTRAVENOUS | Status: AC
  Administered 2014-03-25: 11:00:00 via INTRAVENOUS

## 2014-03-25 MED ORDER — ONDANSETRON 16 MG/50ML IVPB (CHCC)
INTRAVENOUS | Status: AC
  Filled 2014-03-25: qty 16

## 2014-03-25 MED ORDER — PROCHLORPERAZINE MALEATE 10 MG PO TABS: 10.0000 mg | ORAL_TABLET | Freq: Four times a day (QID) | ORAL | Status: DC | PRN

## 2014-03-25 MED ORDER — ONDANSETRON 16 MG/50ML IVPB (CHCC)
16.0000 mg | Freq: Once | INTRAVENOUS | Status: AC
  Administered 2014-03-25: 16 mg via INTRAVENOUS

## 2014-03-25 MED ORDER — DEXAMETHASONE SODIUM PHOSPHATE 20 MG/5ML IJ SOLN
20.0000 mg | Freq: Once | INTRAMUSCULAR | Status: AC
  Administered 2014-03-25: 20 mg via INTRAVENOUS

## 2014-03-25 MED ORDER — OXYCODONE-ACETAMINOPHEN 5-325 MG PO TABS: 1.0000 | ORAL_TABLET | Freq: Four times a day (QID) | ORAL | Status: DC | PRN

## 2014-03-25 MED ORDER — PACLITAXEL CHEMO INJECTION 300 MG/50ML
175.0000 mg/m2 | Freq: Once | INTRAVENOUS | Status: AC
  Administered 2014-03-25: 306 mg via INTRAVENOUS
  Filled 2014-03-25: qty 51

## 2014-03-25 MED ORDER — FAMOTIDINE IN NACL 20-0.9 MG/50ML-% IV SOLN
20.0000 mg | Freq: Once | INTRAVENOUS | Status: AC
  Administered 2014-03-25: 20 mg via INTRAVENOUS

## 2014-03-25 MED ORDER — DEXAMETHASONE SODIUM PHOSPHATE 20 MG/5ML IJ SOLN
INTRAMUSCULAR | Status: AC
  Filled 2014-03-25: qty 5

## 2014-03-25 MED ORDER — SODIUM CHLORIDE 0.9 % IV SOLN
517.5000 mg | Freq: Once | INTRAVENOUS | Status: AC
  Administered 2014-03-25: 520 mg via INTRAVENOUS
  Filled 2014-03-25: qty 52

## 2014-03-25 NOTE — Telephone Encounter (Signed)
gv and printed appts ched and avs for pt for April and May....sed added tx.

## 2014-03-25 NOTE — Progress Notes (Signed)
[facility] OFFICE PROGRESS NOTE   Diagnosis:  Metastatic adenocarcinoma of probable lung primary.  INTERVAL HISTORY:   [name] completed cycle 1 Taxol/carboplatin on 03/04/2014 with Neulasta support. He tends to vomit with excessive coughing. He denies significant nausea following the chemotherapy. No mouth sores. No diarrhea or constipation. He reports stable numbness in the left hand and both feet which predated the start of chemotherapy. No signs of an allergic reaction with cycle 1 Taxol/carboplatin. He has left chest pain with coughing. He takes Percocet as needed. He recently noted recurrent pain at the right flank region.  Objective:  Vital signs in last 24 hours:  Blood pressure 106/64, pulse 92, temperature 97.8 F (36.6 C), temperature source Oral, resp. rate 18, height 5\' 9"  (1.753 m), weight 136 lb 14.4 oz (62.097 kg).    HEENT: No thrush or ulcerations. Resp: Lungs clear. Cardio: Regular cardiac rhythm. GI: Abdomen soft and nontender. No hepatomegaly. Vascular: No leg edema. Neuro: Vibratory sense mildly decreased over the fingertips per tuning fork exam.  Skin: Radiation hyperpigmentation at the right flank region. MSK: Full appearance right flank region.    Lab Results:  Lab Results  Component Value Date   WBC 4.8 03/25/2014   HGB 14.0 03/25/2014   HCT 40.1 03/25/2014   MCV 90.5 03/25/2014   PLT 325 03/25/2014   NEUTROABS 2.1 03/25/2014    Imaging:  No results found.  Medications: I have reviewed the patient's current medications.  Assessment/Plan: 1. Metastatic adenocarcinoma involving a pericardial effusion, TTF-1 and Napsin-A negative.  Status post pericardial window procedure and bronchoscopy on 10/31/2013. No tumor found at bronchoscopy and the pericardial tissue was negative for malignancy.  Staging PET scan 11/17/2013 with a hypermetabolic 1.6 cm left upper lobe suprahilar nodule with SUV max 9.1; medial to that nodule is a 6 mm  nodule not showing any increased metabolic activity; multiple hypermetabolic small mediastinal and hilar lymph nodes bilaterally; additional nodes present in the AP window, prevascular space and both hila; focally increased metabolic activity within the soft tissues of the right back with SUV max 6.7.  Status post palliative radiation to a soft tissue metastasis at the right back 11/26/2013 through 12/15/2013.  Restaging CT chest and abdomen 02/18/2014. Interval enlargement of the left upper lobe pulmonary nodule; enlargement of pericardial lesions overlying the heart; enlargement of bilateral hilar and mediastinal lymph nodes; ill-defined lesion in the liver not identified on prior studies. Previously suspected soft tissue lesion in the paraspinous muscular posterior to the right kidney not confidently identified on current study. Cycle 1 Taxol/carboplatin 03/04/2014. 2. Respiratory failure/cardiac tamponade secondary to #1. Resolved. 3. History of polysubstance abuse. 4. Acute renal failure. Resolved. 5. Mild normocytic anemia. 6. Left hilar mass on the noncontrast chest CT 10/24/2013. 7. Right low back/flank pain. Likely secondary to a soft tissue metastasis at the right back. He completed palliative radiation 11/26/2013 through 12/15/2013. The pain resolved. He reports mild recurrence of the flank pain at today's visit. 8. Intermittent left chest pain. Question secondary to pericardial metastases.   Disposition: He appears stable. He has completed one cycle of Taxol/carboplatin. Plan to proceed with cycle 2 today as scheduled. He will again receive Neulasta support.   We will see him in followup in 3 weeks prior to cycle 3 Taxol/carboplatin. We will refer him for a restaging CT evaluation after completion of cycle 3.   He will contact the office prior to his next visit with any problems.  Plan reviewed with Dr.  Sherrill.    Owens Shark ANP/GNP-BC   03/25/2014  10:24  AM

## 2014-03-25 NOTE — Patient Instructions (Signed)
[facility] Discharge Instructions for Patients Receiving Chemotherapy  Today you received the following chemotherapy agents taxol/carboplatin  To help prevent nausea and vomiting after your treatment, we encourage you to take your nausea medication as directed   If you develop nausea and vomiting that is not controlled by your nausea medication, call the clinic.   BELOW ARE SYMPTOMS THAT SHOULD BE REPORTED IMMEDIATELY:  *FEVER GREATER THAN 100.5 F  *CHILLS WITH OR WITHOUT FEVER  NAUSEA AND VOMITING THAT IS NOT CONTROLLED WITH YOUR NAUSEA MEDICATION  *UNUSUAL SHORTNESS OF BREATH  *UNUSUAL BRUISING OR BLEEDING  TENDERNESS IN MOUTH AND THROAT WITH OR WITHOUT PRESENCE OF ULCERS  *URINARY PROBLEMS  *BOWEL PROBLEMS  UNUSUAL RASH Items with * indicate a potential emergency and should be followed up as soon as possible.  Feel free to call the clinic you have any questions or concerns. The clinic phone number is [phone].  

## 2014-03-26 ENCOUNTER — Ambulatory Visit (HOSPITAL_BASED_OUTPATIENT_CLINIC_OR_DEPARTMENT_OTHER): Payer: Medicaid Other

## 2014-03-26 VITALS — BP 115/86 | HR 103 | Temp 98.2°F

## 2014-03-26 DIAGNOSIS — C50919 Malignant neoplasm of unspecified site of unspecified female breast: Secondary | ICD-10-CM

## 2014-03-26 DIAGNOSIS — C801 Malignant (primary) neoplasm, unspecified: Secondary | ICD-10-CM

## 2014-03-26 DIAGNOSIS — C779 Secondary and unspecified malignant neoplasm of lymph node, unspecified: Secondary | ICD-10-CM

## 2014-03-26 DIAGNOSIS — C799 Secondary malignant neoplasm of unspecified site: Secondary | ICD-10-CM

## 2014-03-26 DIAGNOSIS — Z5189 Encounter for other specified aftercare: Secondary | ICD-10-CM

## 2014-03-26 MED ORDER — PEGFILGRASTIM INJECTION 6 MG/0.6ML
6.0000 mg | Freq: Once | SUBCUTANEOUS | Status: AC
  Administered 2014-03-26: 6 mg via SUBCUTANEOUS
  Filled 2014-03-26: qty 0.6

## 2014-04-11 ENCOUNTER — Other Ambulatory Visit: Payer: Self-pay | Admitting: Oncology

## 2014-04-15 ENCOUNTER — Ambulatory Visit: Payer: Medicaid Other

## 2014-04-15 ENCOUNTER — Telehealth: Payer: Self-pay | Admitting: *Deleted

## 2014-04-15 ENCOUNTER — Ambulatory Visit: Payer: Medicaid Other | Admitting: Oncology

## 2014-04-15 ENCOUNTER — Other Ambulatory Visit: Payer: Medicaid Other

## 2014-04-15 NOTE — Telephone Encounter (Signed)
Left VM on mobile # for patient to call office with status update and his plans for office visit and continued chemotherapy.

## 2014-04-16 ENCOUNTER — Telehealth: Payer: Self-pay | Admitting: *Deleted

## 2014-04-16 ENCOUNTER — Ambulatory Visit: Payer: Medicaid Other

## 2014-04-16 NOTE — Telephone Encounter (Signed)
Call from pt requesting to reschedule missed appts from 04/15/14. Order sent to schedulers.

## 2014-04-20 ENCOUNTER — Telehealth: Payer: Self-pay | Admitting: Oncology

## 2014-04-20 NOTE — Telephone Encounter (Signed)
s.w. pt and advised on 5.15 appt...per Dr. Benay Spice inbox

## 2014-04-23 ENCOUNTER — Ambulatory Visit: Payer: Medicaid Other

## 2014-04-23 ENCOUNTER — Ambulatory Visit (HOSPITAL_BASED_OUTPATIENT_CLINIC_OR_DEPARTMENT_OTHER): Payer: Medicaid Other | Admitting: Nurse Practitioner

## 2014-04-23 ENCOUNTER — Telehealth: Payer: Self-pay | Admitting: Oncology

## 2014-04-23 ENCOUNTER — Other Ambulatory Visit (HOSPITAL_BASED_OUTPATIENT_CLINIC_OR_DEPARTMENT_OTHER): Payer: Medicaid Other

## 2014-04-23 ENCOUNTER — Other Ambulatory Visit: Payer: Self-pay

## 2014-04-23 VITALS — BP 119/66 | HR 91 | Temp 98.4°F | Resp 18 | Ht 69.0 in | Wt 134.5 lb

## 2014-04-23 DIAGNOSIS — C779 Secondary and unspecified malignant neoplasm of lymph node, unspecified: Secondary | ICD-10-CM

## 2014-04-23 DIAGNOSIS — C799 Secondary malignant neoplasm of unspecified site: Secondary | ICD-10-CM

## 2014-04-23 DIAGNOSIS — C50919 Malignant neoplasm of unspecified site of unspecified female breast: Secondary | ICD-10-CM

## 2014-04-23 DIAGNOSIS — C801 Malignant (primary) neoplasm, unspecified: Secondary | ICD-10-CM

## 2014-04-23 DIAGNOSIS — J439 Emphysema, unspecified: Secondary | ICD-10-CM

## 2014-04-23 LAB — COMPREHENSIVE METABOLIC PANEL (CC13)
ALBUMIN: 3.7 g/dL (ref 3.5–5.0)
ALT: 11 U/L (ref 0–55)
AST: 25 U/L (ref 5–34)
Alkaline Phosphatase: 102 U/L (ref 40–150)
Anion Gap: 12 mEq/L — ABNORMAL HIGH (ref 3–11)
BUN: 8.2 mg/dL (ref 7.0–26.0)
CO2: 22 meq/L (ref 22–29)
Calcium: 9.6 mg/dL (ref 8.4–10.4)
Chloride: 105 mEq/L (ref 98–109)
Creatinine: 0.8 mg/dL (ref 0.7–1.3)
GLUCOSE: 94 mg/dL (ref 70–140)
POTASSIUM: 3.7 meq/L (ref 3.5–5.1)
SODIUM: 139 meq/L (ref 136–145)
TOTAL PROTEIN: 7.2 g/dL (ref 6.4–8.3)
Total Bilirubin: 0.86 mg/dL (ref 0.20–1.20)

## 2014-04-23 LAB — CBC WITH DIFFERENTIAL/PLATELET
BASO%: 0.8 % (ref 0.0–2.0)
Basophils Absolute: 0 10*3/uL (ref 0.0–0.1)
EOS%: 7.6 % — AB (ref 0.0–7.0)
Eosinophils Absolute: 0.3 10*3/uL (ref 0.0–0.5)
HEMATOCRIT: 38.6 % (ref 38.4–49.9)
HGB: 13.3 g/dL (ref 13.0–17.1)
LYMPH%: 29.5 % (ref 14.0–49.0)
MCH: 32.4 pg (ref 27.2–33.4)
MCHC: 34.5 g/dL (ref 32.0–36.0)
MCV: 94.1 fL (ref 79.3–98.0)
MONO#: 0.7 10*3/uL (ref 0.1–0.9)
MONO%: 17.6 % — ABNORMAL HIGH (ref 0.0–14.0)
NEUT#: 1.7 10*3/uL (ref 1.5–6.5)
NEUT%: 44.5 % (ref 39.0–75.0)
Platelets: 273 10*3/uL (ref 140–400)
RBC: 4.1 10*6/uL — AB (ref 4.20–5.82)
RDW: 14.6 % (ref 11.0–14.6)
WBC: 3.7 10*3/uL — AB (ref 4.0–10.3)
lymph#: 1.1 10*3/uL (ref 0.9–3.3)

## 2014-04-23 MED ORDER — OXYCODONE-ACETAMINOPHEN 5-325 MG PO TABS: 1.0000 | ORAL_TABLET | Freq: Four times a day (QID) | ORAL | Status: DC | PRN

## 2014-04-23 MED ORDER — IPRATROPIUM-ALBUTEROL 20-100 MCG/ACT IN AERS: 1.0000 | INHALATION_SPRAY | Freq: Four times a day (QID) | RESPIRATORY_TRACT | Status: AC | PRN

## 2014-04-23 MED ORDER — ONDANSETRON HCL 8 MG PO TABS: 8.0000 mg | ORAL_TABLET | Freq: Three times a day (TID) | ORAL | Status: AC | PRN

## 2014-04-23 NOTE — Telephone Encounter (Signed)
Called pt and both numbers are busy left message with the friend tel# to call us regarding appt, emailed Sharyn Lull for chemo appt

## 2014-04-23 NOTE — Progress Notes (Addendum)
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    INTERVAL HISTORY:  Philip Andrews completed cycle 2 Taxol/carboplatin on 03/25/2014. He received Neulasta support. He continues to have intermittent nausea/vomiting which did not increase following the chemotherapy. He is taking Compazine without relief. No mouth sores. No diarrhea. He denies back pain. He continues to have intermittent pain at the left chest with coughing and deep breathing. He is taking his pain medication with Aleve and Tylenol. He notes a mild increase in dyspnea on exertion. Stable numbness in the hands and feet which predated the start of chemotherapy. He denies bone pain following the Neulasta injection. He reports a good appetite. He is drinking Ensure.  Objective:  Vital signs in last 24 hours:  Blood pressure 119/66, pulse 91, temperature 98.4 F (36.9 C), temperature source Oral, resp. rate 18, height 5\' 9"  (1.753 m), weight 134 lb 8 oz (61.009 kg), SpO2 99.00%.    HEENT: No thrush or ulcerations. Resp: Lungs clear. Cardio: Regular cardiac rhythm. GI: Soft and nontender. No hepatomegaly. Vascular: No leg edema. Neuro: Very minimal decrease in vibratory sense over the fingertips per tuning fork exam.  Skin: Radiation skin change at the right flank region with hyperpigmentation.    Lab Results:  Lab Results  Component Value Date   WBC 3.7* 04/23/2014   HGB 13.3 04/23/2014   HCT 38.6 04/23/2014   MCV 94.1 04/23/2014   PLT 273 04/23/2014   NEUTROABS 1.7 04/23/2014    Imaging:  No results found.  Medications: I have reviewed the patient's current medications.  Assessment/Plan: 1. Metastatic adenocarcinoma involving a pericardial effusion, TTF-1 and Napsin-A negative.  Status post pericardial window procedure and bronchoscopy on 10/31/2013. No tumor found at bronchoscopy and the pericardial tissue was negative for malignancy.  Staging PET  scan 11/17/2013 with a hypermetabolic 1.6 cm left upper lobe suprahilar nodule with SUV max 9.1; medial to that nodule is a 6 mm nodule not showing any increased metabolic activity; multiple hypermetabolic small mediastinal and hilar lymph nodes bilaterally; additional nodes present in the AP window, prevascular space and both hila; focally increased metabolic activity within the soft tissues of the right back with SUV max 6.7.  Status post palliative radiation to a soft tissue metastasis at the right back 11/26/2013 through 12/15/2013.  Restaging CT chest and abdomen 02/18/2014. Interval enlargement of the left upper lobe pulmonary nodule; enlargement of pericardial lesions overlying the heart; enlargement of bilateral hilar and mediastinal lymph nodes; ill-defined lesion in the liver not identified on prior studies. Previously suspected soft tissue lesion in the paraspinous muscular posterior to the right kidney not confidently identified on current study.  Cycle 1 Taxol/carboplatin 03/04/2014. Cycle 2 Taxol/carboplatin 03/25/2014. 2. Respiratory failure/cardiac tamponade secondary to #1. Resolved. 3. History of polysubstance abuse. 4. Acute renal failure. Resolved. 5. Mild normocytic anemia. 6. Left hilar mass on the noncontrast chest CT 10/24/2013. 7. Right low back/flank pain. Likely secondary to a soft tissue metastasis at the right back. He completed palliative radiation 11/26/2013 through 12/15/2013. Resolved.  8. Intermittent left chest pain. Question secondary to pericardial metastases.   Disposition: He appears stable. He has completed 2 cycles of Taxol/carboplatin. We recommended he proceed with cycle 3 today as scheduled to be followed by a restaging chest CT in the next few weeks. He does not want to proceed with chemotherapy today, instead would like to return for cycle 3 next week.  We will schedule cycle 3  Taxol/carboplatin on 04/28/2014, restaging CT evaluation 05/12/2014 and a  followup visit to review the results on 05/14/2014.  He will continue Percocet and ibuprofen as needed for the chest pain. He was given a prescription for Zofran 8 mg every 8 hours as needed for nausea/vomiting and a new prescription was given for his Combivent inhaler.  He will contact the office in the interim with any problems.  Patient seen with Dr. Benay Spice.    Owens Shark ANP/GNP-BC   04/23/2014  9:52 AM  This was a shared visit with Ned Card. Philip Andrews has completed 2 cycles of Taxol/carboplatin. He declines chemotherapy today, but will reschedule cycle 3 for 04/28/2014. He will undergo a restaging CT after cycle 3.  Julieanne Manson, M.D.

## 2014-04-26 ENCOUNTER — Telehealth: Payer: Self-pay | Admitting: *Deleted

## 2014-04-26 NOTE — Telephone Encounter (Signed)
Per staff message and POF I have scheduled appts. Advised scheduler to move lab appt JMW

## 2014-04-27 ENCOUNTER — Telehealth: Payer: Self-pay | Admitting: Oncology

## 2014-04-27 NOTE — Telephone Encounter (Signed)
Called pt, no answers on all phone provided

## 2014-04-28 ENCOUNTER — Other Ambulatory Visit: Payer: Medicaid Other

## 2014-04-28 ENCOUNTER — Ambulatory Visit: Payer: Medicaid Other

## 2014-04-28 MED ORDER — ONDANSETRON 16 MG/50ML IVPB (CHCC)
INTRAVENOUS | Status: AC
  Filled 2014-04-28: qty 16

## 2014-04-28 MED ORDER — DEXAMETHASONE SODIUM PHOSPHATE 20 MG/5ML IJ SOLN
INTRAMUSCULAR | Status: AC
  Filled 2014-04-28: qty 5

## 2014-04-28 MED ORDER — FAMOTIDINE IN NACL 20-0.9 MG/50ML-% IV SOLN
INTRAVENOUS | Status: AC
  Filled 2014-04-28: qty 50

## 2014-04-29 ENCOUNTER — Ambulatory Visit: Payer: Medicaid Other

## 2014-05-06 ENCOUNTER — Ambulatory Visit: Payer: Medicaid Other

## 2014-05-07 ENCOUNTER — Ambulatory Visit: Payer: Medicaid Other

## 2014-05-12 ENCOUNTER — Encounter (HOSPITAL_COMMUNITY): Payer: Self-pay

## 2014-05-12 ENCOUNTER — Ambulatory Visit (HOSPITAL_COMMUNITY)
Admission: RE | Admit: 2014-05-12 | Discharge: 2014-05-12 | Disposition: A | Discharge status: Home / Self Care | Payer: Medicaid Other | Source: Ambulatory Visit | Attending: Nurse Practitioner | Admitting: Nurse Practitioner

## 2014-05-12 ENCOUNTER — Other Ambulatory Visit (HOSPITAL_BASED_OUTPATIENT_CLINIC_OR_DEPARTMENT_OTHER): Payer: Medicaid Other

## 2014-05-12 ENCOUNTER — Other Ambulatory Visit: Payer: Medicaid Other

## 2014-05-12 DIAGNOSIS — D649 Anemia, unspecified: Secondary | ICD-10-CM

## 2014-05-12 DIAGNOSIS — C801 Malignant (primary) neoplasm, unspecified: Secondary | ICD-10-CM

## 2014-05-12 DIAGNOSIS — C799 Secondary malignant neoplasm of unspecified site: Secondary | ICD-10-CM

## 2014-05-12 DIAGNOSIS — I700 Atherosclerosis of aorta: Secondary | ICD-10-CM | POA: Insufficient documentation

## 2014-05-12 DIAGNOSIS — R911 Solitary pulmonary nodule: Secondary | ICD-10-CM | POA: Insufficient documentation

## 2014-05-12 DIAGNOSIS — C50919 Malignant neoplasm of unspecified site of unspecified female breast: Secondary | ICD-10-CM

## 2014-05-12 DIAGNOSIS — C779 Secondary and unspecified malignant neoplasm of lymph node, unspecified: Secondary | ICD-10-CM

## 2014-05-12 DIAGNOSIS — J438 Other emphysema: Secondary | ICD-10-CM | POA: Insufficient documentation

## 2014-05-12 DIAGNOSIS — R599 Enlarged lymph nodes, unspecified: Secondary | ICD-10-CM | POA: Insufficient documentation

## 2014-05-12 LAB — COMPREHENSIVE METABOLIC PANEL (CC13)
ALT: 10 U/L (ref 0–55)
ANION GAP: 15 meq/L — AB (ref 3–11)
AST: 25 U/L (ref 5–34)
Albumin: 3.5 g/dL (ref 3.5–5.0)
Alkaline Phosphatase: 101 U/L (ref 40–150)
BILIRUBIN TOTAL: 0.58 mg/dL (ref 0.20–1.20)
BUN: 10.8 mg/dL (ref 7.0–26.0)
CO2: 22 meq/L (ref 22–29)
Calcium: 9.2 mg/dL (ref 8.4–10.4)
Chloride: 101 mEq/L (ref 98–109)
Creatinine: 0.8 mg/dL (ref 0.7–1.3)
Glucose: 94 mg/dl (ref 70–140)
Potassium: 3.7 mEq/L (ref 3.5–5.1)
Sodium: 138 mEq/L (ref 136–145)
Total Protein: 7.6 g/dL (ref 6.4–8.3)

## 2014-05-12 LAB — CBC WITH DIFFERENTIAL/PLATELET
BASO%: 0.6 % (ref 0.0–2.0)
Basophils Absolute: 0 10*3/uL (ref 0.0–0.1)
EOS%: 3.3 % (ref 0.0–7.0)
Eosinophils Absolute: 0.1 10*3/uL (ref 0.0–0.5)
HCT: 41.6 % (ref 38.4–49.9)
HGB: 14.4 g/dL (ref 13.0–17.1)
LYMPH%: 26.3 % (ref 14.0–49.0)
MCH: 32.8 pg (ref 27.2–33.4)
MCHC: 34.6 g/dL (ref 32.0–36.0)
MCV: 94.8 fL (ref 79.3–98.0)
MONO#: 0.5 10*3/uL (ref 0.1–0.9)
MONO%: 13.6 % (ref 0.0–14.0)
NEUT%: 56.2 % (ref 39.0–75.0)
NEUTROS ABS: 2 10*3/uL (ref 1.5–6.5)
NRBC: 0 % (ref 0–0)
PLATELETS: 241 10*3/uL (ref 140–400)
RBC: 4.39 10*6/uL (ref 4.20–5.82)
RDW: 13 % (ref 11.0–14.6)
WBC: 3.6 10*3/uL — ABNORMAL LOW (ref 4.0–10.3)
lymph#: 1 10*3/uL (ref 0.9–3.3)

## 2014-05-12 MED ORDER — IOHEXOL 300 MG/ML  SOLN
100.0000 mL | Freq: Once | INTRAMUSCULAR | Status: AC | PRN
  Administered 2014-05-12: 100 mL via INTRAVENOUS

## 2014-05-14 ENCOUNTER — Ambulatory Visit: Payer: Medicaid Other | Admitting: Oncology

## 2014-05-18 ENCOUNTER — Telehealth: Payer: Self-pay | Admitting: *Deleted

## 2014-05-18 NOTE — Telephone Encounter (Signed)
Left message at home # for pt to call office.

## 2014-05-18 NOTE — Telephone Encounter (Signed)
Message copied by Brien Few on Tue May 18, 2014  9:29 AM ------      Message from: Ladell Pier      Created: Fri May 14, 2014  5:01 PM       Please call patient, mild increase in size of lung nodule and chest nodes, needs to schedule f/u appt. Next 1 mon. [place] or [place] ------

## 2014-05-19 ENCOUNTER — Emergency Department (HOSPITAL_COMMUNITY): Payer: Medicaid Other

## 2014-05-19 ENCOUNTER — Encounter (HOSPITAL_COMMUNITY): Payer: Self-pay | Admitting: Emergency Medicine

## 2014-05-19 ENCOUNTER — Emergency Department (HOSPITAL_COMMUNITY)
Admission: EM | Admit: 2014-05-19 | Discharge: 2014-05-19 | Discharge status: Against Medical Advice | Payer: Medicaid Other | Attending: Emergency Medicine | Admitting: Emergency Medicine

## 2014-05-19 ENCOUNTER — Ambulatory Visit: Payer: Medicaid Other

## 2014-05-19 DIAGNOSIS — Z8701 Personal history of pneumonia (recurrent): Secondary | ICD-10-CM | POA: Insufficient documentation

## 2014-05-19 DIAGNOSIS — Z8719 Personal history of other diseases of the digestive system: Secondary | ICD-10-CM | POA: Insufficient documentation

## 2014-05-19 DIAGNOSIS — Z923 Personal history of irradiation: Secondary | ICD-10-CM | POA: Insufficient documentation

## 2014-05-19 DIAGNOSIS — R079 Chest pain, unspecified: Secondary | ICD-10-CM

## 2014-05-19 DIAGNOSIS — C7800 Secondary malignant neoplasm of unspecified lung: Secondary | ICD-10-CM

## 2014-05-19 DIAGNOSIS — Z79899 Other long term (current) drug therapy: Secondary | ICD-10-CM | POA: Insufficient documentation

## 2014-05-19 DIAGNOSIS — F17200 Nicotine dependence, unspecified, uncomplicated: Secondary | ICD-10-CM | POA: Insufficient documentation

## 2014-05-19 DIAGNOSIS — R110 Nausea: Secondary | ICD-10-CM | POA: Insufficient documentation

## 2014-05-19 DIAGNOSIS — C801 Malignant (primary) neoplasm, unspecified: Secondary | ICD-10-CM | POA: Insufficient documentation

## 2014-05-19 DIAGNOSIS — C3490 Malignant neoplasm of unspecified part of unspecified bronchus or lung: Secondary | ICD-10-CM

## 2014-05-19 MED ORDER — SODIUM CHLORIDE 0.9 % IV SOLN
INTRAVENOUS | Status: DC
Start: 2014-05-19 — End: 2014-05-19

## 2014-05-19 MED ORDER — ONDANSETRON HCL 4 MG/2ML IJ SOLN
4.0000 mg | Freq: Once | INTRAMUSCULAR | Status: DC
Start: 2014-05-19 — End: 2014-05-19

## 2014-05-19 MED ORDER — HYDROMORPHONE HCL PF 1 MG/ML IJ SOLN: 1.0000 mg | Freq: Once | INTRAMUSCULAR | Status: DC

## 2014-05-19 NOTE — ED Notes (Signed)
Bed: QI69 Expected date:  Expected time:  Means of arrival:  Comments: CA pt/sob

## 2014-05-19 NOTE — ED Notes (Addendum)
Per [facility].- Pt resides at home. Cancer pt- Recently DX with pneumonia. Productive cough. Denies fever. C/o of nausea/shortness of breath. Used inhaler without relief. Rhonchi throughout. C/o of blood in urine x 3 weeks. Last radiation TX 3 weeks ago. Pain in left side of chest increased upon inspiration and palpation. Unable to rate.

## 2014-05-19 NOTE — ED Provider Notes (Signed)
CSN: [id number]     Arrival date & time 05/19/14  0847 History   First MD Initiated Contact with Patient 05/19/14 [phone]     Chief Complaint  Patient presents with  . Shortness of Breath  . Cough  . Pneumonia  . Cancer  . Nausea     (Consider location/radiation/quality/duration/timing/severity/associated sxs/prior Treatment) Patient is a 58 y.o. male presenting with shortness of breath and cough. The history is provided by the patient.  Shortness of Breath Associated symptoms: chest pain and cough   Associated symptoms: no abdominal pain, no fever, no headaches, no neck pain, no rash, no sore throat and no vomiting   Cough Associated symptoms: chest pain and shortness of breath   Associated symptoms: no chills, no fever, no headaches, no rash and no sore throat   pt with hx metastatic lung cancer, c/o worsening of his chronic chest pain, and non productive cough.  Pt notes pain mid to left chest x many months. Constant. Worse w coughing spell, palpation chest, and certain movements or changes in position. States that pain has become progressively worse. Increased non prod cough in past week. No fever or chills. Denies sore throat, runny nose, or other uri c/o. No leg pain or swelling. No hx dvt or pe. No change whether upright or supine. Pt w hx pleural and pericardial disease, hx pericardial window.   Pt denies recent chemo or radiation tx.      Past Medical History  Diagnosis Date  . Substance abuse     per H&P  . Lung nodules   . Lung nodules   . Gastric ulcer   . History of radiation therapy 11/26/13-12/15/13    30 gray to right lower posterior chest wall mass  . Metastatic adenocarcinoma 02/23/2014   Past Surgical History  Procedure Laterality Date  . Subxyphoid pericardial window N/A 10/30/2013    Procedure: SUBXYPHOID PERICARDIAL WINDOW;  Surgeon: Ivin Poot, MD;  Location: [facility];  Service: Thoracic;  Laterality: N/A;  . Video bronchoscopy N/A 10/30/2013    Procedure:  VIDEO BRONCHOSCOPY;  Surgeon: Ivin Poot, MD;  Location: [facility];  Service: Thoracic;  Laterality: N/A;   No family history on file. History  Substance Use Topics  . Smoking status: Current Every Day Smoker -- 0.04 packs/day for 44 years    Types: Cigarettes  . Smokeless tobacco: Not on file  . Alcohol Use: 1.2 oz/week    2 Cans of beer per week    Review of Systems  Constitutional: Negative for fever and chills.  HENT: Negative for sore throat.   Eyes: Negative for redness.  Respiratory: Positive for cough and shortness of breath.   Cardiovascular: Positive for chest pain. Negative for leg swelling.  Gastrointestinal: Negative for vomiting, abdominal pain and diarrhea.  Genitourinary: Negative for flank pain.  Musculoskeletal: Negative for back pain and neck pain.  Skin: Negative for rash.  Neurological: Negative for headaches.  Hematological: Does not bruise/bleed easily.  Psychiatric/Behavioral: Negative for confusion.      Allergies  Benadryl  Home Medications   Prior to Admission medications   Medication Sig Start Date End Date Taking? Authorizing Provider  Ipratropium-Albuterol (COMBIVENT) 20-100 MCG/ACT AERS respimat Inhale 1 puff into the lungs every 6 (six) hours as needed for wheezing or shortness of breath. 04/23/14  Yes Owens Shark, NP  dexamethasone (DECADRON) 4 MG tablet Take 2.5 tabs (10 mg) at bedtime the night before first chemotherapy and at 6am the day of first  chemotherapy. 03/03/14   Ladell Pier, MD  ondansetron (ZOFRAN) 8 MG tablet Take 1 tablet (8 mg total) by mouth every 8 (eight) hours as needed for nausea or vomiting. 04/23/14   Owens Shark, NP  oxyCODONE-acetaminophen (PERCOCET/ROXICET) 5-325 MG per tablet Take 1-2 tablets by mouth every 6 (six) hours as needed for severe pain. 04/23/14   Owens Shark, NP  prochlorperazine (COMPAZINE) 10 MG tablet Take 1 tablet (10 mg total) by mouth every 6 (six) hours as needed for nausea or vomiting.  03/25/14   Owens Shark, NP   BP 105/70  Pulse 95  Temp(Src) 98.1 F (36.7 C) (Oral)  Resp 20  SpO2 96% Physical Exam  Nursing note and vitals reviewed. Constitutional: He is oriented to person, place, and time.  Pt very thin, cachectic appearing.   HENT:  Head: Atraumatic.  Eyes: Conjunctivae are normal. Pupils are equal, round, and reactive to light. No scleral icterus.  Neck: Neck supple. No JVD present. No tracheal deviation present.  Cardiovascular: Normal rate, regular rhythm, normal heart sounds and intact distal pulses.  Exam reveals no gallop and no friction rub.   No murmur heard. Pulmonary/Chest: Effort normal and breath sounds normal. No accessory muscle usage. No respiratory distress. He exhibits tenderness.  Abdominal: Soft. Bowel sounds are normal. He exhibits no distension and no mass. There is no tenderness. There is no rebound and no guarding.  Musculoskeletal: Normal range of motion. He exhibits no edema and no tenderness.  Neurological: He is alert and oriented to person, place, and time.  Skin: Skin is warm and dry. He is not diaphoretic.  Psychiatric: He has a normal mood and affect.    ED Course  Procedures (including critical care time) Labs Review     EKG Interpretation   Date/Time:  Wednesday May 19 2014 08:47:39 EDT Ventricular Rate:  96 PR Interval:  160 QRS Duration: 92 QT Interval:  353 QTC Calculation: 446 R Axis:   53 Text Interpretation:  Sinus rhythm Nonspecific ST abnormality `no  worrisome st/t changes since prior ecg Confirmed by Ashok Cordia  MD, Lennette Bihari  ([id number]) on 05/19/2014 9:13:27 AM      MDM  In course H and P, pt with very short, sarcastic/belligerent, inappropriate comments and answers to questions. Pt reassured that we will give pain meds and eval symptoms.  (called out of room to emergent, hypotensive pt)  Continuous pulse ox and monitor. Ecg. Cxr. Labs.  Iv ns. Dilaudid 1 mg iv. zofran iv.  Reviewed nursing notes and prior  charts for additional history.   On rechecking patient - pt not in room, had left ED AMA without notifying me, prior to receiving meds or completing workup and treatment.       Mirna Mires, MD 05/19/14 1057

## 2014-05-19 NOTE — ED Notes (Signed)
Pt became very upset that he had not yet had pain medication ordered.  Dr. Ashok Cordia notified.  Orders received.  Pt became impatient while waiting and decided that he did not want to wait for pain medication to be drawn up.  Pt was explained the risks of leaving.  Pt states "I don't care! I'm gonna go hurt at home!".  Pt got dressed and ambulated out of room with no assistance.

## 2014-05-20 ENCOUNTER — Ambulatory Visit: Payer: Medicaid Other

## 2014-05-25 ENCOUNTER — Emergency Department (HOSPITAL_COMMUNITY): Payer: Medicaid Other

## 2014-05-25 ENCOUNTER — Telehealth: Payer: Self-pay

## 2014-05-25 ENCOUNTER — Telehealth: Payer: Self-pay | Admitting: *Deleted

## 2014-05-25 ENCOUNTER — Observation Stay (HOSPITAL_COMMUNITY)
Admission: EM | Admit: 2014-05-25 | Discharge: 2014-05-26 | Disposition: A | Discharge status: Home / Self Care | Payer: Medicaid Other | Attending: Internal Medicine | Admitting: Internal Medicine

## 2014-05-25 ENCOUNTER — Encounter (HOSPITAL_COMMUNITY): Payer: Self-pay | Admitting: Emergency Medicine

## 2014-05-25 ENCOUNTER — Telehealth: Payer: Self-pay | Admitting: Oncology

## 2014-05-25 DIAGNOSIS — C799 Secondary malignant neoplasm of unspecified site: Secondary | ICD-10-CM

## 2014-05-25 DIAGNOSIS — C50919 Malignant neoplasm of unspecified site of unspecified female breast: Secondary | ICD-10-CM | POA: Insufficient documentation

## 2014-05-25 DIAGNOSIS — F17200 Nicotine dependence, unspecified, uncomplicated: Secondary | ICD-10-CM | POA: Insufficient documentation

## 2014-05-25 DIAGNOSIS — J449 Chronic obstructive pulmonary disease, unspecified: Secondary | ICD-10-CM | POA: Insufficient documentation

## 2014-05-25 DIAGNOSIS — Z79899 Other long term (current) drug therapy: Secondary | ICD-10-CM | POA: Insufficient documentation

## 2014-05-25 DIAGNOSIS — R0789 Other chest pain: Principal | ICD-10-CM | POA: Insufficient documentation

## 2014-05-25 DIAGNOSIS — Z91199 Patient's noncompliance with other medical treatment and regimen due to unspecified reason: Secondary | ICD-10-CM | POA: Insufficient documentation

## 2014-05-25 DIAGNOSIS — R5381 Other malaise: Secondary | ICD-10-CM | POA: Insufficient documentation

## 2014-05-25 DIAGNOSIS — R531 Weakness: Secondary | ICD-10-CM

## 2014-05-25 DIAGNOSIS — C779 Secondary and unspecified malignant neoplasm of lymph node, unspecified: Secondary | ICD-10-CM

## 2014-05-25 DIAGNOSIS — R0602 Shortness of breath: Secondary | ICD-10-CM | POA: Insufficient documentation

## 2014-05-25 DIAGNOSIS — R110 Nausea: Secondary | ICD-10-CM | POA: Insufficient documentation

## 2014-05-25 DIAGNOSIS — I951 Orthostatic hypotension: Secondary | ICD-10-CM

## 2014-05-25 DIAGNOSIS — R079 Chest pain, unspecified: Secondary | ICD-10-CM | POA: Diagnosis present

## 2014-05-25 DIAGNOSIS — C3490 Malignant neoplasm of unspecified part of unspecified bronchus or lung: Secondary | ICD-10-CM | POA: Insufficient documentation

## 2014-05-25 DIAGNOSIS — J4489 Other specified chronic obstructive pulmonary disease: Secondary | ICD-10-CM | POA: Insufficient documentation

## 2014-05-25 DIAGNOSIS — Z9119 Patient's noncompliance with other medical treatment and regimen: Secondary | ICD-10-CM | POA: Insufficient documentation

## 2014-05-25 DIAGNOSIS — R5383 Other fatigue: Secondary | ICD-10-CM

## 2014-05-25 DIAGNOSIS — Z923 Personal history of irradiation: Secondary | ICD-10-CM | POA: Insufficient documentation

## 2014-05-25 DIAGNOSIS — C801 Malignant (primary) neoplasm, unspecified: Secondary | ICD-10-CM

## 2014-05-25 DIAGNOSIS — E876 Hypokalemia: Secondary | ICD-10-CM | POA: Insufficient documentation

## 2014-05-25 LAB — BASIC METABOLIC PANEL
BUN: 15 mg/dL (ref 6–23)
CALCIUM: 9.4 mg/dL (ref 8.4–10.5)
CHLORIDE: 99 meq/L (ref 96–112)
CO2: 23 meq/L (ref 19–32)
Creatinine, Ser: 0.71 mg/dL (ref 0.50–1.35)
GFR calc Af Amer: >90 mL/min (ref >90)
GFR calc non Af Amer: >90 mL/min (ref >90)
GLUCOSE: 99 mg/dL (ref 70–99)
Potassium: 3.7 mEq/L (ref 3.7–5.3)
Sodium: 136 mEq/L — ABNORMAL LOW (ref 137–147)

## 2014-05-25 LAB — CBC
HCT: 40.1 % (ref 39.0–52.0)
HEMOGLOBIN: 13.7 g/dL (ref 13.0–17.0)
MCH: 32.4 pg (ref 26.0–34.0)
MCHC: 34.2 g/dL (ref 30.0–36.0)
MCV: 94.8 fL (ref 78.0–100.0)
PLATELETS: 423 10*3/uL — AB (ref 150–400)
RBC: 4.23 MIL/uL (ref 4.22–5.81)
RDW: 12.3 % (ref 11.5–15.5)
WBC: 4.4 10*3/uL (ref 4.0–10.5)

## 2014-05-25 LAB — PRO B NATRIURETIC PEPTIDE: Pro B Natriuretic peptide (BNP): 109.9 pg/mL (ref 0–125)

## 2014-05-25 LAB — I-STAT TROPONIN, ED: TROPONIN I, POC: 0.01 ng/mL (ref 0.00–0.08)

## 2014-05-25 MED ORDER — OXYCODONE-ACETAMINOPHEN 5-325 MG PO TABS: 1.0000 | ORAL_TABLET | Freq: Four times a day (QID) | ORAL | Status: DC | PRN

## 2014-05-25 MED ORDER — IOHEXOL 350 MG/ML SOLN
100.0000 mL | Freq: Once | INTRAVENOUS | Status: AC | PRN
  Administered 2014-05-25: 100 mL via INTRAVENOUS

## 2014-05-25 MED ORDER — HYDROMORPHONE HCL PF 1 MG/ML IJ SOLN
1.0000 mg | INTRAMUSCULAR | Status: DC | PRN
  Administered 2014-05-25: 1 mg via INTRAVENOUS
  Filled 2014-05-25: qty 1

## 2014-05-25 MED ORDER — SODIUM CHLORIDE 0.9 % IV SOLN
INTRAVENOUS | Status: DC
  Administered 2014-05-25 – 2014-05-26 (×2): via INTRAVENOUS

## 2014-05-25 NOTE — H&P (Signed)
[facility]  History and Physical Scott L. Ardeth Perfect, MD Pager [phone] (if 7P to 7A, page night hospitalist on [email or web address] [email or web address] BJS:[phone] DOB: [birth year] DOA: 05/25/2014  Referring physician:ED  PCP: Barton Dubois, MD   Chief Complaint: chest pain , weakness   HPI:  Pt of Dr Bernette Redbird w/ known metastatic lung CA to the pericardium who presents w/ worsening CP. He had visits on 6/5 and 6/11 to discuss chemo planning and no showed both visits. At this point he completed 2 cycles of taxol/carboplatin w/ a staging CT on 6/3 that showed mild progression of disease, of which he was to discuss treatment for at the visits he missed  He has had severe L sided CP w/ slight nausea for weeks he said. Relieved w/ dilaudid so not currently present. No radiation. No emesis. No diaphoresis. Some Dypnea but has lung carcinoma and the pain is worse w/ deep breathing . CTA in ED negative for PE or PNA.   His vitals are stable and labs are relatively unremarkable. He will be admitted for ACS r/o, hydration b/c orthostatic in ED, and discussion of further planning for carcinoma w/ oncology   Chart Review:  Prior onc notes   Review of Systems:  As stated above. Otherwise neg   Past Medical History  Diagnosis Date  . Substance abuse     per H&P  . Lung nodules   . Lung nodules   . Gastric ulcer   . History of radiation therapy 11/26/13-12/15/13    30 gray to right lower posterior chest wall mass  . Metastatic adenocarcinoma 02/23/2014    Past Surgical History  Procedure Laterality Date  . Subxyphoid pericardial window N/A 10/30/2013    Procedure: SUBXYPHOID PERICARDIAL WINDOW;  Surgeon: Ivin Poot, MD;  Location: [facility];  Service: Thoracic;  Laterality: N/A;  . Video bronchoscopy N/A 10/30/2013    Procedure: VIDEO BRONCHOSCOPY;  Surgeon: Ivin Poot, MD;  Location: [facility];  Service: Thoracic;  Laterality: N/A;    Social History:  reports that he has been smoking  Cigarettes.  He has a 1.76 pack-year smoking history. He does not have any smokeless tobacco history on file. He reports that he drinks about 1.2 ounces of alcohol per week. He reports that he does not use illicit drugs.  Allergies  Allergen Reactions  . Benadryl [Diphenhydramine Hcl]     Pt states he just knows he's allergic    History reviewed. No pertinent family history.   Prior to Admission medications   Medication Sig Start Date End Date Taking? Authorizing Provider  acetaminophen (TYLENOL) 325 MG tablet Take 650 mg by mouth every 6 (six) hours as needed (pain).   Yes Historical Provider, MD  dexamethasone (DECADRON) 4 MG tablet Take 2.5 tabs (10 mg) at bedtime the night before first chemotherapy and at 6am the day of first chemotherapy. 03/03/14  Yes Ladell Pier, MD  Ipratropium-Albuterol (COMBIVENT) 20-100 MCG/ACT AERS respimat Inhale 1 puff into the lungs every 6 (six) hours as needed for wheezing or shortness of breath. 04/23/14  Yes Owens Shark, NP  ondansetron (ZOFRAN) 8 MG tablet Take 1 tablet (8 mg total) by mouth every 8 (eight) hours as needed for nausea or vomiting. 04/23/14  Yes Owens Shark, NP  oxyCODONE-acetaminophen (PERCOCET/ROXICET) 5-325 MG per tablet Take 1-2 tablets by mouth every 6 (six) hours as needed for severe pain. 05/25/14  Yes Ladell Pier, MD  prochlorperazine (COMPAZINE) 10 MG tablet Take 1  tablet (10 mg total) by mouth every 6 (six) hours as needed for nausea or vomiting. 03/25/14  Yes Owens Shark, NP   Physical Exam: Filed Vitals:   05/25/14 2121 05/25/14 2130 05/25/14 2145 05/25/14 2200  BP: 111/61     Pulse: 81 84 84 82  Temp:      TempSrc:      Resp:  19 15 15   SpO2:  97% 95% 97%     General:  AAM , cachectic , weak   Eyes: dry , anicteric   ENNT: no JVD   Cardiovascular:RRR, no MRG   Respiratory: CTAB, no wheezes, slight rales at bases   Abdomen: soft, NT, ND   Skin: warm, dry   Musculoskeletal: no focal deficits    Psychiatric: no anxiety, depression   Wt Readings from Last 3 Encounters:  04/23/14 61.009 kg (134 lb 8 oz)  03/25/14 62.097 kg (136 lb 14.4 oz)  02/23/14 62.778 kg (138 lb 6.4 oz)    Labs on Admission:  Basic Metabolic Panel:  Recent Labs Lab 05/25/14 2127  NA 136*  K 3.7  CL 99  CO2 23  GLUCOSE 99  BUN 15  CREATININE 0.71  CALCIUM 9.4    Liver Function Tests: No results found for this basename: AST, ALT, ALKPHOS, BILITOT, PROT, ALBUMIN,  in the last 168 hours No results found for this basename: LIPASE, AMYLASE,  in the last 168 hours No results found for this basename: AMMONIA,  in the last 168 hours  CBC:  Recent Labs Lab 05/25/14 2127  WBC 4.4  HGB 13.7  HCT 40.1  MCV 94.8  PLT 423*    Cardiac Enzymes: No results found for this basename: CKTOTAL, CKMB, CKMBINDEX, TROPONINI,  in the last 168 hours  Troponin (Point of Care Test)  Recent Labs  05/25/14 2138  TROPIPOC 0.01    BNP (last 3 results)  Recent Labs  05/25/14 2127  PROBNP 109.9    CBG: No results found for this basename: GLUCAP,  in the last 168 hours   Radiological Exams on Admission: Ct Angio Chest Pe W/cm &/or Wo Cm  05/25/2014   CLINICAL DATA:  Chest pain.  Shortness of breath.  Chest wall mass.  EXAM: CT ANGIOGRAPHY CHEST WITH CONTRAST  TECHNIQUE: Multidetector CT imaging of the chest was performed using the standard protocol during bolus administration of intravenous contrast. Multiplanar CT image reconstructions and MIPs were obtained to evaluate the vascular anatomy.  CONTRAST:  166mL OMNIPAQUE IOHEXOL 350 MG/ML SOLN  COMPARISON:  05/12/2014.  FINDINGS: Technically adequate study without pulmonary embolus. No acute aortic abnormality. Cardiomegaly is present. Lungs demonstrate bulky bilateral hilar adenopathy and mediastinal adenopathy, compatible with malignant adenopathy. Left upper lobe mass has increased slightly in size, measuring 33 mm AP by 20 mm transverse. Severe  emphysema is present. No airspace disease/pneumonia. Prominent dependent atelectasis.  The hilar adenopathy appears to of increased in the short interval since the prior exam, with right hilar node today measuring 26 mm, previously 23 mm. Some of this probably has a do with slice selection. Left hilar node appears similar. Radiodense object present in the left upper quadrant of the abdomen which potentially represents contents in the enteric stream.  Review of the MIP images confirms the above findings.  IMPRESSION: 1. No pulmonary embolus or acute aortic abnormality. 2. Mild progression of higher and mediastinal adenopathy along with left upper lobe pulmonary mass measuring 33 mm x 20 mm.   Electronically Signed   By:  Dereck Ligas M.D.   On: 05/25/2014 22:39   Dg Chest Port 1 View  05/25/2014   CLINICAL DATA:  58 year old male with chest pain shortness of breath fever cough and weakness. Initial encounter. Metastatic adenocarcinoma.  EXAM: PORTABLE CHEST - 1 VIEW  COMPARISON:  Chest CTA 05/12/2014.  FINDINGS: Portable AP upright view at 2054 hrs. Stable cardiac size and mediastinal contours. Visualized tracheal air column is within normal limits. No pneumothorax, pulmonary edema, or pleural effusion. There is increased streaky left lung base opacity which may be postobstructive in light of the recent CT.  IMPRESSION: Mildly increased streaky left lung base opacity, suspicious for postobstructive infection in light of the recent chest CT findings an the clinical presentation.   Electronically Signed   By: Lars Pinks M.D.   On: 05/25/2014 21:26      Active Problems:   Chest pain   Assessment/Plan 1. metatstatic adenoCA - unfortunately he has not been compliant w/ visits and disease seems to be progressing. Have placed patient on oncology rounding list  2. CP, atyical - likely 2/2 cardiac and pulmonary involvement of cancer but will r/o ACS w/ Trop and make NPO until neg. TTE in AM. Dilaudid and  percocet for pain control  3. orthostatis - hydrate w/ NS  4. Weakness - PT consult    Code Status: full, confirmed w/ family  Family Communication: present at bedside  Disposition Plan/Anticipated LOS: 2-3 days   Time spent: 3 minutes  Velna Hatchet, MD  Internal Medicine Pager [phone] If 7PM-7AM, please contact night-coverage at [email or web address], password [facility] 05/25/2014, 11:21 PM

## 2014-05-25 NOTE — Telephone Encounter (Signed)
Called to inform patient his prescription is ready to be picked up by 1630 today or tomorrow by 1630. Patient verbalized understanding and denies any questions or concerns at  this time.

## 2014-05-25 NOTE — Addendum Note (Signed)
Addended by: Tania Ade on: 05/25/2014 03:43 PM   Modules accepted: Orders

## 2014-05-25 NOTE — ED Notes (Signed)
Pt attempted to urinate x 2.  Unable to urinate at this time.

## 2014-05-25 NOTE — Telephone Encounter (Signed)
Being added to their service for case management due to medical high risk. He has been set up for follow up with Dr. Benay Spice on 06/14/14 at 0830. She had called patient to follow up on him from his ER visit on 05/19/14. He was not aware of tumor size progression-that nurse had attempted to reach him without success. She reports he is in a lot of pain and is asking if Dr. Benay Spice would refill his Percocet ? Last fill 04/23/14 #60.

## 2014-05-25 NOTE — Telephone Encounter (Signed)
pt called to sched appt...done....pt aware of appt

## 2014-05-25 NOTE — ED Notes (Signed)
Pt reports onset L chest pain and SOB last week, progressively worsened. Respirations labored, lung sounds diminished. Pt denies any trauma or recent falls.

## 2014-05-25 NOTE — ED Provider Notes (Signed)
CSN: [id number]     Arrival date & time 05/25/14  2007 History   First MD Initiated Contact with Patient 05/25/14 2017     Chief Complaint  Patient presents with  . Weakness  . Chest Pain    HPI Pt was seen at 2045. Per pt and his family, c/o gradual onset and worsening of persistent left sided chest "pain" for the past 1 week. Has been associated with cough, SOB and generalized weakness. Pain worsens with deep breath and coughing. Pt's family states pt is "too weak to get up." Pt was evaluated in the ED 6 days ago for same, but left AMA "because they didn't get me pain meds fast enough." Pt has significant hx of metastatic CA with LD chemo approximately 1 month ago, per pt and his family. Denies falling, no palpitations, no fevers, no rash, no abd pain, no N/V/D, no back pain.    Past Medical History  Diagnosis Date  . Substance abuse     per H&P  . Lung nodules   . Lung nodules   . Gastric ulcer   . History of radiation therapy 11/26/13-12/15/13    30 gray to right lower posterior chest wall mass  . Metastatic adenocarcinoma 02/23/2014   Past Surgical History  Procedure Laterality Date  . Subxyphoid pericardial window N/A 10/30/2013    Procedure: SUBXYPHOID PERICARDIAL WINDOW;  Surgeon: Ivin Poot, MD;  Location: [facility];  Service: Thoracic;  Laterality: N/A;  . Video bronchoscopy N/A 10/30/2013    Procedure: VIDEO BRONCHOSCOPY;  Surgeon: Ivin Poot, MD;  Location: [facility];  Service: Thoracic;  Laterality: N/A;    History  Substance Use Topics  . Smoking status: Current Every Day Smoker -- 0.04 packs/day for 44 years    Types: Cigarettes  . Smokeless tobacco: Not on file  . Alcohol Use: 1.2 oz/week    2 Cans of beer per week    Review of Systems ROS: Statement: All systems negative except as marked or noted in the HPI; Constitutional: Negative for fever and chills. +generalized weakness/fatigue. ; ; Eyes: Negative for eye pain, redness and discharge. ; ; ENMT: Negative  for ear pain, hoarseness, nasal congestion, sinus pressure and sore throat. ; ; Cardiovascular: +left sided CP. Negative for palpitations, diaphoresis, and peripheral edema. ; ; Respiratory: +SOB, cough. Negative for wheezing, stridor. ; ; Gastrointestinal: Negative for nausea, vomiting, diarrhea, abdominal pain, blood in stool, hematemesis, jaundice and rectal bleeding. . ; ; Genitourinary: Negative for dysuria, flank pain and hematuria. ; ; Musculoskeletal: Negative for back pain and neck pain. Negative for swelling and trauma.; ; Skin: Negative for pruritus, rash, abrasions, blisters, bruising and skin lesion.; ; Neuro: Negative for headache, lightheadedness and neck stiffness. Negative for altered level of consciousness , altered mental status, extremity weakness, paresthesias, involuntary movement, seizure and syncope.      Allergies  Benadryl  Home Medications   Prior to Admission medications   Medication Sig Start Date End Date Taking? Authorizing Havanna Groner  acetaminophen (TYLENOL) 325 MG tablet Take 650 mg by mouth every 6 (six) hours as needed (pain).   Yes Historical Kastiel Simonian, MD  dexamethasone (DECADRON) 4 MG tablet Take 2.5 tabs (10 mg) at bedtime the night before first chemotherapy and at 6am the day of first chemotherapy. 03/03/14  Yes Ladell Pier, MD  Ipratropium-Albuterol (COMBIVENT) 20-100 MCG/ACT AERS respimat Inhale 1 puff into the lungs every 6 (six) hours as needed for wheezing or shortness of breath. 04/23/14  Yes  Owens Shark, NP  ondansetron (ZOFRAN) 8 MG tablet Take 1 tablet (8 mg total) by mouth every 8 (eight) hours as needed for nausea or vomiting. 04/23/14  Yes Owens Shark, NP  oxyCODONE-acetaminophen (PERCOCET/ROXICET) 5-325 MG per tablet Take 1-2 tablets by mouth every 6 (six) hours as needed for severe pain. 05/25/14  Yes Ladell Pier, MD  prochlorperazine (COMPAZINE) 10 MG tablet Take 1 tablet (10 mg total) by mouth every 6 (six) hours as needed for nausea or  vomiting. 03/25/14  Yes Owens Shark, NP   BP 111/61  Pulse 82  Temp(Src) 98.9 F (37.2 C) (Oral)  Resp 15  SpO2 97% Physical Exam 2055: Physical examination:  Nursing notes reviewed; Vital signs and O2 SAT reviewed;  Constitutional: Thin, cachectic. In no acute distress; Head:  Normocephalic, atraumatic; Eyes: EOMI, PERRL, No scleral icterus; ENMT: Mouth and pharynx normal, Mucous membranes dry; Neck: Supple, Full range of motion, No lymphadenopathy; Cardiovascular: Regular rate and rhythm, No gallop; Respiratory: Breath sounds diminished & equal bilaterally, No wheezes.  Speaking full sentences with ease, Normal respiratory effort/excursion; Chest: Nontender, no deformity, no rash. Movement normal; Abdomen: Soft, Nontender, Nondistended, Normal bowel sounds; Genitourinary: No CVA tenderness; Extremities: Pulses normal, No tenderness, No edema, No calf edema or asymmetry.; Neuro: AA&Ox3, vague historian. Major CN grossly intact.  Speech clear. No gross focal motor or sensory deficits in extremities.; Skin: Color normal, Warm, Dry.   ED Course  Procedures     EKG Interpretation   Date/Time:  Tuesday May 25 2014 20:08:59 EDT Ventricular Rate:  92 PR Interval:  161 QRS Duration: 93 QT Interval:  363 QTC Calculation: 449 R Axis:   56 Text Interpretation:  Sinus rhythm Artifact RSR' in V1 or V2, probably  normal variant Nonspecific T abnrm, anterolateral leads When compared with  ECG of 05/19/2014 Nonspecific T wave abnormality Anterior leads is now  Present Confirmed by [facility]  MD, Nunzio Cory [phone]) on 05/25/2014 9:46:01  PM      MDM  MDM Reviewed: previous chart, nursing note and vitals Reviewed previous: labs and ECG Interpretation: labs, ECG, x-ray and CT scan    Results for orders placed during the hospital encounter of 05/25/14  CBC      Result Value Ref Range   WBC 4.4  4.0 - 10.5 K/uL   RBC 4.23  4.22 - 5.81 MIL/uL   Hemoglobin 13.7  13.0 - 17.0 g/dL   HCT 40.1   39.0 - 52.0 %   MCV 94.8  78.0 - 100.0 fL   MCH 32.4  26.0 - 34.0 pg   MCHC 34.2  30.0 - 36.0 g/dL   RDW 12.3  11.5 - 15.5 %   Platelets 423 (*) 150 - 400 K/uL  BASIC METABOLIC PANEL      Result Value Ref Range   Sodium 136 (*) 137 - 147 mEq/L   Potassium 3.7  3.7 - 5.3 mEq/L   Chloride 99  96 - 112 mEq/L   CO2 23  19 - 32 mEq/L   Glucose, Bld 99  70 - 99 mg/dL   BUN 15  6 - 23 mg/dL   Creatinine, Ser 0.71  0.50 - 1.35 mg/dL   Calcium 9.4  8.4 - 10.5 mg/dL   GFR calc non Af Amer >90  >90 mL/min   GFR calc Af Amer >90  >90 mL/min  PRO B NATRIURETIC PEPTIDE      Result Value Ref Range   Pro B Natriuretic peptide (  BNP) 109.9  0 - 125 pg/mL  I-STAT TROPOININ, ED      Result Value Ref Range   Troponin i, poc 0.01  0.00 - 0.08 ng/mL   Comment 3            Ct Angio Chest Pe W/cm &/or Wo Cm 05/25/2014   CLINICAL DATA:  Chest pain.  Shortness of breath.  Chest wall mass.  EXAM: CT ANGIOGRAPHY CHEST WITH CONTRAST  TECHNIQUE: Multidetector CT imaging of the chest was performed using the standard protocol during bolus administration of intravenous contrast. Multiplanar CT image reconstructions and MIPs were obtained to evaluate the vascular anatomy.  CONTRAST:  19mL OMNIPAQUE IOHEXOL 350 MG/ML SOLN  COMPARISON:  05/12/2014.  FINDINGS: Technically adequate study without pulmonary embolus. No acute aortic abnormality. Cardiomegaly is present. Lungs demonstrate bulky bilateral hilar adenopathy and mediastinal adenopathy, compatible with malignant adenopathy. Left upper lobe mass has increased slightly in size, measuring 33 mm AP by 20 mm transverse. Severe emphysema is present. No airspace disease/pneumonia. Prominent dependent atelectasis.  The hilar adenopathy appears to of increased in the short interval since the prior exam, with right hilar node today measuring 26 mm, previously 23 mm. Some of this probably has a do with slice selection. Left hilar node appears similar. Radiodense object present in  the left upper quadrant of the abdomen which potentially represents contents in the enteric stream.  Review of the MIP images confirms the above findings.  IMPRESSION: 1. No pulmonary embolus or acute aortic abnormality. 2. Mild progression of higher and mediastinal adenopathy along with left upper lobe pulmonary mass measuring 33 mm x 20 mm.   Electronically Signed   By: Dereck Ligas M.D.   On: 05/25/2014 22:39   Dg Chest Port 1 View 05/25/2014   CLINICAL DATA:  58 year old male with chest pain shortness of breath fever cough and weakness. Initial encounter. Metastatic adenocarcinoma.  EXAM: PORTABLE CHEST - 1 VIEW  COMPARISON:  Chest CTA 05/12/2014.  FINDINGS: Portable AP upright view at 2054 hrs. Stable cardiac size and mediastinal contours. Visualized tracheal air column is within normal limits. No pneumothorax, pulmonary edema, or pleural effusion. There is increased streaky left lung base opacity which may be postobstructive in light of the recent CT.  IMPRESSION: Mildly increased streaky left lung base opacity, suspicious for postobstructive infection in light of the recent chest CT findings an the clinical presentation.   Electronically Signed   By: Lars Pinks M.D.   On: 05/25/2014 21:26    2300:  Pt orthostatic on VS. Pt c/o generalized weakness and is unable to sit up on stretcher from laying down without significant assistance of family and ED staff. Continues to c/o left sided chest pain. Doubt PE as cause for symptoms with normal d-dimer and low risk Wells.  Doubt ACS as cause for symptoms with normal troponin and unchanged EKG from previous after 1 week of constant symptoms. Dx and testing d/w pt and family.  Questions answered.  Verb understanding, agreeable to admit. T/C to [facility] Dr. Ardeth Perfect, case discussed, including:  HPI, pertinent PM/SHx, VS/PE, dx testing, ED course and treatment:  Agreeable to admit, requests he will come to the ED for eval.    Alfonzo Feller, DO 05/27/14  1731

## 2014-05-25 NOTE — ED Notes (Signed)
Pt given urinal and is aware of need for a urine specimen

## 2014-05-26 ENCOUNTER — Other Ambulatory Visit: Payer: Self-pay

## 2014-05-26 DIAGNOSIS — C3490 Malignant neoplasm of unspecified part of unspecified bronchus or lung: Secondary | ICD-10-CM | POA: Diagnosis present

## 2014-05-26 DIAGNOSIS — I519 Heart disease, unspecified: Secondary | ICD-10-CM

## 2014-05-26 LAB — URINALYSIS, ROUTINE W REFLEX MICROSCOPIC
BILIRUBIN URINE: NEGATIVE
GLUCOSE, UA: NEGATIVE mg/dL
Hgb urine dipstick: NEGATIVE
KETONES UR: NEGATIVE mg/dL
Leukocytes, UA: NEGATIVE
Nitrite: NEGATIVE
Protein, ur: NEGATIVE mg/dL
Specific Gravity, Urine: >1.046 — ABNORMAL HIGH (ref 1.005–1.030)
Urobilinogen, UA: 0.2 mg/dL (ref 0.0–1.0)
pH: 6 (ref 5.0–8.0)

## 2014-05-26 LAB — BASIC METABOLIC PANEL
BUN: 11 mg/dL (ref 6–23)
CALCIUM: 9.1 mg/dL (ref 8.4–10.5)
CO2: 23 mEq/L (ref 19–32)
CREATININE: 0.62 mg/dL (ref 0.50–1.35)
Chloride: 101 mEq/L (ref 96–112)
GFR calc non Af Amer: >90 mL/min (ref >90)
Glucose, Bld: 98 mg/dL (ref 70–99)
POTASSIUM: 3.5 meq/L — AB (ref 3.7–5.3)
Sodium: 136 mEq/L — ABNORMAL LOW (ref 137–147)

## 2014-05-26 LAB — CBC
HCT: 40.6 % (ref 39.0–52.0)
Hemoglobin: 14 g/dL (ref 13.0–17.0)
MCH: 32.6 pg (ref 26.0–34.0)
MCHC: 34.5 g/dL (ref 30.0–36.0)
MCV: 94.4 fL (ref 78.0–100.0)
PLATELETS: 419 10*3/uL — AB (ref 150–400)
RBC: 4.3 MIL/uL (ref 4.22–5.81)
RDW: 12.4 % (ref 11.5–15.5)
WBC: 5.2 10*3/uL (ref 4.0–10.5)

## 2014-05-26 LAB — TROPONIN I
Troponin I: <0.3 ng/mL (ref <0.30)
Troponin I: <0.3 ng/mL (ref <0.30)

## 2014-05-26 MED ORDER — POTASSIUM CHLORIDE CRYS ER 20 MEQ PO TBCR
40.0000 meq | EXTENDED_RELEASE_TABLET | Freq: Once | ORAL | Status: AC
  Administered 2014-05-26: 40 meq via ORAL
  Filled 2014-05-26: qty 2

## 2014-05-26 MED ORDER — SODIUM CHLORIDE 0.9 % IJ SOLN
3.0000 mL | Freq: Two times a day (BID) | INTRAMUSCULAR | Status: DC
  Administered 2014-05-26: 3 mL via INTRAVENOUS

## 2014-05-26 MED ORDER — IPRATROPIUM-ALBUTEROL 0.5-2.5 (3) MG/3ML IN SOLN: 3.0000 mL | Freq: Four times a day (QID) | RESPIRATORY_TRACT | Status: DC | PRN

## 2014-05-26 MED ORDER — IBUPROFEN 600 MG PO TABS: 600.0000 mg | ORAL_TABLET | Freq: Three times a day (TID) | ORAL | Status: DC | PRN

## 2014-05-26 MED ORDER — ONDANSETRON HCL 4 MG PO TABS: 4.0000 mg | ORAL_TABLET | Freq: Four times a day (QID) | ORAL | Status: DC | PRN

## 2014-05-26 MED ORDER — OXYCODONE-ACETAMINOPHEN 5-325 MG PO TABS: 2.0000 | ORAL_TABLET | Freq: Four times a day (QID) | ORAL | Status: DC | PRN

## 2014-05-26 MED ORDER — ONDANSETRON HCL 4 MG/2ML IJ SOLN: 4.0000 mg | Freq: Four times a day (QID) | INTRAMUSCULAR | Status: DC | PRN

## 2014-05-26 MED ORDER — ENSURE COMPLETE PO LIQD: 237.0000 mL | Freq: Three times a day (TID) | ORAL | Status: DC

## 2014-05-26 MED ORDER — POLYETHYLENE GLYCOL 3350 17 G PO PACK
17.0000 g | PACK | Freq: Every day | ORAL | Status: DC | PRN
  Filled 2014-05-26: qty 1

## 2014-05-26 MED ORDER — HYDROCODONE-ACETAMINOPHEN 5-325 MG PO TABS: 1.0000 | ORAL_TABLET | ORAL | Status: DC | PRN

## 2014-05-26 MED ORDER — IPRATROPIUM-ALBUTEROL 20-100 MCG/ACT IN AERS: 1.0000 | INHALATION_SPRAY | Freq: Four times a day (QID) | RESPIRATORY_TRACT | Status: DC | PRN

## 2014-05-26 MED ORDER — HYDROMORPHONE HCL PF 1 MG/ML IJ SOLN
1.0000 mg | INTRAMUSCULAR | Status: DC | PRN
  Administered 2014-05-26: 1 mg via INTRAVENOUS
  Filled 2014-05-26: qty 1

## 2014-05-26 MED ORDER — ENOXAPARIN SODIUM 40 MG/0.4ML ~~LOC~~ SOLN
40.0000 mg | SUBCUTANEOUS | Status: DC
  Filled 2014-05-26: qty 0.4

## 2014-05-26 NOTE — Discharge Summary (Addendum)
Physician Discharge Summary  Philip Andrews FIE:[id number] DOB: 1956-11-04 DOA: 05/25/2014  PCP: No primary provider on file. Primary oncologist: Dr. Learta Codding  Admit date: 05/25/2014 Discharge date: 05/26/2014  Time spent: 25  minutes  Recommendations for Outpatient Follow-up:  Discharged home with outpatient followup with oncology. Has appointment on 06/14/2014  Discharge Diagnoses:  Principal problem   Chest pain, musculoskeletal   Metastatic adenocarcinoma of the lung COPD  Discharge Condition: Fair  Diet recommendation: Regular  Filed Weights   05/26/14 0018  Weight: 59.2 kg (130 lb 8.2 oz)    History of present illness:  Please refer to admission H&P for details, but in brief, 58 year old male with metastatic lung carcinoma with involvement of the pericardium presented with worsening chest pain that has been ongoing for 2 weeks. On review of the oncologists know if from one month back patient has been having off-and-on chest pains and was prescribed Percocet and NSAIDs for that. Patient reports having chest discomfort with deep inspiration. This chest pain is sharp and nonradiating. Patient reports shortness of breath on exertion and some nausea as well. Patient underwent CT angiogram of the chest in the ED was negative for PE as well as an an infiltrate. Patient's vitals in the ED were stable. He was checked for orthostasis in the ED which was negative. Since he appeared mildly dehydrated he was admitted to rule out for ACS and IV fluids.  Hospital Course:   Chest pain Likely musculoskeletal with underlying metastatic lung carcinoma. Patient's vitals are stable and oxygen saturation maintained on room air. Pain symptoms have resolved with Vicodin. Patient reports that he did not have pain medications and his oncologist office had according to the prescription which he never refilled. I will give him a prescription for Percocet 5/325 mg 2 tablets as needed every 6 hours. I will also  give him a prescription for Motrin 600 mg every 8 hours as needed for pain. Serial EKG and troponin have been negative. He had a 2-D echo done 7 months back which were normal EF and does not need to be repeated at this time. He is clinically stable to be discharged home with outpatient followup with his oncologist.  Metastatic adenocarcinoma of the lung Patient has received 2 cycles of chemotherapy with Taxol/carboplatin. He has been declining chemotherapy and was dizzy for cycle 3 on 5/20 which was a no-show. Patient has an appointment with the oncologist in 2 weeks. I have instructed him to keep the appointment.  COPD Continue home  Inhaler  Hypokalemia replenished   Procedures:  None  Consultations:  None  Discharge Exam: Filed Vitals:   05/26/14 0549  BP: 118/66  Pulse: 87  Temp: 98 F (36.7 C)  Resp: 20    General: Middle aged thin built male in no acute distress HEENT: No pallor, moist oral mucosa Chest: Clear to auscultation bilaterally, tender to pressure on the left side Cardiovascular: Normal S1 and S2, no murmurs rub or gallop Abdomen: Soft, nontender, nondistended, bowel sounds present Extremities: Warm, no edema CNS: AAO x3  Discharge Instructions You were cared for by a hospitalist during your hospital stay. If you have any questions about your discharge medications or the care you received while you were in the hospital after you are discharged, you can call the unit and asked to speak with the hospitalist on call if the hospitalist that took care of you is not available. Once you are discharged, your primary care physician will handle any further medical issues.  Please note that NO REFILLS for any discharge medications will be authorized once you are discharged, as it is imperative that you return to your primary care physician (or establish a relationship with a primary care physician if you do not have one) for your aftercare needs so that they can reassess  your need for medications and monitor your lab values.     Medication List         acetaminophen 325 MG tablet  Commonly known as:  TYLENOL  Take 650 mg by mouth every 6 (six) hours as needed (pain).     dexamethasone 4 MG tablet  Commonly known as:  DECADRON  Take 2.5 tabs (10 mg) at bedtime the night before first chemotherapy and at 6am the day of first chemotherapy.     ibuprofen 600 MG tablet  Commonly known as:  ADVIL,MOTRIN  Take 1 tablet (600 mg total) by mouth every 8 (eight) hours as needed.     Ipratropium-Albuterol 20-100 MCG/ACT Aers respimat  Commonly known as:  COMBIVENT  Inhale 1 puff into the lungs every 6 (six) hours as needed for wheezing or shortness of breath.     ondansetron 8 MG tablet  Commonly known as:  ZOFRAN  Take 1 tablet (8 mg total) by mouth every 8 (eight) hours as needed for nausea or vomiting.     oxyCODONE-acetaminophen 5-325 MG per tablet  Commonly known as:  PERCOCET/ROXICET  Take 2 tablets by mouth every 6 (six) hours as needed for severe pain.     prochlorperazine 10 MG tablet  Commonly known as:  COMPAZINE  Take 1 tablet (10 mg total) by mouth every 6 (six) hours as needed for nausea or vomiting.       Allergies  Allergen Reactions  . Benadryl [Diphenhydramine Hcl]     Pt states he just knows he's allergic       Follow-up Information   Follow up with Betsy Coder, MD On 06/14/2014.   Specialty:  Oncology   Contact information:   [place] [address] [phone]        The results of significant diagnostics from this hospitalization (including imaging, microbiology, ancillary and laboratory) are listed below for reference.    Significant Diagnostic Studies: Ct Chest W Contrast  05/12/2014   CLINICAL DATA:  Metastatic adenocarcinoma.  XRT and chemo complete.  EXAM: CT CHEST, ABDOMEN, AND PELVIS WITH CONTRAST  TECHNIQUE: Multidetector CT imaging of the chest, abdomen and pelvis was performed following  the standard protocol during bolus administration of intravenous contrast.  CONTRAST:  115mL OMNIPAQUE IOHEXOL 300 MG/ML  SOLN  COMPARISON:  02/18/2014  FINDINGS: CT CHEST FINDINGS  There is no pleural effusion identified. Scar is identified in the left base. Moderate changes of centrilobular emphysema identified. Left upper lobe pulmonary nodule measures 3.1 x 1.8 cm, image 27/ series 5. Previously 2.7 x 1.8 cm. 6 mm subpleural nodule along the fissure of the left lung is identified, image 39/ series 5. This is not significantly changed from previous exam.  Normal heart size. There is no pericardial effusion. Bilateral hilar and mediastinal adenopathy is again identified. Index left hilar lymph node measures 2.3 cm, image 40/ series 2. Previously 2 cm. AP window lymph node measures 2.5 x 2.9 cm, image 27/ series 2. Previously 3.4 x 2.9 cm. Right paratracheal lymph node measured 1.4 cm, image 29/ series 2. Previously 1.2 cm. The index right hilar node measures 2.3 cm, image 35/ series 2. No axillary  or supraclavicular adenopathy.  Review of the visualized bony structures shows no evidence for aggressive lytic or sclerotic bone lesion.  CT ABDOMEN AND PELVIS FINDINGS  Low attenuation structure within the posterior right hepatic lobe measures 1.1 cm, image 81/ series 2. Previously 1.3 cm. Gallbladder is normal. The pancreas is on unremarkable. Normal appearance of the spleen.  The adrenal glands are both normal. The kidneys are both on unremarkable. The urinary bladder is within normal limits. Prostate gland appears enlarged and has mass effect upon the bladder base.  There is calcified atherosclerotic disease involving the abdominal aorta. No aneurysm. No upper abdominal adenopathy identified. There is no pelvic or inguinal adenopathy noted.  The stomach is normal. The small bowel loops have a normal course and caliber. No obstruction. The appendix is visualized and appears normal. Normal appearance of the colon.   Review of the visualized bony shows no aggressive lytic or sclerotic bone lesions.  IMPRESSION: 1. Today's study demonstrates mild progression of disease with interval enlargement of the left upper lobe pulmonary nodule. Additionally, there has been enlargement of both hilar lymph nodes. 2. Stable nonspecific low attenuation structure within the posterior right hepatic lobe.   Electronically Signed   By: Kerby Moors M.D.   On: 05/12/2014 15:48   Ct Angio Chest Pe W/cm &/or Wo Cm  05/25/2014   CLINICAL DATA:  Chest pain.  Shortness of breath.  Chest wall mass.  EXAM: CT ANGIOGRAPHY CHEST WITH CONTRAST  TECHNIQUE: Multidetector CT imaging of the chest was performed using the standard protocol during bolus administration of intravenous contrast. Multiplanar CT image reconstructions and MIPs were obtained to evaluate the vascular anatomy.  CONTRAST:  182mL OMNIPAQUE IOHEXOL 350 MG/ML SOLN  COMPARISON:  05/12/2014.  FINDINGS: Technically adequate study without pulmonary embolus. No acute aortic abnormality. Cardiomegaly is present. Lungs demonstrate bulky bilateral hilar adenopathy and mediastinal adenopathy, compatible with malignant adenopathy. Left upper lobe mass has increased slightly in size, measuring 33 mm AP by 20 mm transverse. Severe emphysema is present. No airspace disease/pneumonia. Prominent dependent atelectasis.  The hilar adenopathy appears to of increased in the short interval since the prior exam, with right hilar node today measuring 26 mm, previously 23 mm. Some of this probably has a do with slice selection. Left hilar node appears similar. Radiodense object present in the left upper quadrant of the abdomen which potentially represents contents in the enteric stream.  Review of the MIP images confirms the above findings.  IMPRESSION: 1. No pulmonary embolus or acute aortic abnormality. 2. Mild progression of higher and mediastinal adenopathy along with left upper lobe pulmonary mass measuring  33 mm x 20 mm.   Electronically Signed   By: Dereck Ligas M.D.   On: 05/25/2014 22:39   Ct Abdomen Pelvis W Contrast  05/12/2014   CLINICAL DATA:  Metastatic adenocarcinoma.  XRT and chemo complete.  EXAM: CT CHEST, ABDOMEN, AND PELVIS WITH CONTRAST  TECHNIQUE: Multidetector CT imaging of the chest, abdomen and pelvis was performed following the standard protocol during bolus administration of intravenous contrast.  CONTRAST:  142mL OMNIPAQUE IOHEXOL 300 MG/ML  SOLN  COMPARISON:  02/18/2014  FINDINGS: CT CHEST FINDINGS  There is no pleural effusion identified. Scar is identified in the left base. Moderate changes of centrilobular emphysema identified. Left upper lobe pulmonary nodule measures 3.1 x 1.8 cm, image 27/ series 5. Previously 2.7 x 1.8 cm. 6 mm subpleural nodule along the fissure of the left lung is identified, image 39/ series 5.  This is not significantly changed from previous exam.  Normal heart size. There is no pericardial effusion. Bilateral hilar and mediastinal adenopathy is again identified. Index left hilar lymph node measures 2.3 cm, image 40/ series 2. Previously 2 cm. AP window lymph node measures 2.5 x 2.9 cm, image 27/ series 2. Previously 3.4 x 2.9 cm. Right paratracheal lymph node measured 1.4 cm, image 29/ series 2. Previously 1.2 cm. The index right hilar node measures 2.3 cm, image 35/ series 2. No axillary or supraclavicular adenopathy.  Review of the visualized bony structures shows no evidence for aggressive lytic or sclerotic bone lesion.  CT ABDOMEN AND PELVIS FINDINGS  Low attenuation structure within the posterior right hepatic lobe measures 1.1 cm, image 81/ series 2. Previously 1.3 cm. Gallbladder is normal. The pancreas is on unremarkable. Normal appearance of the spleen.  The adrenal glands are both normal. The kidneys are both on unremarkable. The urinary bladder is within normal limits. Prostate gland appears enlarged and has mass effect upon the bladder base.  There  is calcified atherosclerotic disease involving the abdominal aorta. No aneurysm. No upper abdominal adenopathy identified. There is no pelvic or inguinal adenopathy noted.  The stomach is normal. The small bowel loops have a normal course and caliber. No obstruction. The appendix is visualized and appears normal. Normal appearance of the colon.  Review of the visualized bony shows no aggressive lytic or sclerotic bone lesions.  IMPRESSION: 1. Today's study demonstrates mild progression of disease with interval enlargement of the left upper lobe pulmonary nodule. Additionally, there has been enlargement of both hilar lymph nodes. 2. Stable nonspecific low attenuation structure within the posterior right hepatic lobe.   Electronically Signed   By: Kerby Moors M.D.   On: 05/12/2014 15:48   Dg Chest Port 1 View  05/25/2014   CLINICAL DATA:  58 year old male with chest pain shortness of breath fever cough and weakness. Initial encounter. Metastatic adenocarcinoma.  EXAM: PORTABLE CHEST - 1 VIEW  COMPARISON:  Chest CTA 05/12/2014.  FINDINGS: Portable AP upright view at 2054 hrs. Stable cardiac size and mediastinal contours. Visualized tracheal air column is within normal limits. No pneumothorax, pulmonary edema, or pleural effusion. There is increased streaky left lung base opacity which may be postobstructive in light of the recent CT.  IMPRESSION: Mildly increased streaky left lung base opacity, suspicious for postobstructive infection in light of the recent chest CT findings an the clinical presentation.   Electronically Signed   By: Lars Pinks M.D.   On: 05/25/2014 21:26    Microbiology: No results found for this or any previous visit (from the past 240 hour(s)).   Labs: Basic Metabolic Panel:  Recent Labs Lab 05/25/14 2127 05/26/14 0630  NA 136* 136*  K 3.7 3.5*  CL 99 101  CO2 23 23  GLUCOSE 99 98  BUN 15 11  CREATININE 0.71 0.62  CALCIUM 9.4 9.1   Liver Function Tests: No results found  for this basename: AST, ALT, ALKPHOS, BILITOT, PROT, ALBUMIN,  in the last 168 hours No results found for this basename: LIPASE, AMYLASE,  in the last 168 hours No results found for this basename: AMMONIA,  in the last 168 hours CBC:  Recent Labs Lab 05/25/14 2127 05/26/14 0630  WBC 4.4 5.2  HGB 13.7 14.0  HCT 40.1 40.6  MCV 94.8 94.4  PLT 423* 419*   Cardiac Enzymes:  Recent Labs Lab 05/26/14 0100 05/26/14 0630  TROPONINI <0.30 <0.30   BNP: BNP (  last 3 results)  Recent Labs  05/25/14 2127  PROBNP 109.9   CBG: No results found for this basename: GLUCAP,  in the last 168 hours     Signed:  DHUNGEL, NISHANT  [facility] 05/26/2014, 11:20 AM

## 2014-05-26 NOTE — ED Notes (Signed)
Pt's son, Sable Feil: [phone]

## 2014-05-26 NOTE — Progress Notes (Signed)
PT Cancellation Note / Screen  Patient Details Name: Philip Andrews MRN: [id number] DOB: 04-13-1956   Cancelled Treatment:    Reason Eval/Treat Not Completed: PT screened, no needs identified, will sign off  Pt discharged and reports he has no PT needs at this time.  Pt on the phone with his son at time of visit and requested PT speak with son about physical therapy as son is his POA.  Son reports he will discuss further therapy with his father, recommended f/u with HHPT or outpatient per oncologist and/or PCP if pt notices decline in function, strength, balance.   LEMYRE,KATHrine E 05/26/2014, 12:50 PM Carmelia Bake, PT, DPT 05/26/2014 Pager: [phone]

## 2014-05-26 NOTE — Progress Notes (Signed)
  Echocardiogram 2D Echocardiogram has been performed.  Diamond Nickel 05/26/2014, 1:36 PM

## 2014-05-27 LAB — URINE CULTURE
COLONY COUNT: NO GROWTH
CULTURE: NO GROWTH

## 2014-06-02 ENCOUNTER — Inpatient Hospital Stay (HOSPITAL_COMMUNITY)
Admission: EM | Admit: 2014-06-02 | Discharge: 2014-06-17 | DRG: 981 | Disposition: A | Discharge status: Hospice | Payer: Medicaid Other | Attending: Internal Medicine | Admitting: Internal Medicine

## 2014-06-02 ENCOUNTER — Emergency Department (HOSPITAL_COMMUNITY): Payer: Medicaid Other

## 2014-06-02 ENCOUNTER — Encounter (HOSPITAL_COMMUNITY): Payer: Self-pay | Admitting: Emergency Medicine

## 2014-06-02 DIAGNOSIS — I3139 Other pericardial effusion (noninflammatory): Secondary | ICD-10-CM

## 2014-06-02 DIAGNOSIS — C3400 Malignant neoplasm of unspecified main bronchus: Secondary | ICD-10-CM

## 2014-06-02 DIAGNOSIS — I314 Cardiac tamponade: Secondary | ICD-10-CM | POA: Diagnosis present

## 2014-06-02 DIAGNOSIS — I313 Pericardial effusion (noninflammatory): Secondary | ICD-10-CM

## 2014-06-02 DIAGNOSIS — R079 Chest pain, unspecified: Secondary | ICD-10-CM | POA: Diagnosis present

## 2014-06-02 DIAGNOSIS — R748 Abnormal levels of other serum enzymes: Secondary | ICD-10-CM | POA: Diagnosis present

## 2014-06-02 DIAGNOSIS — J9601 Acute respiratory failure with hypoxia: Secondary | ICD-10-CM | POA: Diagnosis present

## 2014-06-02 DIAGNOSIS — Z791 Long term (current) use of non-steroidal anti-inflammatories (NSAID): Secondary | ICD-10-CM

## 2014-06-02 DIAGNOSIS — R599 Enlarged lymph nodes, unspecified: Secondary | ICD-10-CM | POA: Diagnosis present

## 2014-06-02 DIAGNOSIS — C3490 Malignant neoplasm of unspecified part of unspecified bronchus or lung: Secondary | ICD-10-CM | POA: Diagnosis present

## 2014-06-02 DIAGNOSIS — Z923 Personal history of irradiation: Secondary | ICD-10-CM

## 2014-06-02 DIAGNOSIS — D684 Acquired coagulation factor deficiency: Secondary | ICD-10-CM | POA: Diagnosis present

## 2014-06-02 DIAGNOSIS — E871 Hypo-osmolality and hyponatremia: Secondary | ICD-10-CM | POA: Diagnosis present

## 2014-06-02 DIAGNOSIS — D638 Anemia in other chronic diseases classified elsewhere: Secondary | ICD-10-CM | POA: Diagnosis present

## 2014-06-02 DIAGNOSIS — I319 Disease of pericardium, unspecified: Secondary | ICD-10-CM | POA: Diagnosis present

## 2014-06-02 DIAGNOSIS — C50919 Malignant neoplasm of unspecified site of unspecified female breast: Secondary | ICD-10-CM | POA: Diagnosis present

## 2014-06-02 DIAGNOSIS — J189 Pneumonia, unspecified organism: Secondary | ICD-10-CM | POA: Diagnosis present

## 2014-06-02 DIAGNOSIS — E236 Other disorders of pituitary gland: Secondary | ICD-10-CM | POA: Diagnosis present

## 2014-06-02 DIAGNOSIS — Z888 Allergy status to other drugs, medicaments and biological substances status: Secondary | ICD-10-CM

## 2014-06-02 DIAGNOSIS — C3492 Malignant neoplasm of unspecified part of left bronchus or lung: Secondary | ICD-10-CM

## 2014-06-02 DIAGNOSIS — C381 Malignant neoplasm of anterior mediastinum: Secondary | ICD-10-CM | POA: Diagnosis present

## 2014-06-02 DIAGNOSIS — R109 Unspecified abdominal pain: Secondary | ICD-10-CM | POA: Diagnosis present

## 2014-06-02 DIAGNOSIS — I743 Embolism and thrombosis of arteries of the lower extremities: Secondary | ICD-10-CM | POA: Diagnosis present

## 2014-06-02 DIAGNOSIS — I82401 Acute embolism and thrombosis of unspecified deep veins of right lower extremity: Secondary | ICD-10-CM

## 2014-06-02 DIAGNOSIS — I998 Other disorder of circulatory system: Secondary | ICD-10-CM | POA: Diagnosis present

## 2014-06-02 DIAGNOSIS — C779 Secondary and unspecified malignant neoplasm of lymph node, unspecified: Secondary | ICD-10-CM

## 2014-06-02 DIAGNOSIS — D6859 Other primary thrombophilia: Secondary | ICD-10-CM | POA: Diagnosis present

## 2014-06-02 DIAGNOSIS — N179 Acute kidney failure, unspecified: Secondary | ICD-10-CM

## 2014-06-02 DIAGNOSIS — F17200 Nicotine dependence, unspecified, uncomplicated: Secondary | ICD-10-CM | POA: Diagnosis present

## 2014-06-02 DIAGNOSIS — M79609 Pain in unspecified limb: Secondary | ICD-10-CM | POA: Diagnosis not present

## 2014-06-02 DIAGNOSIS — C799 Secondary malignant neoplasm of unspecified site: Secondary | ICD-10-CM

## 2014-06-02 DIAGNOSIS — R636 Underweight: Secondary | ICD-10-CM | POA: Diagnosis present

## 2014-06-02 DIAGNOSIS — N178 Other acute kidney failure: Secondary | ICD-10-CM

## 2014-06-02 DIAGNOSIS — I7092 Chronic total occlusion of artery of the extremities: Secondary | ICD-10-CM | POA: Diagnosis present

## 2014-06-02 DIAGNOSIS — D6869 Other thrombophilia: Secondary | ICD-10-CM | POA: Diagnosis present

## 2014-06-02 DIAGNOSIS — E43 Unspecified severe protein-calorie malnutrition: Secondary | ICD-10-CM | POA: Diagnosis present

## 2014-06-02 DIAGNOSIS — R0789 Other chest pain: Secondary | ICD-10-CM

## 2014-06-02 DIAGNOSIS — Z9119 Patient's noncompliance with other medical treatment and regimen: Secondary | ICD-10-CM

## 2014-06-02 DIAGNOSIS — R918 Other nonspecific abnormal finding of lung field: Secondary | ICD-10-CM

## 2014-06-02 DIAGNOSIS — Z91199 Patient's noncompliance with other medical treatment and regimen due to unspecified reason: Secondary | ICD-10-CM

## 2014-06-02 DIAGNOSIS — Z66 Do not resuscitate: Secondary | ICD-10-CM | POA: Diagnosis not present

## 2014-06-02 DIAGNOSIS — F1910 Other psychoactive substance abuse, uncomplicated: Secondary | ICD-10-CM

## 2014-06-02 DIAGNOSIS — J9600 Acute respiratory failure, unspecified whether with hypoxia or hypercapnia: Secondary | ICD-10-CM | POA: Diagnosis present

## 2014-06-02 DIAGNOSIS — Z79899 Other long term (current) drug therapy: Secondary | ICD-10-CM

## 2014-06-02 DIAGNOSIS — Z515 Encounter for palliative care: Secondary | ICD-10-CM

## 2014-06-02 DIAGNOSIS — R209 Unspecified disturbances of skin sensation: Secondary | ICD-10-CM | POA: Diagnosis not present

## 2014-06-02 LAB — I-STAT TROPONIN, ED: Troponin i, poc: 0 ng/mL (ref 0.00–0.08)

## 2014-06-02 LAB — CBC
HEMATOCRIT: 37.7 % — AB (ref 39.0–52.0)
HEMOGLOBIN: 13.1 g/dL (ref 13.0–17.0)
MCH: 31.9 pg (ref 26.0–34.0)
MCHC: 34.7 g/dL (ref 30.0–36.0)
MCV: 91.7 fL (ref 78.0–100.0)
Platelets: 417 10*3/uL — ABNORMAL HIGH (ref 150–400)
RBC: 4.11 MIL/uL — ABNORMAL LOW (ref 4.22–5.81)
RDW: 11.8 % (ref 11.5–15.5)
WBC: 5 10*3/uL (ref 4.0–10.5)

## 2014-06-02 LAB — BASIC METABOLIC PANEL
BUN: 14 mg/dL (ref 6–23)
CO2: 26 mEq/L (ref 19–32)
CREATININE: 0.66 mg/dL (ref 0.50–1.35)
Calcium: 9.2 mg/dL (ref 8.4–10.5)
Chloride: 96 mEq/L (ref 96–112)
GFR calc Af Amer: >90 mL/min (ref >90)
GLUCOSE: 87 mg/dL (ref 70–99)
Potassium: 3.7 mEq/L (ref 3.7–5.3)
Sodium: 136 mEq/L — ABNORMAL LOW (ref 137–147)

## 2014-06-02 LAB — PRO B NATRIURETIC PEPTIDE: Pro B Natriuretic peptide (BNP): 144.3 pg/mL — ABNORMAL HIGH (ref 0–125)

## 2014-06-02 MED ORDER — HYDROMORPHONE HCL PF 1 MG/ML IJ SOLN
1.0000 mg | Freq: Once | INTRAMUSCULAR | Status: AC
  Administered 2014-06-02: 1 mg via INTRAVENOUS
  Filled 2014-06-02: qty 1

## 2014-06-02 MED ORDER — DEXTROSE 5 % IV SOLN
1.0000 g | Freq: Once | INTRAVENOUS | Status: AC
  Administered 2014-06-03: 1 g via INTRAVENOUS
  Filled 2014-06-02: qty 10

## 2014-06-02 MED ORDER — DEXTROSE 5 % IV SOLN
500.0000 mg | INTRAVENOUS | Status: DC
  Administered 2014-06-03 – 2014-06-04 (×3): 500 mg via INTRAVENOUS
  Filled 2014-06-02 (×3): qty 500

## 2014-06-02 NOTE — ED Provider Notes (Signed)
CSN: [id number]     Arrival date & time 06/02/14  2144 History   First MD Initiated Contact with Patient 06/02/14 2230     Chief Complaint  Patient presents with  . Chest Pain  . Respiratory Distress     (Consider location/radiation/quality/duration/timing/severity/associated sxs/prior Treatment) Patient is a 58 y.o. male presenting with chest pain.  Chest Pain Pain location:  Substernal area Pain quality: sharp   Pain radiates to:  Does not radiate Pain severity:  Severe Onset quality:  Gradual Duration:  1 week Timing:  Constant Progression:  Worsening Chronicity:  Recurrent Context comment:  Hx of cancer.  Admitted to hospital last week secondary to chest pain, which he reports is the same pain. Relieved by:  Nothing Worsened by:  Coughing, deep breathing and movement Ineffective treatments:  None tried Associated symptoms: cough, nausea and shortness of breath   Associated symptoms: no abdominal pain, no fever and not vomiting   Cough:    Cough characteristics:  Productive   Sputum characteristics:  Nondescript   Past Medical History  Diagnosis Date  . Substance abuse     per H&P  . Lung nodules   . Lung nodules   . Gastric ulcer   . History of radiation therapy 11/26/13-12/15/13    30 gray to right lower posterior chest wall mass  . Metastatic adenocarcinoma 02/23/2014   Past Surgical History  Procedure Laterality Date  . Subxyphoid pericardial window N/A 10/30/2013    Procedure: SUBXYPHOID PERICARDIAL WINDOW;  Surgeon: Ivin Poot, MD;  Location: [place];  Service: Thoracic;  Laterality: N/A;  . Video bronchoscopy N/A 10/30/2013    Procedure: VIDEO BRONCHOSCOPY;  Surgeon: Ivin Poot, MD;  Location: [place];  Service: Thoracic;  Laterality: N/A;   No family history on file. History  Substance Use Topics  . Smoking status: Current Every Day Smoker -- 0.04 packs/day for 44 years    Types: Cigarettes  . Smokeless tobacco: Not on file  . Alcohol Use: 1.2  oz/week    2 Cans of beer per week    Review of Systems  Constitutional: Negative for fever.  Respiratory: Positive for cough and shortness of breath.   Cardiovascular: Positive for chest pain.  Gastrointestinal: Positive for nausea. Negative for vomiting, abdominal pain and diarrhea.  All other systems reviewed and are negative.     Allergies  Benadryl  Home Medications   Prior to Admission medications   Medication Sig Start Date End Date Taking? Authorizing Provider  dexamethasone (DECADRON) 4 MG tablet Take 2.5 tabs (10 mg) at bedtime the night before first chemotherapy and at 6am the day of first chemotherapy. 03/03/14   Ladell Pier, MD  ibuprofen (ADVIL,MOTRIN) 600 MG tablet Take 1 tablet (600 mg total) by mouth every 8 (eight) hours as needed. 05/26/14   Nishant Dhungel, MD  Ipratropium-Albuterol (COMBIVENT) 20-100 MCG/ACT AERS respimat Inhale 1 puff into the lungs every 6 (six) hours as needed for wheezing or shortness of breath. 04/23/14   Owens Shark, NP  ondansetron (ZOFRAN) 8 MG tablet Take 1 tablet (8 mg total) by mouth every 8 (eight) hours as needed for nausea or vomiting. 04/23/14   Owens Shark, NP  oxyCODONE-acetaminophen (PERCOCET/ROXICET) 5-325 MG per tablet Take 2 tablets by mouth every 6 (six) hours as needed for severe pain. 05/26/14   Nishant Dhungel, MD  prochlorperazine (COMPAZINE) 10 MG tablet Take 1 tablet (10 mg total) by mouth every 6 (six) hours as needed for nausea  or vomiting. 03/25/14   Owens Shark, NP   BP 107/67  Pulse 114  Temp(Src) 98.3 F (36.8 C) (Axillary)  Resp 42  Ht 5\' 9"  (1.753 m)  Wt 130 lb (58.968 kg)  BMI 19.19 kg/m2  SpO2 98% Physical Exam  Nursing note and vitals reviewed. Constitutional: He is oriented to person, place, and time. He appears well-developed and well-nourished. No distress.  HENT:  Head: Normocephalic and atraumatic.  Mouth/Throat: Oropharynx is clear and moist.  Eyes: Conjunctivae are normal. Pupils are  equal, round, and reactive to light. No scleral icterus.  Neck: Neck supple.  Cardiovascular: Normal rate, regular rhythm, normal heart sounds and intact distal pulses.   No murmur heard. Pulmonary/Chest: Breath sounds normal. No stridor. Tachypnea (shallow respirations) noted. No respiratory distress. He has no wheezes. He has no rales.  Abdominal: Soft. He exhibits no distension. There is no tenderness.  Musculoskeletal: Normal range of motion. He exhibits no edema.  Neurological: He is alert and oriented to person, place, and time.  Skin: Skin is warm and dry. No rash noted.  Psychiatric: He has a normal mood and affect. His behavior is normal.    ED Course  Procedures (including critical care time) Labs Review Labs Reviewed  CBC - Abnormal; Notable for the following:    RBC 4.11 (*)    HCT 37.7 (*)    Platelets 417 (*)    All other components within normal limits  BASIC METABOLIC PANEL - Abnormal; Notable for the following:    Sodium 136 (*)    All other components within normal limits  PRO B NATRIURETIC PEPTIDE - Abnormal; Notable for the following:    Pro B Natriuretic peptide (BNP) 144.3 (*)    All other components within normal limits  I-STAT TROPOININ, ED    Imaging Review Ct Chest Wo Contrast  06/03/2014   CLINICAL DATA:  Pneumonia versus progressive lung cancer.  EXAM: CT CHEST WITHOUT CONTRAST  TECHNIQUE: Multidetector CT imaging of the chest was performed following the standard protocol without IV contrast.  COMPARISON:  05/25/2014  FINDINGS: Examination is technically limited for evaluation of hilar and mediastinal structures without IV contrast material.  Cardiac enlargement. Normal caliber thoracic aorta. Probable vascular congestion. Bilateral hilar and probable mediastinal lymphadenopathy, better visualized on previous contrast enhanced study. Airways remain patent although there is evidence of mucous in distal trachea and mild narrowing of the left lower lobe  bronchus probably due to extrinsic compression. Left suprahilar mass lesion again demonstrated, measuring 3.2 bone 1.9 cm, similar to prior study. Diffuse emphysematous changes in the lungs. Increasing atelectasis or consolidation in the lung bases. No pneumothorax or effusion. Visualized portions of the upper abdominal organs are grossly unremarkable.  IMPRESSION: No definitive change in appearance of left suprahilar mass and bilateral hilar and mediastinal lymphadenopathy although evaluation is limited without contrast material. There appears to be increasing infiltration or atelectasis in the lung bases. Diffuse emphysematous changes.   Electronically Signed   By: Lucienne Capers M.D.   On: 06/03/2014 02:47   Dg Chest Port 1 View  06/02/2014   CLINICAL DATA:  Left-sided chest and arm pain for 1 week. Wrist prior rotatory distress.  EXAM: PORTABLE CHEST - 1 VIEW  COMPARISON:  05/25/2014  FINDINGS: Heart size is normal. Normal pulmonary vascularity. Increasing perihilar mass or infiltration bilaterally. This corresponds to perihilar lymphadenopathy noted on previous CT chest 05/25/2014. Stable interstitial changes in the lungs with suggestion of focal increasing opacity in the left lung  base. Developing pneumonia is not excluded. No blunting of costophrenic angles. No pneumothorax.  IMPRESSION: Increasing bilateral perihilar mass or infiltration with suggestion of focal developing infiltration in the left lung base.   Electronically Signed   By: Lucienne Capers M.D.   On: 06/02/2014 22:36  All radiology studies independently viewed by me.      EKG Interpretation   Date/Time:  Wednesday June 02 2014 21:54:18 EDT Ventricular Rate:  114 PR Interval:  176 QRS Duration: 89 QT Interval:  342 QTC Calculation: 471 R Axis:   59 Text Interpretation:  Sinus tachycardia Biatrial enlargement Probable left  ventricular hypertrophy Since last tracing rate faster Confirmed by KNAPP   MD-I, IVA ([id number]) on  06/02/2014 10:29:08 PM      MDM   Final diagnoses:  Community acquired pneumonia    58 yo male with hx of metastatic adenocarcinoma of presumed lung origin presenting with chest pain and SOB.  He was admitted for similar about a week ago after having a CT A PE study which was negative for PE.    Patient appears to have a poor understanding of his medical condition.  Regardless, his CXR was worrisome for developing pneumonia.  He appears to have missed his last chemotherapy per chart review and he is unable to corroborate any information about his treatment.  Plan to treat for CAP.    CT obtained to better define CXR findings.  It showed increasing opacity vs atelectasis.  Pt's pain better after dilaudid.  Plan admission to internal medicine.  Internal Medicine requested transfer to [facility] for better management of his cancer.    Houston Siren III, MD 06/03/14 [phone]

## 2014-06-02 NOTE — ED Notes (Signed)
Pt arrives via EMS from. C/o chest pain in left upper chest and SOB. States a hx of stage 4 lung ca that has spread to his back. Currently on chemo. 10/10 pain. EMS gave 324 ASA and 1 NTG with no relief. EKG unremarkable. 93/55 after NTG, HR 110-120. Pt 98% on non-rebreather.

## 2014-06-02 NOTE — ED Notes (Signed)
Spoke with pts son on the phone - update given.

## 2014-06-03 ENCOUNTER — Encounter (HOSPITAL_COMMUNITY): Payer: Self-pay | Admitting: Radiology

## 2014-06-03 ENCOUNTER — Emergency Department (HOSPITAL_COMMUNITY): Payer: Medicaid Other

## 2014-06-03 DIAGNOSIS — C3490 Malignant neoplasm of unspecified part of unspecified bronchus or lung: Secondary | ICD-10-CM | POA: Diagnosis present

## 2014-06-03 DIAGNOSIS — R0789 Other chest pain: Secondary | ICD-10-CM

## 2014-06-03 DIAGNOSIS — J9600 Acute respiratory failure, unspecified whether with hypoxia or hypercapnia: Secondary | ICD-10-CM

## 2014-06-03 DIAGNOSIS — E43 Unspecified severe protein-calorie malnutrition: Secondary | ICD-10-CM

## 2014-06-03 MED ORDER — IPRATROPIUM-ALBUTEROL 0.5-2.5 (3) MG/3ML IN SOLN: 3.0000 mL | Freq: Four times a day (QID) | RESPIRATORY_TRACT | Status: DC | PRN

## 2014-06-03 MED ORDER — HYDROMORPHONE HCL PF 1 MG/ML IJ SOLN
0.5000 mg | INTRAMUSCULAR | Status: DC | PRN
  Administered 2014-06-03 – 2014-06-05 (×7): 1 mg via INTRAVENOUS
  Filled 2014-06-03 (×8): qty 1

## 2014-06-03 MED ORDER — HEPARIN SODIUM (PORCINE) 5000 UNIT/ML IJ SOLN
5000.0000 [IU] | Freq: Three times a day (TID) | INTRAMUSCULAR | Status: DC
  Administered 2014-06-03 – 2014-06-04 (×4): 5000 [IU] via SUBCUTANEOUS
  Filled 2014-06-03 (×7): qty 1

## 2014-06-03 MED ORDER — SODIUM CHLORIDE 0.9 % IJ SOLN
3.0000 mL | Freq: Two times a day (BID) | INTRAMUSCULAR | Status: DC
  Administered 2014-06-03 – 2014-06-10 (×15): 3 mL via INTRAVENOUS

## 2014-06-03 MED ORDER — ENSURE COMPLETE PO LIQD
237.0000 mL | Freq: Three times a day (TID) | ORAL | Status: DC
  Administered 2014-06-03 – 2014-06-11 (×24): 237 mL via ORAL

## 2014-06-03 MED ORDER — DEXTROSE 5 % IV SOLN
1.0000 g | INTRAVENOUS | Status: DC
  Administered 2014-06-04 – 2014-06-09 (×7): 1 g via INTRAVENOUS
  Filled 2014-06-03 (×7): qty 10

## 2014-06-03 MED ORDER — ONDANSETRON HCL 4 MG PO TABS: 8.0000 mg | ORAL_TABLET | Freq: Three times a day (TID) | ORAL | Status: DC | PRN

## 2014-06-03 MED ORDER — IPRATROPIUM-ALBUTEROL 20-100 MCG/ACT IN AERS: 1.0000 | INHALATION_SPRAY | Freq: Four times a day (QID) | RESPIRATORY_TRACT | Status: DC | PRN

## 2014-06-03 MED ORDER — IBUPROFEN 600 MG PO TABS
600.0000 mg | ORAL_TABLET | Freq: Three times a day (TID) | ORAL | Status: DC | PRN
  Filled 2014-06-03: qty 1

## 2014-06-03 NOTE — Progress Notes (Addendum)
Patient ID: Philip Andrews, male   DOB: 07-Dec-1956, 58 y.o.   MRN: [id number] [facility] PROGRESS NOTE  Philip Andrews CZY:[id number] DOB: September 20, 1956 DOA: 06/02/2014 PCP: No PCP Per Patient  Brief narrative: Addendum to admission note done 06/03/2014 58 y.o. 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 CT chest done on this admission showed no changes in left suprahilar mass and bilateral hilar and mediastinal lymphadenopathy. There was however worsening infiltration in lung bases. CXR showed increasing bilateral perihilar mass or infiltration with suggestion of focal developing infiltration in the left lung base.  Assessment/Plan:  Principal Problem: Acute respiratory failure with hypoxia  Secondary to lung cancer and community acquired pneumonia  CXR and CT chest suggestive of infiltrates in lung bases.  Started azithromycin and rocephin  Follow up blood cultures  Oxygen support via [place] to keep O2 saturation above 90%  DVT prophylaxis:heparin sub Q while pt is in hospital   Code Status: full code  Family Communication: plan of care discussed with the patient Disposition Plan: home when stable   Leisa Lenz, MD  [facility] Pager [phone]  If 7PM-7AM, please contact night-coverage [email or web address] Password [email or web address] 06/03/2014, 2:39 PM   LOS: 1 day   Consultants:  None   Procedures:  None   Antibiotics:  Azithromycin 06/02/2014 -->  Rocephin 06/03/2014 -->  HPI/Subjective: No acute overnight events.  Objective: Filed Vitals:   06/03/14 0430 06/03/14 0448 06/03/14 0545 06/03/14 1426  BP: 117/75  117/74 104/60  Pulse: 89  90 98  Temp:  98.8 F (37.1 C) 98.1 F (36.7 C) 98.7 F (37.1 C)  TempSrc:  Oral Oral Oral  Resp: 35  26 22  Height:   5\' 9"  (1.753 m)   Weight:   56.6 kg (124 lb 12.5 oz)   SpO2: 98%   99% 92%    Intake/Output Summary (Last 24 hours) at 06/03/14 1439 Last data filed at 06/03/14 1300  Gross per 24 hour  Intake    360 ml  Output      0 ml  Net    360 ml    Exam:   General:  Pt is alert, no acute distress  Cardiovascular: Regular rate and rhythm, S1/S2, no murmurs  Respiratory: Rhonchi diffusely, wheezing in upper lung lobes  Abdomen: Soft, non tender, non distended, bowel sounds present  Extremities: No edema, pulses DP and PT palpable bilaterally  Neuro: Grossly nonfocal  Data Reviewed: Basic Metabolic Panel:  Recent Labs Lab 06/02/14 2211  NA 136*  K 3.7  CL 96  CO2 26  GLUCOSE 87  BUN 14  CREATININE 0.66  CALCIUM 9.2   Liver Function Tests: No results found for this basename: AST, ALT, ALKPHOS, BILITOT, PROT, ALBUMIN,  in the last 168 hours No results found for this basename: LIPASE, AMYLASE,  in the last 168 hours No results found for this basename: AMMONIA,  in the last 168 hours CBC:  Recent Labs Lab 06/02/14 2211  WBC 5.0  HGB 13.1  HCT 37.7*  MCV 91.7  PLT 417*   Cardiac Enzymes: No results found for this basename: CKTOTAL, CKMB, CKMBINDEX, TROPONINI,  in the last 168 hours BNP: No components found with this basename: POCBNP,  CBG: No results found for this basename: GLUCAP,  in the last 168 hours  Recent Results (from the past 240 hour(s))  URINE CULTURE     Status: None   Collection Time    05/26/14  3:51 AM      Result Value Ref Range Status   Specimen Description URINE, RANDOM   Final   Special Requests NONE   Final   Culture  Setup Time     Final   Value: 05/26/2014 08:40     Performed at [place]     Final   Value: NO GROWTH     Performed at [facility]   Culture     Final   Value: NO GROWTH     Performed at [facility]   Report Status 05/27/2014 FINAL   Final     Studies: Ct Chest Wo Contrast 06/03/2014    IMPRESSION: No definitive change in appearance of  left suprahilar mass and bilateral hilar and mediastinal lymphadenopathy although evaluation is limited without contrast material. There appears to be increasing infiltration or atelectasis in the lung bases. Diffuse emphysematous changes.   Electronically Signed   By: Lucienne Capers M.D.   On: 06/03/2014 02:47   Dg Chest Port 1 View 06/02/2014   IMPRESSION: Increasing bilateral perihilar mass or infiltration with suggestion of focal developing infiltration in the left lung base.   Electronically Signed   By: Lucienne Capers M.D.   On: 06/02/2014 22:36    Scheduled Meds: . azithromycin  500 mg Intravenous Q24H  . feeding supplement (ENSURE COMPLETE)  237 mL Oral TID WC  . heparin  5,000 Units Subcutaneous 3 times per day  . sodium chloride  3 mL Intravenous Q12H   Continuous Infusions:

## 2014-06-03 NOTE — Progress Notes (Signed)
Patient says that his son Jlon Betker is his [place]. If anyone has questions regarding medications or previous medical hx contact [facility].  Lenise Herald

## 2014-06-03 NOTE — Progress Notes (Signed)
INITIAL NUTRITION ASSESSMENT  DOCUMENTATION CODES Per approved criteria  -Severe malnutrition in the context of chronic illness -Underweight  Pt meets criteria for severe MALNUTRITION in the context of chronic illness as evidenced by PO intake <75% for one month, severe muscle wasting and subcutaneous fat loss, 7.4% body weight loss in one month.   INTERVENTION: -Recommend Ensure Complete po TID, each supplement provides 350 kcal and 13 grams of protein' -Will provide 8PM nourishment -Encouraged intake of high kcal/protein foods -Discussed nutrition therapy for nausea -Will continue to monitor  NUTRITION DIAGNOSIS: Inadequate oral intake related to nausea/abd pain/vomiting as evidenced by PO intake <75%, 7.4% body weight loss in one month.   Goal: Pt to meet >/= 90% of their estimated nutrition needs    Monitor:  Total protein/energy intake, labs, weight, diet education needs, GI profile  Reason for Assessment: MST/Underweight BMI  58 y.o. male  Admitting Dx: Lung cancer  ASSESSMENT: Philip Andrews is a 58 y.o. male with known metastatic adenocarcinoma of the lug with mets to pericardium who presents to the ED with chest pain  -Pt reported ongoing nausea, vomiting, abd pain for > 2 weeks. Has been unable to tolerate solid foods, consuming 3 Ensure Complete daily. -Encouraged intake of soft high kcal/protein foods- peanut butter, whey powder to mix in w/milk and/or supplements, ice cream, puddings. Pt in agreement to consume milkshake as 8PM nourishment for nutrient replenishment -Current PO intake is 50%, receiving assistance for meal ordering -Discussed intake of bland foods and gingerale to assist with nausea -Provided supplement coupons -Endorsed an unintentional wt loss of 42 lbs since 10/2013. -Experienced 10 lbs weight loss in last month, or 7.4% body weight loss in one month, which is severe for time frame -Pt with evident signs of severe muscle wasting and subcutaneous  fat loss in temporal, clavicle and acromion regions  Height: Ht Readings from Last 1 Encounters:  06/03/14 5\' 9"  (1.753 m)    Weight: Wt Readings from Last 1 Encounters:  06/03/14 124 lb 12.5 oz (56.6 kg)    Ideal Body Weight: 160 lbs  % Ideal Body Weight: 78%  Wt Readings from Last 10 Encounters:  06/03/14 124 lb 12.5 oz (56.6 kg)  05/26/14 130 lb 8.2 oz (59.2 kg)  04/23/14 134 lb 8 oz (61.009 kg)  03/25/14 136 lb 14.4 oz (62.097 kg)  02/23/14 138 lb 6.4 oz (62.778 kg)  02/03/14 147 lb 4.8 oz (66.815 kg)  01/21/14 140 lb 4.8 oz (63.64 kg)  12/15/13 136 lb 4.8 oz (61.825 kg)  12/01/13 138 lb 9.6 oz (62.869 kg)  11/25/13 132 lb 6.4 oz (60.056 kg)    Usual Body Weight: 166 lbs in 10/2013  % Usual Body Weight: 75%  BMI:  Body mass index is 18.42 kg/(m^2). Underweight  Estimated Nutritional Needs: Kcal: 1900-2100 Protein: 85-100 gram Fluid: >/= 1900 ml/daily  Skin: WDL  Diet Order: General  EDUCATION NEEDS: -Education needs addressed   Intake/Output Summary (Last 24 hours) at 06/03/14 1148 Last data filed at 06/03/14 0900  Gross per 24 hour  Intake    120 ml  Output      0 ml  Net    120 ml    Last BM: pta   Labs:   Recent Labs Lab 06/02/14 2211  NA 136*  K 3.7  CL 96  CO2 26  BUN 14  CREATININE 0.66  CALCIUM 9.2  GLUCOSE 87    CBG (last 3)  No results found for this basename:  GLUCAP,  in the last 72 hours  Scheduled Meds: . azithromycin  500 mg Intravenous Q24H  . feeding supplement (ENSURE COMPLETE)  237 mL Oral TID WC  . heparin  5,000 Units Subcutaneous 3 times per day  . sodium chloride  3 mL Intravenous Q12H    Continuous Infusions:   Past Medical History  Diagnosis Date  . Substance abuse     per H&P  . Lung nodules   . Lung nodules   . Gastric ulcer   . History of radiation therapy 11/26/13-12/15/13    30 gray to right lower posterior chest wall mass  . Metastatic adenocarcinoma 02/23/2014    Past Surgical History   Procedure Laterality Date  . Subxyphoid pericardial window N/A 10/30/2013    Procedure: SUBXYPHOID PERICARDIAL WINDOW;  Surgeon: Ivin Poot, MD;  Location: [place];  Service: Thoracic;  Laterality: N/A;  . Video bronchoscopy N/A 10/30/2013    Procedure: VIDEO BRONCHOSCOPY;  Surgeon: Ivin Poot, MD;  Location: [place];  Service: Thoracic;  Laterality: N/A;    Atlee Abide MS RD LDN Clinical Dietitian [phone]

## 2014-06-03 NOTE — H&P (Addendum)
[facility] History and Physical  STPEHEN PETITJEAN NFA:[id number] DOB: 25-Jul-1956 DOA: 06/02/2014  Referring physician: EDP PCP: No PCP Per Patient   Chief Complaint: Chest pain   HPI: Philip Andrews is a 58 y.o. 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  Chest pain is worse with deep inspiration.  Has been worsening over the past 1 week since his discharge from the hospital.  Unfortunately patient has very poor insight into his disease process and when prompted about lung cancer the furthest he goes is "yeah they said something about that I might have it".  ACS rule out during last admission was negative.  After that admission he went home, continued to be on no treatment for cancer, and unsurprisingly continued to do worse.  Review of Systems: Systems reviewed.  As above, otherwise negative  Past Medical History  Diagnosis Date  . Substance abuse     per H&P  . Lung nodules   . Lung nodules   . Gastric ulcer   . History of radiation therapy 11/26/13-12/15/13    30 gray to right lower posterior chest wall mass  . Metastatic adenocarcinoma 02/23/2014   Past Surgical History  Procedure Laterality Date  . Subxyphoid pericardial window N/A 10/30/2013    Procedure: SUBXYPHOID PERICARDIAL WINDOW;  Surgeon: Ivin Poot, MD;  Location: [place];  Service: Thoracic;  Laterality: N/A;  . Video bronchoscopy N/A 10/30/2013    Procedure: VIDEO BRONCHOSCOPY;  Surgeon: Ivin Poot, MD;  Location: [place];  Service: Thoracic;  Laterality: N/A;   Social History:  reports that he has been smoking Cigarettes.  He has a 1.76 pack-year smoking history. He does not have any smokeless tobacco history on file. He reports that he drinks about 1.2 ounces of alcohol per week. He reports that he does not use illicit drugs.  Allergies  Allergen Reactions  . Benadryl [Diphenhydramine Hcl]     Pt states he just knows he's allergic     No family history on file.   Prior to Admission medications   Medication Sig Start Date End Date Taking? Authorizing Provider  dexamethasone (DECADRON) 4 MG tablet Take 2.5 tabs (10 mg) at bedtime the night before first chemotherapy and at 6am the day of first chemotherapy. 03/03/14   Ladell Pier, MD  ibuprofen (ADVIL,MOTRIN) 600 MG tablet Take 1 tablet (600 mg total) by mouth every 8 (eight) hours as needed. 05/26/14   Nishant Dhungel, MD  Ipratropium-Albuterol (COMBIVENT) 20-100 MCG/ACT AERS respimat Inhale 1 puff into the lungs every 6 (six) hours as needed for wheezing or shortness of breath. 04/23/14   Owens Shark, NP  ondansetron (ZOFRAN) 8 MG tablet Take 1 tablet (8 mg total) by mouth every 8 (eight) hours as needed for nausea or vomiting. 04/23/14   Owens Shark, NP  oxyCODONE-acetaminophen (PERCOCET/ROXICET) 5-325 MG per tablet Take 2 tablets by mouth every 6 (six) hours as needed for severe pain. 05/26/14   Nishant Dhungel, MD  prochlorperazine (COMPAZINE) 10 MG tablet Take 1 tablet (10 mg total) by mouth every 6 (six) hours as needed for nausea or vomiting. 03/25/14   Owens Shark, NP   Physical Exam: Filed Vitals:   06/03/14 0200  BP: 117/66  Pulse: 93  Temp:   Resp: 21    BP 117/66  Pulse 93  Temp(Src) 98.3 F (36.8 C) (Axillary)  Resp 21  Ht 5\' 9"  (  1.753 m)  Wt 58.968 kg (130 lb)  BMI 19.19 kg/m2  SpO2 94%  General Appearance:    Alert, oriented, no distress, appears stated age, cachexia  Head:    Normocephalic, atraumatic  Eyes:    PERRL, EOMI, sclera non-icteric        Nose:   Nares without drainage or epistaxis. Mucosa, turbinates normal  Throat:   Moist mucous membranes. Oropharynx without erythema or exudate.  Neck:   Supple. No carotid bruits.  No thyromegaly.  No lymphadenopathy.   Back:     No CVA tenderness, no spinal tenderness  Lungs:     Clear to auscultation bilaterally, without wheezes, rhonchi or rales  Chest wall:    No tenderness to  palpitation  Heart:    Regular rate and rhythm without murmurs, gallops, rubs  Abdomen:     Soft, non-tender, nondistended, normal bowel sounds, no organomegaly  Genitalia:    deferred  Rectal:    deferred  Extremities:   No clubbing, cyanosis or edema.  Pulses:   2+ and symmetric all extremities  Skin:   Skin color, texture, turgor normal, no rashes or lesions  Lymph nodes:   Cervical, supraclavicular, and axillary nodes normal  Neurologic:   CNII-XII intact. Normal strength, sensation and reflexes      throughout    Labs on Admission:  Basic Metabolic Panel:  Recent Labs Lab 06/02/14 2211  NA 136*  K 3.7  CL 96  CO2 26  GLUCOSE 87  BUN 14  CREATININE 0.66  CALCIUM 9.2   Liver Function Tests: No results found for this basename: AST, ALT, ALKPHOS, BILITOT, PROT, ALBUMIN,  in the last 168 hours No results found for this basename: LIPASE, AMYLASE,  in the last 168 hours No results found for this basename: AMMONIA,  in the last 168 hours CBC:  Recent Labs Lab 06/02/14 2211  WBC 5.0  HGB 13.1  HCT 37.7*  MCV 91.7  PLT 417*   Cardiac Enzymes: No results found for this basename: CKTOTAL, CKMB, CKMBINDEX, TROPONINI,  in the last 168 hours  BNP (last 3 results)  Recent Labs  05/25/14 2127 06/02/14 2211  PROBNP 109.9 144.3*   CBG: No results found for this basename: GLUCAP,  in the last 168 hours  Radiological Exams on Admission: Ct Chest Wo Contrast  06/03/2014   CLINICAL DATA:  Pneumonia versus progressive lung cancer.  EXAM: CT CHEST WITHOUT CONTRAST  TECHNIQUE: Multidetector CT imaging of the chest was performed following the standard protocol without IV contrast.  COMPARISON:  05/25/2014  FINDINGS: Examination is technically limited for evaluation of hilar and mediastinal structures without IV contrast material.  Cardiac enlargement. Normal caliber thoracic aorta. Probable vascular congestion. Bilateral hilar and probable mediastinal lymphadenopathy, better  visualized on previous contrast enhanced study. Airways remain patent although there is evidence of mucous in distal trachea and mild narrowing of the left lower lobe bronchus probably due to extrinsic compression. Left suprahilar mass lesion again demonstrated, measuring 3.2 bone 1.9 cm, similar to prior study. Diffuse emphysematous changes in the lungs. Increasing atelectasis or consolidation in the lung bases. No pneumothorax or effusion. Visualized portions of the upper abdominal organs are grossly unremarkable.  IMPRESSION: No definitive change in appearance of left suprahilar mass and bilateral hilar and mediastinal lymphadenopathy although evaluation is limited without contrast material. There appears to be increasing infiltration or atelectasis in the lung bases. Diffuse emphysematous changes.   Electronically Signed   By: Oren Beckmann.D.  On: 06/03/2014 02:47   Dg Chest Port 1 View  06/02/2014   CLINICAL DATA:  Left-sided chest and arm pain for 1 week. Wrist prior rotatory distress.  EXAM: PORTABLE CHEST - 1 VIEW  COMPARISON:  05/25/2014  FINDINGS: Heart size is normal. Normal pulmonary vascularity. Increasing perihilar mass or infiltration bilaterally. This corresponds to perihilar lymphadenopathy noted on previous CT chest 05/25/2014. Stable interstitial changes in the lungs with suggestion of focal increasing opacity in the left lung base. Developing pneumonia is not excluded. No blunting of costophrenic angles. No pneumothorax.  IMPRESSION: Increasing bilateral perihilar mass or infiltration with suggestion of focal developing infiltration in the left lung base.   Electronically Signed   By: Lucienne Capers M.D.   On: 06/02/2014 22:36    EKG: Independently reviewed.  Assessment/Plan Principal Problem:   Lung cancer Active Problems:   Chest pain   1. Chest pain from lung cancer - also appears to have worsening atelectasis vs infiltrate, im more suspicious of atelectasis given  complete lack of SIRS criteria or PNA symptoms, and the fact that his chest pain is very pleuretic (which could lead to atelectasis).  Over all though, needs to see oncology sooner rather than later and given that he missed 2 outpatient appointments with oncology, feel that it would just be best if he saw them tomorrow in the hospital (since we have him here now).  Therefore will transfer him to [facility] and put in routine consult into [facility] and note to call oncology in AM to see patient. 2. Did get ABX x1 here in the ED, however will hold off on further ABX until oncology can evaluate patient since I am not convinced entirely that he has PNA.    Code Status: Full Code  Family Communication: No family in room Disposition Plan: Admit to obs   Time spent: 30 min  GARDNER, JARED M. [facility] Pager [phone]  If 7AM-7PM, please contact the day team taking care of the patient [email or web address] Password [facility] 06/03/2014, 3:17 AM

## 2014-06-03 NOTE — Progress Notes (Signed)
Utilization review completed.  

## 2014-06-04 ENCOUNTER — Ambulatory Visit
Admit: 2014-06-04 | Discharge: 2014-06-04 | Disposition: A | Discharge status: Home / Self Care | Payer: Medicaid Other | Attending: Radiation Oncology | Admitting: Radiation Oncology

## 2014-06-04 ENCOUNTER — Other Ambulatory Visit: Payer: Self-pay | Admitting: *Deleted

## 2014-06-04 DIAGNOSIS — D6859 Other primary thrombophilia: Secondary | ICD-10-CM | POA: Diagnosis present

## 2014-06-04 DIAGNOSIS — I824Y9 Acute embolism and thrombosis of unspecified deep veins of unspecified proximal lower extremity: Secondary | ICD-10-CM | POA: Diagnosis not present

## 2014-06-04 DIAGNOSIS — I314 Cardiac tamponade: Secondary | ICD-10-CM | POA: Diagnosis present

## 2014-06-04 DIAGNOSIS — R599 Enlarged lymph nodes, unspecified: Secondary | ICD-10-CM | POA: Diagnosis present

## 2014-06-04 DIAGNOSIS — C50919 Malignant neoplasm of unspecified site of unspecified female breast: Secondary | ICD-10-CM | POA: Diagnosis present

## 2014-06-04 DIAGNOSIS — R209 Unspecified disturbances of skin sensation: Secondary | ICD-10-CM | POA: Diagnosis not present

## 2014-06-04 DIAGNOSIS — C801 Malignant (primary) neoplasm, unspecified: Secondary | ICD-10-CM

## 2014-06-04 DIAGNOSIS — D638 Anemia in other chronic diseases classified elsewhere: Secondary | ICD-10-CM | POA: Diagnosis present

## 2014-06-04 DIAGNOSIS — R918 Other nonspecific abnormal finding of lung field: Secondary | ICD-10-CM

## 2014-06-04 DIAGNOSIS — M79609 Pain in unspecified limb: Secondary | ICD-10-CM | POA: Diagnosis not present

## 2014-06-04 DIAGNOSIS — C781 Secondary malignant neoplasm of mediastinum: Secondary | ICD-10-CM | POA: Insufficient documentation

## 2014-06-04 DIAGNOSIS — C3490 Malignant neoplasm of unspecified part of unspecified bronchus or lung: Secondary | ICD-10-CM | POA: Diagnosis present

## 2014-06-04 DIAGNOSIS — Z888 Allergy status to other drugs, medicaments and biological substances status: Secondary | ICD-10-CM | POA: Diagnosis not present

## 2014-06-04 DIAGNOSIS — F17200 Nicotine dependence, unspecified, uncomplicated: Secondary | ICD-10-CM | POA: Diagnosis present

## 2014-06-04 DIAGNOSIS — R079 Chest pain, unspecified: Secondary | ICD-10-CM | POA: Diagnosis present

## 2014-06-04 DIAGNOSIS — R636 Underweight: Secondary | ICD-10-CM | POA: Diagnosis present

## 2014-06-04 DIAGNOSIS — R748 Abnormal levels of other serum enzymes: Secondary | ICD-10-CM | POA: Diagnosis present

## 2014-06-04 DIAGNOSIS — D649 Anemia, unspecified: Secondary | ICD-10-CM

## 2014-06-04 DIAGNOSIS — Z510 Encounter for antineoplastic radiation therapy: Secondary | ICD-10-CM

## 2014-06-04 DIAGNOSIS — C381 Malignant neoplasm of anterior mediastinum: Secondary | ICD-10-CM | POA: Diagnosis present

## 2014-06-04 DIAGNOSIS — I998 Other disorder of circulatory system: Secondary | ICD-10-CM | POA: Diagnosis present

## 2014-06-04 DIAGNOSIS — Z91199 Patient's noncompliance with other medical treatment and regimen due to unspecified reason: Secondary | ICD-10-CM | POA: Diagnosis not present

## 2014-06-04 DIAGNOSIS — Z515 Encounter for palliative care: Secondary | ICD-10-CM | POA: Diagnosis not present

## 2014-06-04 DIAGNOSIS — J9600 Acute respiratory failure, unspecified whether with hypoxia or hypercapnia: Secondary | ICD-10-CM | POA: Diagnosis present

## 2014-06-04 DIAGNOSIS — I319 Disease of pericardium, unspecified: Secondary | ICD-10-CM | POA: Diagnosis present

## 2014-06-04 DIAGNOSIS — M549 Dorsalgia, unspecified: Secondary | ICD-10-CM

## 2014-06-04 DIAGNOSIS — Z923 Personal history of irradiation: Secondary | ICD-10-CM | POA: Diagnosis not present

## 2014-06-04 DIAGNOSIS — I7092 Chronic total occlusion of artery of the extremities: Secondary | ICD-10-CM | POA: Diagnosis present

## 2014-06-04 DIAGNOSIS — E236 Other disorders of pituitary gland: Secondary | ICD-10-CM | POA: Diagnosis present

## 2014-06-04 DIAGNOSIS — E43 Unspecified severe protein-calorie malnutrition: Secondary | ICD-10-CM | POA: Diagnosis present

## 2014-06-04 DIAGNOSIS — Z791 Long term (current) use of non-steroidal anti-inflammatories (NSAID): Secondary | ICD-10-CM | POA: Diagnosis not present

## 2014-06-04 DIAGNOSIS — J189 Pneumonia, unspecified organism: Secondary | ICD-10-CM | POA: Diagnosis present

## 2014-06-04 DIAGNOSIS — Z79899 Other long term (current) drug therapy: Secondary | ICD-10-CM | POA: Diagnosis not present

## 2014-06-04 DIAGNOSIS — I743 Embolism and thrombosis of arteries of the lower extremities: Secondary | ICD-10-CM | POA: Diagnosis present

## 2014-06-04 DIAGNOSIS — R109 Unspecified abdominal pain: Secondary | ICD-10-CM | POA: Diagnosis present

## 2014-06-04 DIAGNOSIS — C779 Secondary and unspecified malignant neoplasm of lymph node, unspecified: Secondary | ICD-10-CM

## 2014-06-04 DIAGNOSIS — D684 Acquired coagulation factor deficiency: Secondary | ICD-10-CM | POA: Diagnosis present

## 2014-06-04 DIAGNOSIS — Z66 Do not resuscitate: Secondary | ICD-10-CM | POA: Diagnosis not present

## 2014-06-04 MED ORDER — OXYCODONE-ACETAMINOPHEN 5-325 MG PO TABS
1.0000 | ORAL_TABLET | ORAL | Status: DC | PRN
  Administered 2014-06-04 – 2014-06-08 (×3): 2 via ORAL
  Administered 2014-06-08: 1 via ORAL
  Administered 2014-06-09 (×2): 2 via ORAL
  Administered 2014-06-09: 1 via ORAL
  Administered 2014-06-09 – 2014-06-11 (×4): 2 via ORAL
  Filled 2014-06-04 (×3): qty 2
  Filled 2014-06-04: qty 1
  Filled 2014-06-04 (×8): qty 2

## 2014-06-04 NOTE — Progress Notes (Signed)
Patient ID: Philip Andrews, male   DOB: June 10, 1956, 58 y.o.   MRN: [id number] [facility] PROGRESS NOTE  IHAN PAT DXA:[id number] DOB: 12-Jun-1956 DOA: 06/02/2014 PCP: No PCP Per Patient  Brief narrative: 58 y.o. 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 CT chest done on this admission showed no changes in left suprahilar mass and bilateral hilar and mediastinal lymphadenopathy. There was however worsening infiltration in lung bases. CXR showed increasing bilateral perihilar mass or infiltration with suggestion of focal developing infiltration in the left lung base.   Assessment/Plan:   Principal Problem:  Acute respiratory failure with hypoxia  Secondary to lung cancer and community acquired pneumonia  CXR and CT chest suggestive of infiltrates in lung bases.  Continue azithromycin and rocephin for now and follow up results of blood cultures  Oxygen support via  to keep O2 saturation above 90% Active Problems: Metastatic lung cancer  Per oncology possible palliative RT  hospice referral    DVT prophylaxis:heparin sub Q while pt is in hospital   Code Status: full code  Family Communication: plan of care discussed with the patient  Disposition Plan: remains in patient    Consultants:  Oncology (Dr. Benay Spice) Procedures:  None  Antibiotics:  Azithromycin 06/02/2014 -->  Rocephin 06/03/2014 -->    Leisa Lenz, MD  [facility] Pager [phone]  If 7PM-7AM, please contact night-coverage [email or web address] Password [facility] 06/04/2014, 12:33 PM   LOS: 2 days    HPI/Subjective: No acute overnight events.  Objective: Filed Vitals:   06/03/14 0545 06/03/14 1426 06/03/14 2053 06/04/14 0532  BP: 117/74 104/60 96/62 108/65  Pulse: 90 98 95 95  Temp: 98.1 F (36.7 C) 98.7 F (37.1 C) 98.2 F (36.8 C) 98.3 F (36.8  C)  TempSrc: Oral Oral Oral Oral  Resp: 26 22 20 20   Height: 5\' 9"  (1.753 m)     Weight: 56.6 kg (124 lb 12.5 oz)     SpO2: 99% 92% 97% 93%    Intake/Output Summary (Last 24 hours) at 06/04/14 1233 Last data filed at 06/04/14 1014  Gross per 24 hour  Intake    720 ml  Output      0 ml  Net    720 ml    Exam:   General:  Pt is alert, follows commands appropriately, not in acute distress  Cardiovascular: Regular rate and rhythm, S1/S2, no murmurs  Respiratory: Congested, rhonchi in upper and mid lung lobes  Abdomen: Soft, non tender, non distended, bowel sounds present  Extremities: No edema, pulses DP and PT palpable bilaterally  Neuro: Grossly nonfocal  Data Reviewed: Basic Metabolic Panel:  Recent Labs Lab 06/02/14 2211  NA 136*  K 3.7  CL 96  CO2 26  GLUCOSE 87  BUN 14  CREATININE 0.66  CALCIUM 9.2   Liver Function Tests: No results found for this basename: AST, ALT, ALKPHOS, BILITOT, PROT, ALBUMIN,  in the last 168 hours No results found for this basename: LIPASE, AMYLASE,  in the last 168 hours No results found for this basename: AMMONIA,  in the last 168 hours CBC:  Recent Labs Lab 06/02/14 2211  WBC 5.0  HGB 13.1  HCT 37.7*  MCV 91.7  PLT 417*   Cardiac Enzymes: No results found for this basename: CKTOTAL, CKMB, CKMBINDEX, TROPONINI,  in the last 168 hours BNP: No  components found with this basename: POCBNP,  CBG: No results found for this basename: GLUCAP,  in the last 168 hours  Recent Results (from the past 240 hour(s))  URINE CULTURE     Status: None   Collection Time    05/26/14  3:51 AM      Result Value Ref Range Status   Specimen Description URINE, RANDOM   Final   Special Requests NONE   Final   Culture  Setup Time     Final   Value: 05/26/2014 08:40     Performed at [place]     Final   Value: NO GROWTH     Performed at [facility]   Culture     Final   Value: NO GROWTH      Performed at [facility]   Report Status 05/27/2014 FINAL   Final  CULTURE, BLOOD (ROUTINE X 2)     Status: None   Collection Time    06/03/14 12:28 AM      Result Value Ref Range Status   Specimen Description BLOOD RIGHT ARM   Final   Special Requests BOTTLES DRAWN AEROBIC AND ANAEROBIC 10CC EACH   Final   Culture  Setup Time     Final   Value: 06/03/2014 09:24     Performed at [facility]   Culture     Final   Value:        BLOOD CULTURE RECEIVED NO GROWTH TO DATE CULTURE WILL BE HELD FOR 5 DAYS BEFORE ISSUING A FINAL NEGATIVE REPORT     Performed at [facility]   Report Status PENDING   Incomplete  CULTURE, BLOOD (ROUTINE X 2)     Status: None   Collection Time    06/03/14 12:38 AM      Result Value Ref Range Status   Specimen Description BLOOD RIGHT HAND   Final   Special Requests BOTTLES DRAWN AEROBIC AND ANAEROBIC 6CC   Final   Culture  Setup Time     Final   Value: 06/03/2014 09:25     Performed at [facility]   Culture     Final   Value:        BLOOD CULTURE RECEIVED NO GROWTH TO DATE CULTURE WILL BE HELD FOR 5 DAYS BEFORE ISSUING A FINAL NEGATIVE REPORT     Performed at [facility]   Report Status PENDING   Incomplete     Studies: Ct Chest Wo Contrast 06/03/2014    IMPRESSION: No definitive change in appearance of left suprahilar mass and bilateral hilar and mediastinal lymphadenopathy although evaluation is limited without contrast material. There appears to be increasing infiltration or atelectasis in the lung bases. Diffuse emphysematous changes.   Electronically Signed   By: Lucienne Capers M.D.   On: 06/03/2014 02:47   Dg Chest Port 1 View 06/02/2014     IMPRESSION: Increasing bilateral perihilar mass or infiltration with suggestion of focal developing infiltration in the left lung base.   Electronically Signed   By: Lucienne Capers M.D.   On: 06/02/2014 22:36    Scheduled Meds: . azithromycin  500 mg Intravenous Q24H   . cefTRIAXone (ROCEPHIN)  IV  1 g Intravenous Q24H  . feeding supplement (ENSURE COMPLETE)  237 mL Oral TID WC  . heparin  5,000 Units Subcutaneous 3 times per day  . sodium chloride  3 mL Intravenous Q12H   Continuous Infusions:

## 2014-06-04 NOTE — Consult Note (Signed)
Radiation Oncology         [phone] ________________________________  Name: Philip Andrews MRN: [id number]  Date: 06/02/2014  DOB: 1956-03-10  INPATIENT  DIAGNOSIS: The primary encounter diagnosis was Community acquired pneumonia. Diagnoses of Acute respiratory failure with hypoxia, Other chest pain, Metastatic primary lung cancer, unspecified laterality, and Protein-calorie malnutrition, severe were also pertinent to this visit.   HISTORY OF PRESENT ILLNESS::Philip Andrews is a 58 y.o. male who is seen for an initial consultation visit. The patient has a history of metastatic adenocarcinoma. He has previously received palliative radiation to a soft tissue metastasis in the right back which was completed on 12/15/2013. He has been followed by Dr. Benay Spice in medical oncology and the patient has received palliative chemotherapy. Recent CT imaging has noted some progression within the chest including both parenchymal disease and lymphadenopathy.  The patient has been hospitalized multiple times with the complaint of left anterior chest pain. Review of the patient's imaging does demonstrate a candidate target lesion contributing to this representing a anterior soft tissue nodule just posterior to the chest wall just to the left of midline. The patient today complains of severe pain and states that he is very interested in proceeding with palliative radiation treatment to this area. The patient states that he had a very good response with good relief of pain of the prior site. At that time he received 30 gray in 10 fractions. He is very interested in trying to achieve a similar result for his left anterior chest pain.   PREVIOUS RADIATION THERAPY: Yes as above completed 30 gray in 10 fractions to the right posterior back/flank completed in January of 2015   PAST MEDICAL HISTORY:  has a past medical history of Substance abuse; Lung nodules; Lung nodules; Gastric ulcer; History of radiation therapy  (11/26/13-12/15/13); and Metastatic adenocarcinoma (02/23/2014).     PAST SURGICAL HISTORY: Past Surgical History  Procedure Laterality Date  . Subxyphoid pericardial window N/A 10/30/2013    Procedure: SUBXYPHOID PERICARDIAL WINDOW;  Surgeon: Ivin Poot, MD;  Location: [place];  Service: Thoracic;  Laterality: N/A;  . Video bronchoscopy N/A 10/30/2013    Procedure: VIDEO BRONCHOSCOPY;  Surgeon: Ivin Poot, MD;  Location: [facility];  Service: Thoracic;  Laterality: N/A;     FAMILY HISTORY: family history is not on file.   SOCIAL HISTORY:  reports that he has been smoking Cigarettes.  He has a 1.76 pack-year smoking history. He does not have any smokeless tobacco history on file. He reports that he drinks about 1.2 ounces of alcohol per week. He reports that he does not use illicit drugs.   ALLERGIES: Benadryl   MEDICATIONS:  Current Facility-Administered Medications  Medication Dose Route Frequency Provider Last Rate Last Dose  . azithromycin (ZITHROMAX) 500 mg in dextrose 5 % 250 mL IVPB  500 mg Intravenous Q24H Robbie Lis, MD 250 mL/hr at 06/03/14 2141 500 mg at 06/03/14 2141  . cefTRIAXone (ROCEPHIN) 1 g in dextrose 5 % 50 mL IVPB  1 g Intravenous Q24H Robbie Lis, MD   1 g at 06/04/14 0030  . feeding supplement (ENSURE COMPLETE) (ENSURE COMPLETE) liquid 237 mL  237 mL Oral TID WC Hazle Coca, RD   237 mL at 06/04/14 1159  . HYDROmorphone (DILAUDID) injection 0.5-1 mg  0.5-1 mg Intravenous Q4H PRN Etta Quill, DO   1 mg at 06/04/14 1340  . ibuprofen (ADVIL,MOTRIN) tablet 600 mg  600 mg Oral Q8H PRN Kevan Ny  Nestor Lewandowsky, DO      . ipratropium-albuterol (DUONEB) 0.5-2.5 (3) MG/3ML nebulizer solution 3 mL  3 mL Nebulization Q6H PRN Etta Quill, DO      . ondansetron [facility]) tablet 8 mg  8 mg Oral Q8H PRN Etta Quill, DO      . oxyCODONE-acetaminophen (PERCOCET/ROXICET) 5-325 MG per tablet 1-2 tablet  1-2 tablet Oral Q4H PRN Ladell Pier, MD   2 tablet at  06/04/14 1159  . sodium chloride 0.9 % injection 3 mL  3 mL Intravenous Q12H Etta Quill, DO   3 mL at 06/04/14 5732     REVIEW OF SYSTEMS:  A 15 point review of systems is documented in the electronic medical record. This was obtained by the nursing staff. However, I reviewed this with the patient to discuss relevant findings and make appropriate changes.  Pertinent items are noted in HPI.    PHYSICAL EXAM:  height is 5\' 9"  (1.753 m) and weight is 124 lb 12.5 oz (56.6 kg). His oral temperature is 98.3 F (36.8 C). His blood pressure is 109/66 and his pulse is 88. His respiration is 16 and oxygen saturation is 92%.   ECOG = 2  0 - Asymptomatic (Fully active, able to carry on all predisease activities without restriction)  1 - Symptomatic but completely ambulatory (Restricted in physically strenuous activity but ambulatory and able to carry out work of a light or sedentary nature. For example, light housework, office work)  2 - Symptomatic, <50% in bed during the day (Ambulatory and capable of all self care but unable to carry out any work activities. Up and about more than 50% of waking hours)  3 - Symptomatic, >50% in bed, but not bedbound (Capable of only limited self-care, confined to bed or chair 50% or more of waking hours)  4 - Bedbound (Completely disabled. Cannot carry on any self-care. Totally confined to bed or chair)  5 - Death   Eustace Pen MM, Creech RH, Tormey DC, et al. [phone]). "Toxicity and response criteria of the [facility]". [place] Oncol. 5 (6): 649-55     LABORATORY DATA:  Lab Results  Component Value Date   WBC 5.0 06/02/2014   HGB 13.1 06/02/2014   HCT 37.7* 06/02/2014   MCV 91.7 06/02/2014   PLT 417* 06/02/2014   Lab Results  Component Value Date   NA 136* 06/02/2014   K 3.7 06/02/2014   CL 96 06/02/2014   CO2 26 06/02/2014   Lab Results  Component Value Date   ALT 10 05/12/2014   AST 25 05/12/2014   ALKPHOS 101 05/12/2014   BILITOT  0.58 05/12/2014      RADIOGRAPHY: Ct Chest Wo Contrast  06/04/2014   ADDENDUM REPORT: 06/04/2014 14:11  ADDENDUM: Dr. Benay Spice stopped by and reported that this patient is having severe, pleuritic left paramidline anterior chest wall pain. Careful review of this exam does not show definite pericardial or left anterior parasternal soft tissue disease. However, assessment is hindered by the lack of intravenous contrast material. When reviewing the study from 05/25/2014 performed with intravenous contrast, there is a subtle 16 mm soft tissue nodule seen just anterior to the right ventricle (see image 81 of series 4 from the 05/25/2014 CT of the chest) consistent with pericardial disease. This would be consistent with a metastatic deposit and a likely etiology for the patient's chest pain as Dr. Benay Spice states that this correlates directly with the location of the  patient's symptoms.   Electronically Signed   By: Misty Stanley M.D.   On: 06/04/2014 14:11   06/04/2014   CLINICAL DATA:  Pneumonia versus progressive lung cancer.  EXAM: CT CHEST WITHOUT CONTRAST  TECHNIQUE: Multidetector CT imaging of the chest was performed following the standard protocol without IV contrast.  COMPARISON:  05/25/2014  FINDINGS: Examination is technically limited for evaluation of hilar and mediastinal structures without IV contrast material.  Cardiac enlargement. Normal caliber thoracic aorta. Probable vascular congestion. Bilateral hilar and probable mediastinal lymphadenopathy, better visualized on previous contrast enhanced study. Airways remain patent although there is evidence of mucous in distal trachea and mild narrowing of the left lower lobe bronchus probably due to extrinsic compression. Left suprahilar mass lesion again demonstrated, measuring 3.2 bone 1.9 cm, similar to prior study. Diffuse emphysematous changes in the lungs. Increasing atelectasis or consolidation in the lung bases. No pneumothorax or effusion. Visualized  portions of the upper abdominal organs are grossly unremarkable.  IMPRESSION: No definitive change in appearance of left suprahilar mass and bilateral hilar and mediastinal lymphadenopathy although evaluation is limited without contrast material. There appears to be increasing infiltration or atelectasis in the lung bases. Diffuse emphysematous changes.  Electronically Signed: By: Lucienne Capers M.D. On: 06/03/2014 02:47   Ct Chest W Contrast  05/12/2014   CLINICAL DATA:  Metastatic adenocarcinoma.  XRT and chemo complete.  EXAM: CT CHEST, ABDOMEN, AND PELVIS WITH CONTRAST  TECHNIQUE: Multidetector CT imaging of the chest, abdomen and pelvis was performed following the standard protocol during bolus administration of intravenous contrast.  CONTRAST:  162mL OMNIPAQUE IOHEXOL 300 MG/ML  SOLN  COMPARISON:  02/18/2014  FINDINGS: CT CHEST FINDINGS  There is no pleural effusion identified. Scar is identified in the left base. Moderate changes of centrilobular emphysema identified. Left upper lobe pulmonary nodule measures 3.1 x 1.8 cm, image 27/ series 5. Previously 2.7 x 1.8 cm. 6 mm subpleural nodule along the fissure of the left lung is identified, image 39/ series 5. This is not significantly changed from previous exam.  Normal heart size. There is no pericardial effusion. Bilateral hilar and mediastinal adenopathy is again identified. Index left hilar lymph node measures 2.3 cm, image 40/ series 2. Previously 2 cm. AP window lymph node measures 2.5 x 2.9 cm, image 27/ series 2. Previously 3.4 x 2.9 cm. Right paratracheal lymph node measured 1.4 cm, image 29/ series 2. Previously 1.2 cm. The index right hilar node measures 2.3 cm, image 35/ series 2. No axillary or supraclavicular adenopathy.  Review of the visualized bony structures shows no evidence for aggressive lytic or sclerotic bone lesion.  CT ABDOMEN AND PELVIS FINDINGS  Low attenuation structure within the posterior right hepatic lobe measures 1.1 cm,  image 81/ series 2. Previously 1.3 cm. Gallbladder is normal. The pancreas is on unremarkable. Normal appearance of the spleen.  The adrenal glands are both normal. The kidneys are both on unremarkable. The urinary bladder is within normal limits. Prostate gland appears enlarged and has mass effect upon the bladder base.  There is calcified atherosclerotic disease involving the abdominal aorta. No aneurysm. No upper abdominal adenopathy identified. There is no pelvic or inguinal adenopathy noted.  The stomach is normal. The small bowel loops have a normal course and caliber. No obstruction. The appendix is visualized and appears normal. Normal appearance of the colon.  Review of the visualized bony shows no aggressive lytic or sclerotic bone lesions.  IMPRESSION: 1. Today's study demonstrates mild progression of  disease with interval enlargement of the left upper lobe pulmonary nodule. Additionally, there has been enlargement of both hilar lymph nodes. 2. Stable nonspecific low attenuation structure within the posterior right hepatic lobe.   Electronically Signed   By: Kerby Moors M.D.   On: 05/12/2014 15:48   Ct Angio Chest Pe W/cm &/or Wo Cm  05/25/2014   CLINICAL DATA:  Chest pain.  Shortness of breath.  Chest wall mass.  EXAM: CT ANGIOGRAPHY CHEST WITH CONTRAST  TECHNIQUE: Multidetector CT imaging of the chest was performed using the standard protocol during bolus administration of intravenous contrast. Multiplanar CT image reconstructions and MIPs were obtained to evaluate the vascular anatomy.  CONTRAST:  15mL OMNIPAQUE IOHEXOL 350 MG/ML SOLN  COMPARISON:  05/12/2014.  FINDINGS: Technically adequate study without pulmonary embolus. No acute aortic abnormality. Cardiomegaly is present. Lungs demonstrate bulky bilateral hilar adenopathy and mediastinal adenopathy, compatible with malignant adenopathy. Left upper lobe mass has increased slightly in size, measuring 33 mm AP by 20 mm transverse. Severe  emphysema is present. No airspace disease/pneumonia. Prominent dependent atelectasis.  The hilar adenopathy appears to of increased in the short interval since the prior exam, with right hilar node today measuring 26 mm, previously 23 mm. Some of this probably has a do with slice selection. Left hilar node appears similar. Radiodense object present in the left upper quadrant of the abdomen which potentially represents contents in the enteric stream.  Review of the MIP images confirms the above findings.  IMPRESSION: 1. No pulmonary embolus or acute aortic abnormality. 2. Mild progression of higher and mediastinal adenopathy along with left upper lobe pulmonary mass measuring 33 mm x 20 mm.   Electronically Signed   By: Dereck Ligas M.D.   On: 05/25/2014 22:39   Ct Abdomen Pelvis W Contrast  05/12/2014   CLINICAL DATA:  Metastatic adenocarcinoma.  XRT and chemo complete.  EXAM: CT CHEST, ABDOMEN, AND PELVIS WITH CONTRAST  TECHNIQUE: Multidetector CT imaging of the chest, abdomen and pelvis was performed following the standard protocol during bolus administration of intravenous contrast.  CONTRAST:  122mL OMNIPAQUE IOHEXOL 300 MG/ML  SOLN  COMPARISON:  02/18/2014  FINDINGS: CT CHEST FINDINGS  There is no pleural effusion identified. Scar is identified in the left base. Moderate changes of centrilobular emphysema identified. Left upper lobe pulmonary nodule measures 3.1 x 1.8 cm, image 27/ series 5. Previously 2.7 x 1.8 cm. 6 mm subpleural nodule along the fissure of the left lung is identified, image 39/ series 5. This is not significantly changed from previous exam.  Normal heart size. There is no pericardial effusion. Bilateral hilar and mediastinal adenopathy is again identified. Index left hilar lymph node measures 2.3 cm, image 40/ series 2. Previously 2 cm. AP window lymph node measures 2.5 x 2.9 cm, image 27/ series 2. Previously 3.4 x 2.9 cm. Right paratracheal lymph node measured 1.4 cm, image 29/ series  2. Previously 1.2 cm. The index right hilar node measures 2.3 cm, image 35/ series 2. No axillary or supraclavicular adenopathy.  Review of the visualized bony structures shows no evidence for aggressive lytic or sclerotic bone lesion.  CT ABDOMEN AND PELVIS FINDINGS  Low attenuation structure within the posterior right hepatic lobe measures 1.1 cm, image 81/ series 2. Previously 1.3 cm. Gallbladder is normal. The pancreas is on unremarkable. Normal appearance of the spleen.  The adrenal glands are both normal. The kidneys are both on unremarkable. The urinary bladder is within normal limits. Prostate gland appears  enlarged and has mass effect upon the bladder base.  There is calcified atherosclerotic disease involving the abdominal aorta. No aneurysm. No upper abdominal adenopathy identified. There is no pelvic or inguinal adenopathy noted.  The stomach is normal. The small bowel loops have a normal course and caliber. No obstruction. The appendix is visualized and appears normal. Normal appearance of the colon.  Review of the visualized bony shows no aggressive lytic or sclerotic bone lesions.  IMPRESSION: 1. Today's study demonstrates mild progression of disease with interval enlargement of the left upper lobe pulmonary nodule. Additionally, there has been enlargement of both hilar lymph nodes. 2. Stable nonspecific low attenuation structure within the posterior right hepatic lobe.   Electronically Signed   By: Kerby Moors M.D.   On: 05/12/2014 15:48   Dg Chest Port 1 View  06/02/2014   CLINICAL DATA:  Left-sided chest and arm pain for 1 week. Wrist prior rotatory distress.  EXAM: PORTABLE CHEST - 1 VIEW  COMPARISON:  05/25/2014  FINDINGS: Heart size is normal. Normal pulmonary vascularity. Increasing perihilar mass or infiltration bilaterally. This corresponds to perihilar lymphadenopathy noted on previous CT chest 05/25/2014. Stable interstitial changes in the lungs with suggestion of focal increasing  opacity in the left lung base. Developing pneumonia is not excluded. No blunting of costophrenic angles. No pneumothorax.  IMPRESSION: Increasing bilateral perihilar mass or infiltration with suggestion of focal developing infiltration in the left lung base.   Electronically Signed   By: Lucienne Capers M.D.   On: 06/02/2014 22:36   Dg Chest Port 1 View  05/25/2014   CLINICAL DATA:  58 year old male with chest pain shortness of breath fever cough and weakness. Initial encounter. Metastatic adenocarcinoma.  EXAM: PORTABLE CHEST - 1 VIEW  COMPARISON:  Chest CTA 05/12/2014.  FINDINGS: Portable AP upright view at 2054 hrs. Stable cardiac size and mediastinal contours. Visualized tracheal air column is within normal limits. No pneumothorax, pulmonary edema, or pleural effusion. There is increased streaky left lung base opacity which may be postobstructive in light of the recent CT.  IMPRESSION: Mildly increased streaky left lung base opacity, suspicious for postobstructive infection in light of the recent chest CT findings an the clinical presentation.   Electronically Signed   By: Lars Pinks M.D.   On: 05/25/2014 21:26       IMPRESSION: The patient has metastatic adenocarcinoma and has a left anterior chest soft tissue nodule which is a good candidate lesion for significant anterior chest pain which has been ongoing for the patient in recent weeks. The patient is a good candidate I believe for palliative radiation treatment to this area and I have been asked to see him regarding this. The patient is eager to begin treatment.  I discussed with him the rationale of treatment once again which would be similar to his prior treatment at a different site. We discussed the benefit of treatment as well as the possible side effects and risks of treatment to this area.   PLAN: The patient will undergo simulation today such that we can proceed with treatment planning. I anticipate beginning the patient's treatment on  Monday early next week and we will treat him in 10 fractions.       ________________________________   Philip Gross, MD, PhD   **Disclaimer: This note was dictated with voice recognition software. Similar sounding words can inadvertently be transcribed and this note may contain transcription errors which may not have been corrected upon publication of note.**

## 2014-06-04 NOTE — Progress Notes (Addendum)
Pt receiving PRN Dilaudid. Asked pt to call for staff stand-by assist prior to getting up to the bathroom. Pt refused. Turned bed alarm on. Pt became irate when bed alarm sounded after pt got up without calling for staff presence. Bed alarm turned off. Education provided re: safety and fall risk. Pt adamant that he does not require assistance getting up to go to the bathroom, and it is unnecessary that he call first or that the bed alarm be placed on. Observed pt ambulating to BR, and pt does appear steady on his feet. ... Blair Hailey RN

## 2014-06-04 NOTE — Progress Notes (Signed)
Utilization review completed.  

## 2014-06-04 NOTE — Progress Notes (Signed)
IP PROGRESS NOTE  Subjective:   Philip Andrews is known to me with a history of metastatic adenocarcinoma. He completed 2 cycles of chemotherapy and declined further treatment. A restaging CT 05/12/2014 revealed mild progression of disease in the chest.  He has been admitted repeatedly with left anterior chest pain. He was admitted again 06/03/2014. He complains of pain at the left mid anterior chest. The pain is pleuritic. No fever. He has exertional dyspnea.  Objective: Vital signs in last 24 hours: Blood pressure 108/65, pulse 95, temperature 98.3 F (36.8 C), temperature source Oral, resp. rate 20, height 5\' 9"  (1.753 m), weight 124 lb 12.5 oz (56.6 kg), SpO2 93.00%.  Intake/Output from previous day: 06/25 0701 - 06/26 0700 In: 360 [P.O.:360] Out: -   Physical Exam:  HEENT: Neck without mass Lungs: Clear bilaterally, no respiratory distress Cardiac: Regular rate and rhythm, no rub Abdomen: No hepatomegaly, nontender Extremities: No leg edema Musculoskeletal: Tender over the left medial anterior chest wall. No mass or rash.    Lab Results:  Recent Labs  06/02/14 2211  WBC 5.0  HGB 13.1  HCT 37.7*  PLT 417*    BMET  Recent Labs  06/02/14 2211  NA 136*  K 3.7  CL 96  CO2 26  GLUCOSE 87  BUN 14  CREATININE 0.66  CALCIUM 9.2    Studies/Results: Ct Chest Wo Contrast  06/03/2014   CLINICAL DATA:  Pneumonia versus progressive lung cancer.  EXAM: CT CHEST WITHOUT CONTRAST  TECHNIQUE: Multidetector CT imaging of the chest was performed following the standard protocol without IV contrast.  COMPARISON:  05/25/2014  FINDINGS: Examination is technically limited for evaluation of hilar and mediastinal structures without IV contrast material.  Cardiac enlargement. Normal caliber thoracic aorta. Probable vascular congestion. Bilateral hilar and probable mediastinal lymphadenopathy, better visualized on previous contrast enhanced study. Airways remain patent although there is  evidence of mucous in distal trachea and mild narrowing of the left lower lobe bronchus probably due to extrinsic compression. Left suprahilar mass lesion again demonstrated, measuring 3.2 bone 1.9 cm, similar to prior study. Diffuse emphysematous changes in the lungs. Increasing atelectasis or consolidation in the lung bases. No pneumothorax or effusion. Visualized portions of the upper abdominal organs are grossly unremarkable.  IMPRESSION: No definitive change in appearance of left suprahilar mass and bilateral hilar and mediastinal lymphadenopathy although evaluation is limited without contrast material. There appears to be increasing infiltration or atelectasis in the lung bases. Diffuse emphysematous changes.   Electronically Signed   By: Lucienne Capers M.D.   On: 06/03/2014 02:47   Dg Chest Port 1 View  06/02/2014   CLINICAL DATA:  Left-sided chest and arm pain for 1 week. Wrist prior rotatory distress.  EXAM: PORTABLE CHEST - 1 VIEW  COMPARISON:  05/25/2014  FINDINGS: Heart size is normal. Normal pulmonary vascularity. Increasing perihilar mass or infiltration bilaterally. This corresponds to perihilar lymphadenopathy noted on previous CT chest 05/25/2014. Stable interstitial changes in the lungs with suggestion of focal increasing opacity in the left lung base. Developing pneumonia is not excluded. No blunting of costophrenic angles. No pneumothorax.  IMPRESSION: Increasing bilateral perihilar mass or infiltration with suggestion of focal developing infiltration in the left lung base.   Electronically Signed   By: Lucienne Capers M.D.   On: 06/02/2014 22:36    Medications: I have reviewed the patient's current medications.  Assessment/Plan:  1. Metastatic adenocarcinoma involving a pericardial effusion, TTF-1 and Napsin-A negative.  Status post pericardial window procedure  and bronchoscopy on 10/31/2013. No tumor found at bronchoscopy and the pericardial tissue was negative for malignancy.   Staging PET scan 11/17/2013 with a hypermetabolic 1.6 cm left upper lobe suprahilar nodule with SUV max 9.1; medial to that nodule is a 6 mm nodule not showing any increased metabolic activity; multiple hypermetabolic small mediastinal and hilar lymph nodes bilaterally; additional nodes present in the AP window, prevascular space and both hila; focally increased metabolic activity within the soft tissues of the right back with SUV max 6.7.  Status post palliative radiation to a soft tissue metastasis at the right back 11/26/2013 through 12/15/2013.  Restaging CT chest and abdomen 02/18/2014. Interval enlargement of the left upper lobe pulmonary nodule; enlargement of pericardial lesions overlying the heart; enlargement of bilateral hilar and mediastinal lymph nodes; ill-defined lesion in the liver not identified on prior studies. Previously suspected soft tissue lesion in the paraspinous muscular posterior to the right kidney not confidently identified on current study.  Cycle 1 Taxol/carboplatin 03/04/2014.  Cycle 2 Taxol/carboplatin 03/25/2014. Restaging CT 05/12/2014 with mild progression of the chest lymphadenopathy 2. Respiratory failure/cardiac tamponade secondary to #1. Resolved. 3. History of polysubstance abuse. 4. History of Acute renal failure. Resolved. 5. Mild normocytic anemia. 6. Left hilar mass on the noncontrast chest CT 10/24/2013. 7. Right low back/flank pain. Likely secondary to a soft tissue metastasis at the right back. He completed palliative radiation 11/26/2013 through 12/15/2013. Resolved.  8. Repeat hospital admission with left anterior chest pain-most likely secondary to chest wall or pericardial metastases  9. Parenchymal infiltrates in the lower lungs-potentially nodular tumor spread   Philip Andrews is again admitted with left anterior chest pain. I suspect the pain is related to metastatic carcinoma. The pain may be secondary to pericardial/pleural metastases or chest wall  tumor. I will review the CT images in radiology and recommend palliative radiation as indicated. Philip Andrews appears to have a limited understanding of his disease process and has been noncompliant with followup at the [facility]. I recommend Hospice care.  Recommendations: 1. I will review the chest CT images and recommend palliative radiation as indicated 2. Narcotic analgesics for pain 3. [facility] hospice referral  I will discuss the case with Dr. Charlies Silvers. Please call oncology as needed over the weekend     LOS: 2 days   [place], [place]  06/04/2014, 10:50 AM

## 2014-06-05 DIAGNOSIS — R079 Chest pain, unspecified: Secondary | ICD-10-CM

## 2014-06-05 DIAGNOSIS — C381 Malignant neoplasm of anterior mediastinum: Secondary | ICD-10-CM

## 2014-06-05 MED ORDER — HYDROMORPHONE HCL PF 1 MG/ML IJ SOLN
1.0000 mg | INTRAMUSCULAR | Status: DC | PRN
  Administered 2014-06-05: 2 mg via INTRAVENOUS
  Administered 2014-06-05: 1 mg via INTRAVENOUS
  Administered 2014-06-05 – 2014-06-07 (×11): 2 mg via INTRAVENOUS
  Administered 2014-06-07: 1 mg via INTRAVENOUS
  Administered 2014-06-07 – 2014-06-10 (×10): 2 mg via INTRAVENOUS
  Administered 2014-06-10: 1 mg via INTRAVENOUS
  Administered 2014-06-10: 2 mg via INTRAVENOUS
  Administered 2014-06-11 (×2): 1 mg via INTRAVENOUS
  Administered 2014-06-11 (×5): 2 mg via INTRAVENOUS
  Filled 2014-06-05 (×6): qty 2
  Filled 2014-06-05: qty 1
  Filled 2014-06-05 (×10): qty 2
  Filled 2014-06-05: qty 1
  Filled 2014-06-05 (×2): qty 2
  Filled 2014-06-05: qty 1
  Filled 2014-06-05 (×14): qty 2

## 2014-06-05 MED ORDER — AZITHROMYCIN 500 MG PO TABS
500.0000 mg | ORAL_TABLET | Freq: Every day | ORAL | Status: DC
  Administered 2014-06-05 – 2014-06-09 (×4): 500 mg via ORAL
  Filled 2014-06-05 (×6): qty 1

## 2014-06-05 NOTE — Progress Notes (Addendum)
Patient ID: Philip Andrews, male   DOB: [birth year], 58 y.o.   MRN: [id number] [facility] PROGRESS NOTE  Philip Andrews PZW:[id number] DOB: 1956-05-30 DOA: 06/02/2014 PCP: No PCP Per Patient  Brief narrative: 58 y.o. 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 CT chest done on this admission showed no changes in left suprahilar mass and bilateral hilar and mediastinal lymphadenopathy. There was however worsening infiltration in lung bases. CXR showed increasing bilateral perihilar mass or infiltration with suggestion of focal developing infiltration in the left lung base. Pt was started on abx for treatment of possible pneumonia. He continues to have chest pain which is likely due to soft tissue nodule (16 mm) seen anterior to the right ventricle (seen on CT chest) consistent with pericardial disease which likely is contributing to patient's chest pain. Plan is for RT to start likely Monday.  Assessment/Plan:   Principal Problem:  Acute respiratory failure with hypoxia / Community acquired pneumonia  Secondary to lung cancer and community acquired pneumonia  CXR and CT chest suggestive of infiltrates in lung bases.  Continue azithromycin and rocephin for now and follow up results of blood cultures  Oxygen support via [place] to keep O2 saturation above 90% Active Problems: Chest pain / soft tissue nodule anterior to right ventricle / pericardial disease   Seen on CT chest soft tissue nodule about 16 mm in size anterior to right ventricle c/w pericardial disease and likely contributes to pt complaints of chest pain  Plan is for RT likely Monday; simulation done 06/04/2014 by Dr. Lisbeth Renshaw Metastatic lung cancer  Appreciate Dr. Benay Spice seeing the pt; recommendation  for palliative RT and hospice referral   Severe protein calorie malnutrition  Nutrition  consulted   DVT prophylaxis:heparin sub Q while pt is in hospital   Code Status: full code  Family Communication: plan of care discussed with the patient  Disposition Plan: home when stable  Consultants:  Oncology (Dr. Benay Spice) Radiation oncology (Dr. Lisbeth Renshaw) Procedures:  Simulation radiation 06/04/2014; plan for RT on Monday 06/07/2014  Antibiotics:  Azithromycin 06/02/2014 -->  Rocephin 06/03/2014 -->  Leisa Lenz, MD  [facility] Pager [phone]  If 7PM-7AM, please contact night-coverage [email or web address] Password [facility] 06/05/2014, 9:35 AM   LOS: 3 days    HPI/Subjective: No acute overnight events. Still has significant chest pain.  Objective: Filed Vitals:   06/04/14 0532 06/04/14 1407 06/04/14 2133 06/05/14 0449  BP: 108/65 109/66 104/64 91/53  Pulse: 95 88 91 80  Temp: 98.3 F (36.8 C)  97.9 F (36.6 C) 98.3 F (36.8 C)  TempSrc: Oral  Oral Oral  Resp: 20 16 18 18   Height:      Weight:      SpO2: 93% 92% 91% 95%    Intake/Output Summary (Last 24 hours) at 06/05/14 0935 Last data filed at 06/05/14 0300  Gross per 24 hour  Intake   1863 ml  Output      0 ml  Net   1863 ml    Exam:   General:  Pt is alert, no distress   Cardiovascular: Regular rate and rhythm, S1/S2, no murmurs  Respiratory: congested, rhonchi in upper lungs but no wheezing  Abdomen: Soft, non tender, non distended, bowel sounds present  Extremities: No edema, pulses DP and PT palpable bilaterally  Neuro: Grossly nonfocal  Data Reviewed: Basic Metabolic  Panel:  Recent Labs Lab 06/02/14 2211  NA 136*  K 3.7  CL 96  CO2 26  GLUCOSE 87  BUN 14  CREATININE 0.66  CALCIUM 9.2   Liver Function Tests: No results found for this basename: AST, ALT, ALKPHOS, BILITOT, PROT, ALBUMIN,  in the last 168 hours No results found for this basename: LIPASE, AMYLASE,  in the last 168 hours No results found for this basename: AMMONIA,  in the last 168 hours CBC:  Recent Labs Lab  06/02/14 2211  WBC 5.0  HGB 13.1  HCT 37.7*  MCV 91.7  PLT 417*   Cardiac Enzymes: No results found for this basename: CKTOTAL, CKMB, CKMBINDEX, TROPONINI,  in the last 168 hours BNP: No components found with this basename: POCBNP,  CBG: No results found for this basename: GLUCAP,  in the last 168 hours  Recent Results (from the past 240 hour(s))  CULTURE, BLOOD (ROUTINE X 2)     Status: None   Collection Time    06/03/14 12:28 AM      Result Value Ref Range Status   Specimen Description BLOOD RIGHT ARM   Final   Special Requests BOTTLES DRAWN AEROBIC AND ANAEROBIC 10CC EACH   Final   Culture  Setup Time     Final   Value: 06/03/2014 09:24     Performed at [facility]   Culture     Final   Value:        BLOOD CULTURE RECEIVED NO GROWTH TO DATE CULTURE WILL BE HELD FOR 5 DAYS BEFORE ISSUING A FINAL NEGATIVE REPORT     Performed at [facility]   Report Status PENDING   Incomplete  CULTURE, BLOOD (ROUTINE X 2)     Status: None   Collection Time    06/03/14 12:38 AM      Result Value Ref Range Status   Specimen Description BLOOD RIGHT HAND   Final   Special Requests BOTTLES DRAWN AEROBIC AND ANAEROBIC [facility]   Final   Culture  Setup Time     Final   Value: 06/03/2014 09:25     Performed at [facility]   Culture     Final   Value:        BLOOD CULTURE RECEIVED NO GROWTH TO DATE CULTURE WILL BE HELD FOR 5 DAYS BEFORE ISSUING A FINAL NEGATIVE REPORT     Performed at [facility]   Report Status PENDING   Incomplete     Studies: No results found.  Scheduled Meds: . azithromycin  500 mg Oral QHS  . cefTRIAXone (ROCEPHIN)  IV  1 g Intravenous Q24H  . feeding supplement (ENSURE COMPLETE)  237 mL Oral TID WC  . sodium chloride  3 mL Intravenous Q12H   Continuous Infusions:

## 2014-06-05 NOTE — Evaluation (Signed)
Physical Therapy Evaluation Patient Details Name: IRISH PIECH MRN: [id number] DOB: [birth year] Today's Date: 06/05/2014   History of Present Illness  58 yo male admitted with chest pain likely due to lung cancer. Hx of lung cancer withmets, substance abuse.   Clinical Impression  On eval, pt required supervision level assist for mobility-able to ambulate ~375 feet while holding onto hallway railing intermittently. Anticipate pt will progress well with mobility during stay. Recommend daily mobility with nursing supervision.     Follow Up Recommendations No PT follow up    Equipment Recommendations  None recommended by PT    Recommendations for Other Services       Precautions / Restrictions Precautions Precautions: Fall Restrictions Weight Bearing Restrictions: No      Mobility  Bed Mobility Overal bed mobility: Modified Independent                Transfers Overall transfer level: Modified independent                  Ambulation/Gait Ambulation/Gait assistance: Supervision Ambulation Distance (Feet): 375 Feet Assistive device: None (pt held onto hallway railing intermittently)       General Gait Details: good gait speed. slightly unsteady but no LOB  Stairs            Wheelchair Mobility    Modified Rankin (Stroke Patients Only)       Balance                                             Pertinent Vitals/Pain 6/10 chest with activity.     Home Living Family/patient expects to be discharged to:: Private residence Living Arrangements: Non-relatives/Friends Available Help at Discharge: Available PRN/intermittently Type of Home: House Home Access: Level entry     Home Layout: Able to live on main level with bedroom/bathroom Home Equipment: None Additional Comments: Lives in basement of home of lady he does house chores for.      Prior Function Level of Independence: Independent         Comments: doesnt drive      Hand Dominance        Extremity/Trunk Assessment   Upper Extremity Assessment: Generalized weakness           Lower Extremity Assessment: Generalized weakness      Cervical / Trunk Assessment: Kyphotic  Communication   Communication: No difficulties  Cognition Arousal/Alertness: Awake/alert Behavior During Therapy: WFL for tasks assessed/performed Overall Cognitive Status: Within Functional Limits for tasks assessed                      General Comments      Exercises        Assessment/Plan    PT Assessment Patient needs continued PT services  PT Diagnosis Difficulty walking;Generalized weakness;Acute pain   PT Problem List Decreased strength;Decreased activity tolerance;Decreased balance;Decreased mobility;Pain  PT Treatment Interventions DME instruction;Gait training;Functional mobility training;Therapeutic activities;Therapeutic exercise;Patient/family education;Balance training   PT Goals (Current goals can be found in the Care Plan section) Acute Rehab PT Goals Patient Stated Goal: less pain. home soon.  PT Goal Formulation: With patient Time For Goal Achievement: 06/19/14 Potential to Achieve Goals: Good    Frequency Min 3X/week   Barriers to discharge        Co-evaluation  End of Session   Activity Tolerance: Patient limited by pain Patient left: in bed;with call bell/phone within reach           Time: 1117-1126 PT Time Calculation (min): 9 min   Charges:   PT Evaluation $Initial PT Evaluation Tier I: 1 Procedure PT Treatments $Gait Training: 8-22 mins   PT G Codes:          Weston Anna, MPT Pager: [phone]

## 2014-06-05 NOTE — Progress Notes (Signed)
PHARMACIST - PHYSICIAN COMMUNICATION DR:  Benay Spice and Charlies Silvers CONCERNING: Antibiotic IV to Oral Route Change Policy  RECOMMENDATION: This patient is receiving Zithromax by the intravenous route.  Based on criteria approved by the Pharmacy and Therapeutics Committee, the antibiotic(s) is/are being converted to the equivalent oral dose form(s).   DESCRIPTION: These criteria include:  Patient being treated for a respiratory tract infection, urinary tract infection, cellulitis or clostridium difficile associated diarrhea if on metronidazole  The patient is not neutropenic and does not exhibit a GI malabsorption state  The patient is eating (either orally or via tube) and/or has been taking other orally administered medications for a least 24 hours  The patient is improving clinically and has a Tmax < 100.5  If you have questions about this conversion, please contact the Pharmacy Department  []   [phone] )  [facility] Na []   [phone] )  [facility] Pontes  []   [phone] )  [facility] [x]   [phone] )  [place], PharmD, [place] Pager [phone] 06/05/2014 8:44 AM

## 2014-06-06 DIAGNOSIS — N178 Other acute kidney failure: Secondary | ICD-10-CM

## 2014-06-06 NOTE — Progress Notes (Signed)
Clinical Social Work Department CLINICAL SOCIAL WORK PLACEMENT NOTE 06/06/2014  Patient:  Philip Andrews, Philip Andrews  Account Number:  [id number] Admit date:  06/04/2014  Clinical Social Worker:  Levie Heritage  Date/time:  06/06/2014 11:22 AM  Clinical Social Work is seeking post-discharge placement for this patient at the following level of care:   SKILLED NURSING   (*CSW will update this form in Epic as items are completed)   06/06/2014  Patient/family provided with [place] Department of Clinical Social Work's list of facilities offering this level of care within the geographic area requested by the patient (or if unable, by the patient's family).  06/06/2014  Patient/family informed of their freedom to choose among providers that offer the needed level of care, that participate in Medicare, Medicaid or managed care program needed by the patient, have an available bed and are willing to accept the patient.  06/06/2014  Patient/family informed of [facility]' ownership interest in [facility], as well as of the fact that they are under no obligation to receive care at this facility.  PASARR submitted to EDS on existing PASARR number received on   FL2 transmitted to all facilities in geographic area requested by pt/family on  06/06/2014 FL2 transmitted to all facilities within larger geographic area on   Patient informed that his/her managed care company has contracts with or will negotiate with  certain facilities, including the following:     Patient/family informed of bed offers received:   Patient chooses bed at  Physician recommends and patient chooses bed at    Patient to be transferred to  on   Patient to be transferred to facility by  Patient and family notified of transfer on  Name of family member notified:    The following physician request were entered in Epic:   Additional Comments:  Bernita Raisin, [place] [facility] [phone]

## 2014-06-06 NOTE — Progress Notes (Signed)
NUTRITION FOLLOW UP  Intervention:   - Continue Ensure Complete po TID, each supplement provides 350 kcal and 13 grams of protein - Continue 8 pm nourishment - Encouraged intake of high kcal/protein foods. Provided handouts and used teach-back method - Discussed nutrition therapy for nausea  - Will continue to monitor  Nutrition Dx:   Inadequate oral intake related to nausea/abd pain/vomiting as evidenced by PO intake <75%, 7.4% body weight loss in one month; ongoing  Goal:   Pt to meet >/= 90% of their estimated nutrition needs; not met  Monitor:   Total protein/energy intake, labs, weight, diet education needs, GI profile  Assessment:   Philip Andrews is a 58 y.o. 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 Has been unable to tolerate solid foods, consuming 3 Ensure Complete daily.  -Encouraged intake of soft high kcal/protein foods- peanut butter, whey powder to mix in w/milk and/or supplements, ice cream, puddings. Pt in agreement to consume milkshake as 8PM nourishment for nutrient replenishment  -Current PO intake is 50%, receiving assistance for meal ordering  -Discussed intake of bland foods and gingerale to assist with nausea  -Provided supplement coupons  -Endorsed an unintentional wt loss of 42 lbs since 10/2013. -Experienced 10 lbs weight loss in last month, or 7.4% body weight loss in one month, which is severe for time frame  -Pt with evident signs of severe muscle wasting and subcutaneous fat loss in temporal, clavicle and acromion regions  6/28: - Pt continues to drink his ensure TID. - Pt reports that he still is experiencing nausea and pain which is still effecting his appetite. - Pt was provided with nutrition therapy and re-educated on a high-calorie, high-protein diet as well as ways to reduce nausea. - Pt was encouraged  to consume small, frequent meals and snacks.  Height: Ht Readings from Last 1 Encounters:  06/03/14 $RemoveB'5\' 9"'[id number]$  (1.753 m)    Weight Status:   Wt Readings from Last 1 Encounters:  06/03/14 124 lb 12.5 oz (56.6 kg)    Re-estimated needs:  Kcal: 1900-2100 Protein: 85-100 g Fluid: 1.9-2.1 L/day  Skin: WNL  Diet Order: General   Intake/Output Summary (Last 24 hours) at 06/06/14 1112 Last data filed at 06/05/14 2300  Gross per 24 hour  Intake    290 ml  Output      0 ml  Net    290 ml    Last BM: 6/26   Labs:   Recent Labs Lab 06/02/14 2211  NA 136*  K 3.7  CL 96  CO2 26  BUN 14  CREATININE 0.66  CALCIUM 9.2  GLUCOSE 87    CBG (last 3)  No results found for this basename: GLUCAP,  in the last 72 hours  Scheduled Meds: . azithromycin  500 mg Oral QHS  . cefTRIAXone (ROCEPHIN)  IV  1 g Intravenous Q24H  . feeding supplement (ENSURE COMPLETE)  237 mL Oral TID WC  . sodium chloride  3 mL Intravenous Q12H    Continuous Infusions:   Terrace Arabia RD, LDN

## 2014-06-06 NOTE — Progress Notes (Addendum)
Patient ID: DRESEAN BECKEL, male   DOB: 26-Nov-1956, 58 y.o.   MRN: [id number] [facility] PROGRESS NOTE  DEVARIS QUIRK UYQ:[id number] DOB: 08-17-1956 DOA: 06/02/2014 PCP: No PCP Per Patient  Brief narrative: 58 y.o. 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 CT chest done on this admission showed no changes in left suprahilar mass and bilateral hilar and mediastinal lymphadenopathy. There was however worsening infiltration in lung bases. CXR showed increasing bilateral perihilar mass or infiltration with suggestion of focal developing infiltration in the left lung base. Pt was started on abx for treatment of possible pneumonia. He continues to have chest pain which is likely due to soft tissue nodule (16 mm) seen anterior to the right ventricle (seen on CT chest) consistent with pericardial disease which likely is contributing to patient's chest pain. Plan is for RT to start likely Monday.   Assessment/Plan:   Principal Problem:  Acute respiratory failure with hypoxia / Community acquired pneumonia  Secondary to combination of lung cancer, community acquired pneumonia  CXR and CT chest suggestive of infiltrates in lung bases.  Continue azithromycin and rocephin  Blood cultures to date are negarive Oxygen support via [place] to keep O2 saturation above 90% Active Problems:  Chest pain / soft tissue nodule anterior to right ventricle / pericardial disease  Seen on CT chest soft tissue nodule about 16 mm in size anterior to right ventricle c/w pericardial disease and likely contributes to pt complaints of chest pain Pain management with IV dilaudid PRN Plan is for RT likely Monday; simulation done 06/04/2014 by Dr. Lisbeth Renshaw Metastatic lung cancer  Appreciate Dr. Benay Spice seeing the pt; recommendation for palliative RT and hospice referral  Severe  protein calorie malnutrition   Evidenced by PO intake <75%, 7.4% body weight loss in one month   DVT prophylaxis:heparin sub Q while pt is in hospital   Code Status: full code  Family Communication: plan of care discussed with the patient  Disposition Plan: home when stable   Consultants:  Oncology (Dr. Benay Spice)  Radiation oncology (Dr. Lisbeth Renshaw) Procedures:  Simulation radiation 06/04/2014; plan for RT on Monday 06/07/2014  Antibiotics:  Azithromycin 06/02/2014 -->  Rocephin 06/03/2014 -->   Leisa Lenz, MD  [facility] Pager [phone]  If 7PM-7AM, please contact night-coverage [email or web address] Password [facility] 06/06/2014, 1:31 PM   LOS: 4 days    HPI/Subjective: No acute overnight events.  Objective: Filed Vitals:   06/05/14 0449 06/05/14 1456 06/05/14 2100 06/06/14 0530  BP: 91/53 100/62 109/65 106/70  Pulse: 80 85 92 92  Temp: 98.3 F (36.8 C) 98.1 F (36.7 C) 98.3 F (36.8 C) 98.6 F (37 C)  TempSrc: Oral Oral Oral Oral  Resp: 18 18 18 18   Height:      Weight:      SpO2: 95% 95% 93% 95%    Intake/Output Summary (Last 24 hours) at 06/06/14 1331 Last data filed at 06/06/14 0800  Gross per 24 hour  Intake    650 ml  Output      0 ml  Net    650 ml    Exam:   General:  Pt is sleeping, no distress  Cardiovascular: Regular rate and rhythm, S1/S2, no murmurs  Respiratory: Coarse breath sounds, no wheezing  Abdomen: Soft, non tender, non distended, bowel sounds present  Extremities: No edema, pulses DP and  PT palpable bilaterally  Neuro: Grossly nonfocal  Data Reviewed: Basic Metabolic Panel:  Recent Labs Lab 06/02/14 2211  NA 136*  K 3.7  CL 96  CO2 26  GLUCOSE 87  BUN 14  CREATININE 0.66  CALCIUM 9.2   Liver Function Tests: No results found for this basename: AST, ALT, ALKPHOS, BILITOT, PROT, ALBUMIN,  in the last 168 hours No results found for this basename: LIPASE, AMYLASE,  in the last 168 hours No results found for this  basename: AMMONIA,  in the last 168 hours CBC:  Recent Labs Lab 06/02/14 2211  WBC 5.0  HGB 13.1  HCT 37.7*  MCV 91.7  PLT 417*   Cardiac Enzymes: No results found for this basename: CKTOTAL, CKMB, CKMBINDEX, TROPONINI,  in the last 168 hours BNP: No components found with this basename: POCBNP,  CBG: No results found for this basename: GLUCAP,  in the last 168 hours  Recent Results (from the past 240 hour(s))  CULTURE, BLOOD (ROUTINE X 2)     Status: None   Collection Time    06/03/14 12:28 AM      Result Value Ref Range Status   Specimen Description BLOOD RIGHT ARM   Final   Special Requests BOTTLES DRAWN AEROBIC AND ANAEROBIC 10CC EACH   Final   Culture  Setup Time     Final   Value: 06/03/2014 09:24     Performed at [facility]   Culture     Final   Value:        BLOOD CULTURE RECEIVED NO GROWTH TO DATE CULTURE WILL BE HELD FOR 5 DAYS BEFORE ISSUING A FINAL NEGATIVE REPORT     Performed at [facility]   Report Status PENDING   Incomplete  CULTURE, BLOOD (ROUTINE X 2)     Status: None   Collection Time    06/03/14 12:38 AM      Result Value Ref Range Status   Specimen Description BLOOD RIGHT HAND   Final   Special Requests BOTTLES DRAWN AEROBIC AND ANAEROBIC [facility]   Final   Culture  Setup Time     Final   Value: 06/03/2014 09:25     Performed at [facility]   Culture     Final   Value:        BLOOD CULTURE RECEIVED NO GROWTH TO DATE CULTURE WILL BE HELD FOR 5 DAYS BEFORE ISSUING A FINAL NEGATIVE REPORT     Performed at [facility]   Report Status PENDING   Incomplete     Studies: No results found.  Scheduled Meds: . azithromycin  500 mg Oral QHS  . cefTRIAXone (ROCEPHIN)  IV  1 g Intravenous Q24H  . feeding supplement (ENSURE COMPLETE)  237 mL Oral TID WC  . sodium chloride  3 mL Intravenous Q12H   Continuous Infusions:

## 2014-06-06 NOTE — Progress Notes (Signed)
Clinical Social Work Department BRIEF PSYCHOSOCIAL ASSESSMENT 06/06/2014  Patient:  Philip Andrews, Philip Andrews     Account Number:  [id number]     Admit date:  06/04/2014  Clinical Social Worker:  Levie Heritage  Date/Time:  06/06/2014 11:23 AM  Referred by:  Physician  Date Referred:  06/06/2014 Referred for  SNF Placement   Other Referral:   Interview type:  Patient Other interview type:    PSYCHOSOCIAL DATA Living Status:  FAMILY Admitted from facility:   Level of care:   Primary support name:  Osa Craver Primary support relationship to patient:  CHILD, ADULT Degree of support available:   adequate    CURRENT CONCERNS Current Concerns  Post-Acute Placement   Other Concerns:    SOCIAL WORK ASSESSMENT / PLAN Met with Pt to provide information on ALFs.  Pt stated that he wasn't doing well and appeared to be having trouble breathing.  Pt stated that he has lung cancer and that he has difficulty breathing all the time.  Pt's RN present.    Pt stated that he was living with a lady who now will be going into a nursing home because he can no longer care for her.  Pt was exasperated and stated that he just wanted "to go on" and gestured to the street.  CSW asked Pt to clarify his meaning and he stated that he can't care for himself and that he's asked for help.  He exprssed that he wants to try to go to [facility], where a family member works, so he can be cared for.    CSW discussed with Pt that [place] will require that he turn over his check to the SNF, if he does go.  Pt stated, "I don't care about the check.  I just need to move on.  I can't go on like this."    Pt had difficulty controlling his breathing and appeared very uncomfortable.  Pt gave CSW permission to do a SNF search and hopes that [facility] will work out.    CSW thanked Pt for his time.   Assessment/plan status:  Psychosocial Support/Ongoing Assessment of Needs Other assessment/ plan:   Information/referral to  community resources:    PATIENT'S/FAMILY'S RESPONSE TO PLAN OF CARE: Pt was agitated, uncomfortable but was making every effort to be pleasant.  Pt is wanting help upon d/c and is willing to take it in any form.   Bernita Raisin, [place] Social Work [phone]

## 2014-06-07 ENCOUNTER — Ambulatory Visit
Admit: 2014-06-07 | Discharge: 2014-06-07 | Disposition: A | Discharge status: Home / Self Care | Payer: Medicaid Other | Attending: Radiation Oncology | Admitting: Radiation Oncology

## 2014-06-07 NOTE — Progress Notes (Signed)
[facility] Therapy Treatment Record Phone [phone]   Radiation Therapy was administered to Philip Andrews on: 06/07/2014  4:51 PM and was treatment # 1 out of a planned course of 10 treatments.

## 2014-06-07 NOTE — Progress Notes (Addendum)
Patient ID: Philip Andrews, male   DOB: [birth year], 58 y.o.   MRN: [id number] [facility] PROGRESS NOTE  Philip Andrews WUJ:[id number] DOB: [birth year] DOA: 06/02/2014 PCP: No PCP Per Patient  Brief narrative: 58 y.o. 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 CT chest done on this admission showed no changes in left suprahilar mass and bilateral hilar and mediastinal lymphadenopathy. There was however worsening infiltration in lung bases. CXR showed increasing bilateral perihilar mass or infiltration with suggestion of focal developing infiltration in the left lung base. Pt was started on abx for treatment of possible pneumonia. He continues to have chest pain which is likely due to soft tissue nodule (16 mm) seen anterior to the right ventricle (seen on CT chest) consistent with pericardial disease which likely is contributing to patient's chest pain. Plan is for RT today.  Assessment/Plan:   Principal Problem:  Acute respiratory failure with hypoxia / Community acquired pneumonia  Secondary to combination of lung cancer, community acquired pneumonia  CXR and CT chest suggestive of infiltrates in lung bases.  Continue azithromycin and rocephin  Duoneb as needed for shortness of breath  Blood cultures to date are negarive  Oxygen support via [place] to keep O2 saturation above 90% Active Problems:  Chest pain / soft tissue nodule anterior to right ventricle / pericardial disease  Seen on CT chest soft tissue nodule about 16 mm in size anterior to right ventricle c/w pericardial disease and likely contributes to pt complaints of chest pain. Plan is for RT today. Continue dilaudid IV 2 mg every 2 hours PRN for pain control. Has been seen by PT and recommendation was for SNF. Order placed for SW for SNF placement.  Metastatic lung cancer  Appreciate  Dr. Benay Spice seeing the pt; recommendation for palliative RT and hospice referral  Severe protein calorie malnutrition  Evidenced by PO intake <75%, 7.4% body weight loss in one month Continue ensure supplementation    DVT prophylaxis:heparin sub Q while pt is in hospital   Code Status: full code  Family Communication: plan of care discussed with the patient; called pt son but no answer [phone], no VM option) Disposition Plan: to SNF when stable  Consultants:  Oncology (Dr. Benay Spice)  Radiation oncology (Dr. Lisbeth Renshaw) Procedures:  Simulation radiation 06/04/2014; plan for RT on Monday 06/07/2014  Antibiotics:  Azithromycin 06/02/2014 -->  Rocephin 06/03/2014 -->   Philip Lenz, MD  [facility] Pager [phone]  If 7PM-7AM, please contact night-coverage [email or web address] Password [email or web address] 06/07/2014, 11:19 AM   LOS: 5 days    HPI/Subjective: No acute overnight events.  Objective: Filed Vitals:   06/06/14 0530 06/06/14 1431 06/06/14 2056 06/07/14 0549  BP: 106/70 124/53 117/65 102/67  Pulse: 92 92 67 86  Temp: 98.6 F (37 C) 98.8 F (37.1 C) 98.9 F (37.2 C) 98.6 F (37 C)  TempSrc: Oral Oral Oral Oral  Resp: 18 16 20 20   Height:      Weight:    56.8 kg (125 lb 3.5 oz)  SpO2: 95% 95% 95% 93%    Intake/Output Summary (Last 24 hours) at 06/07/14 1119 Last data filed at 06/07/14 0815  Gross per 24 hour  Intake   1130 ml  Output      0 ml  Net   1130 ml    Exam:   General:  Pt  is alert, follows commands appropriately, not in acute distress  Cardiovascular: Regular rate and rhythm, S1/S2, no murmurs  Respiratory: Congested, no wheezing, coarse breath sounds  Abdomen: Soft, non tender, non distended, bowel sounds present  Extremities: No edema, pulses DP and PT palpable bilaterally  Neuro: Grossly nonfocal  Data Reviewed: Basic Metabolic Panel:  Recent Labs Lab 06/02/14 2211  NA 136*  K 3.7  CL 96  CO2 26  GLUCOSE 87  BUN 14  CREATININE 0.66   CALCIUM 9.2   Liver Function Tests: No results found for this basename: AST, ALT, ALKPHOS, BILITOT, PROT, ALBUMIN,  in the last 168 hours No results found for this basename: LIPASE, AMYLASE,  in the last 168 hours No results found for this basename: AMMONIA,  in the last 168 hours CBC:  Recent Labs Lab 06/02/14 2211  WBC 5.0  HGB 13.1  HCT 37.7*  MCV 91.7  PLT 417*   Cardiac Enzymes: No results found for this basename: CKTOTAL, CKMB, CKMBINDEX, TROPONINI,  in the last 168 hours BNP: No components found with this basename: POCBNP,  CBG: No results found for this basename: GLUCAP,  in the last 168 hours  CULTURE, BLOOD (ROUTINE X 2)     Status: None   Collection Time    06/03/14 12:28 AM      Result Value Ref Range Status   Specimen Description BLOOD RIGHT ARM   Final   Value:        BLOOD CULTURE RECEIVED NO GROWTH TO DATE CULTURE WILL BE HELD FOR 5 DAYS BEFORE ISSUING A FINAL NEGATIVE REPORT     Performed at [facility]   Report Status PENDING   Incomplete  CULTURE, BLOOD (ROUTINE X 2)     Status: None   Collection Time    06/03/14 12:38 AM      Result Value Ref Range Status   Specimen Description BLOOD RIGHT HAND   Final   Value:        BLOOD CULTURE RECEIVED NO GROWTH TO DATE CULTURE WILL BE HELD FOR 5 DAYS BEFORE ISSUING A FINAL NEGATIVE REPORT     Performed at [facility]   Report Status PENDING   Incomplete     Studies: No results found.  Scheduled Meds: . azithromycin  500 mg Oral QHS  . cefTRIAXone (ROCEPHIN)  IV  1 g Intravenous Q24H  . feeding supplement (ENSURE COMPLETE)  237 mL Oral TID WC

## 2014-06-07 NOTE — Progress Notes (Signed)
Patient back from the cancer center after his first radiation treatment continue to C/O chest pain on the left side PRN dilaudid given, BP-112/66, HR-96%-RA, EKG done WNL and Dr. Charlies Silvers notified and stated to call her back is pain is not effective with pain medication.  Within 48mins after giving pain medication chest pain got better. Will continue to monitor patient.

## 2014-06-07 NOTE — Progress Notes (Signed)
Physical Therapy Treatment Patient Details Name: Philip Andrews MRN: [id number] DOB: 07/20/1956 Today's Date: 06/07/2014    History of Present Illness 58 yo male admitted with chest pain likely due to lung cancer. Hx of lung cancer withmets, substance abuse.     PT Comments    LOb x2 this session during ambulation-required Min assist to stabilize. Pt tolerated session well. Pt stated he did not feel he could d/c home-considering/requesting SNF placement.   Follow Up Recommendations  SNF (pt considering/requesting)     Equipment Recommendations  None recommended by PT    Recommendations for Other Services       Precautions / Restrictions Precautions Precautions: Fall Restrictions Weight Bearing Restrictions: No    Mobility  Bed Mobility Overal bed mobility: Modified Independent                Transfers Overall transfer level: Modified independent                  Ambulation/Gait Ambulation/Gait assistance: Min assist Ambulation Distance (Feet): 500 Feet Assistive device:  (handrail) Gait Pattern/deviations: Step-through pattern     General Gait Details: Min A to stabilize during LOB x2-pt appeard to trip/have misstep. otherwise, min guard for ambulation.    Stairs            Wheelchair Mobility    Modified Rankin (Stroke Patients Only)       Balance Overall balance assessment: Needs assistance   Sitting balance-Leahy Scale: Normal     Standing balance support: During functional activity Standing balance-Leahy Scale: Good Standing balance comment: LOB x 2 during ambulation-Min assist to stabilize.                     Cognition Arousal/Alertness: Awake/alert Behavior During Therapy: WFL for tasks assessed/performed Overall Cognitive Status: Within Functional Limits for tasks assessed                      Exercises      General Comments General comments (skin integrity, edema, etc.): EO/EC, withstanding of  external exturbations, 360 degree turn, pick up object-all supervision level assist.       Pertinent Vitals/Pain No c/o pain    Home Living                      Prior Function            PT Goals (current goals can now be found in the care plan section) Progress towards PT goals: Progressing toward goals    Frequency  Min 3X/week    PT Plan Discharge plan needs to be updated (pt considering ST rehab placement-doesn't feel he can go home without assistance)    Co-evaluation             End of Session   Activity Tolerance: Patient tolerated treatment well Patient left: in bed;with call bell/phone within reach (sitting EOB)     Time: 5462-7035 PT Time Calculation (min): 8 min  Charges:  $Gait Training: 8-22 mins                    G Codes:      Weston Anna, MPT Pager: [phone]

## 2014-06-07 NOTE — Progress Notes (Signed)
  Radiation Oncology         [phone] ________________________________  Name: Philip Andrews MRN: [id number]  Date: 06/04/2014  DOB: [birth year]  SIMULATION AND TREATMENT PLANNING NOTE  DIAGNOSIS:  Metastatic lung cancer  Site:  Anterior mediastinum  NARRATIVE:  The patient was brought to the [place] suite.  Identity was confirmed.  All relevant records and images related to the planned course of therapy were reviewed.   Written consent to proceed with treatment was confirmed which was freely given after reviewing the details related to the planned course of therapy had been reviewed with the patient.  Then, the patient was set-up in a stable reproducible  supine position for radiation therapy.  CT images were obtained.  Surface markings were placed.    Medically necessary complex treatment device(s) for immobilization:  Wing board.   The CT images were loaded into the planning software.  Then the target and avoidance structures were contoured.  Treatment planning then occurred.  The radiation prescription was entered and confirmed.  A total of 4 complex treatment devices were fabricated which relate to the designed radiation treatment fields. Each of these customized fields/ complex treatment devices will be used on a daily basis during the radiation course. I have requested : 3D Simulation  I have requested a DVH of the following structures: Target, lungs, heart, spinal cord.   PLAN:  The patient will receive 30 Gy in 10 fractions.  ________________________________   Jodelle Gross, MD, PhD

## 2014-06-08 ENCOUNTER — Ambulatory Visit
Admit: 2014-06-08 | Discharge: 2014-06-08 | Disposition: A | Discharge status: Home / Self Care | Payer: Medicaid Other | Attending: Radiation Oncology | Admitting: Radiation Oncology

## 2014-06-08 DIAGNOSIS — G893 Neoplasm related pain (acute) (chronic): Secondary | ICD-10-CM

## 2014-06-08 LAB — BASIC METABOLIC PANEL
BUN: 13 mg/dL (ref 6–23)
CHLORIDE: 91 meq/L — AB (ref 96–112)
CO2: 25 mEq/L (ref 19–32)
Calcium: 9.2 mg/dL (ref 8.4–10.5)
Creatinine, Ser: 0.59 mg/dL (ref 0.50–1.35)
GFR calc non Af Amer: >90 mL/min (ref >90)
Glucose, Bld: 125 mg/dL — ABNORMAL HIGH (ref 70–99)
Potassium: 4.2 mEq/L (ref 3.7–5.3)
SODIUM: 128 meq/L — AB (ref 137–147)

## 2014-06-08 LAB — CBC
HCT: 39.1 % (ref 39.0–52.0)
Hemoglobin: 13.6 g/dL (ref 13.0–17.0)
MCH: 32 pg (ref 26.0–34.0)
MCHC: 34.8 g/dL (ref 30.0–36.0)
MCV: 92 fL (ref 78.0–100.0)
PLATELETS: 366 10*3/uL (ref 150–400)
RBC: 4.25 MIL/uL (ref 4.22–5.81)
RDW: 11.7 % (ref 11.5–15.5)
WBC: 6.9 10*3/uL (ref 4.0–10.5)

## 2014-06-08 NOTE — Progress Notes (Signed)
[facility] Dept Therapy Treatment Record Phone [phone]   Radiation Therapy was administered to Philip Andrews on: 06/08/2014  3:38 PM and was treatment # 2 out of a planned course of 10 treatments.

## 2014-06-08 NOTE — Progress Notes (Signed)
CSW attempting to find placement for patient - he does not qualify for SNF, CSW has faxed information out to [facility] - spoke with [place] @ [place] [facility] [place] facilities are reviewing information. Patient states that if he's not able to go to a facility, he'll just go home. Patient refused to let CSW speak with anyone listed on facesheet - states that he "will not go live with anyone". CSW perceives that patient does not want to feel like a burden on anyone.   CSW will follow-up with ALFs in the morning re: bed offer/availability.   Raynaldo Opitz, [facility] Clinical Social Worker cell #: [phone]

## 2014-06-08 NOTE — Progress Notes (Signed)
Patient ID: Philip Andrews, male   DOB: 09-Jul-1956, 58 y.o.   MRN: [id number] [facility] PROGRESS NOTE  Philip Andrews SNK:[id number] DOB: 1956-05-18 DOA: 06/02/2014 PCP: No PCP Per Patient  Brief narrative: 58 y.o. 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 CT chest done on this admission showed no changes in left suprahilar mass and bilateral hilar and mediastinal lymphadenopathy. There was however worsening infiltration in lung bases. CXR showed increasing bilateral perihilar mass or infiltration with suggestion of focal developing infiltration in the left lung base. Pt was started on abx for treatment of possible pneumonia. He continues to have chest pain which is likely due to soft tissue nodule (16 mm) seen anterior to the right ventricle (seen on CT chest) consistent with pericardial disease which likely is contributing to patient's chest pain. Pt started palliative RT 06/07/2014.   Assessment/Plan:   Principal Problem:  Acute respiratory failure with hypoxia / Community acquired pneumonia  Secondary to combination of lung cancer, community acquired pneumonia  CXR and CT chest suggestive of infiltrates in lung bases. Pt is on azithromycin and rocephin. Continue Duoneb as needed for shortness of breath  Blood cultures to date are negarive  Oxygen support via [place] to keep O2 saturation above 90% Active Problems:  Chest pain / soft tissue nodule anterior to right ventricle / pericardial disease / Metastatic lung cancer  Seen on CT chest soft tissue nodule about 16 mm in size anterior to right ventricle c/w pericardial disease and likely contributes to pt complaints of chest pain.  Started RT 06/07/2014 Continue current pain med regimen with dilaudid IV 2 mg every 2 hours PRN for pain control.  Per oncology, hospice referral recommended; will  ask SW for help with placement  Severe protein calorie malnutrition  Evidenced by PO intake <75%, 7.4% body weight loss in one month  Continue ensure supplementation    DVT prophylaxis:heparin sub Q while pt is in hospital   Code Status: DNR/DNI Family Communication: plan of care discussed with the patient; called pt son but no answer [phone], no VM option)  Disposition Plan: remains inpatient  Consultants:  Oncology (Dr. Benay Spice)  Radiation oncology (Dr. Lisbeth Renshaw) Procedures:  Simulation radiation 06/04/2014; RT 06/07/2014 --> Antibiotics:  Azithromycin 06/02/2014 -->  Rocephin 06/03/2014 -->   Leisa Lenz, MD  [facility] Pager [phone]  If 7PM-7AM, please contact night-coverage [email or web address] Password [email or web address] 06/08/2014, 2:42 PM   LOS: 6 days   HPI/Subjective: No acute overnight events.  Objective: Filed Vitals:   06/07/14 1722 06/07/14 2130 06/08/14 0540 06/08/14 1425  BP: 112/66 110/67 105/66 104/58  Pulse: 98 98 94 92  Temp:  98.6 F (37 C) 98.6 F (37 C) 99.1 F (37.3 C)  TempSrc:  Oral Oral Oral  Resp: 24 20 20 20   Height:      Weight:      SpO2: 96% 95% 94% 96%    Intake/Output Summary (Last 24 hours) at 06/08/14 1442 Last data filed at 06/08/14 0900  Gross per 24 hour  Intake    480 ml  Output      0 ml  Net    480 ml    Exam:   General:  Pt is sleeping this am, no acute distress  Cardiovascular: Regular rate and rhythm, S1/S2, no murmurs  Respiratory: coarse breath sounds, diminished, no wheezing  Abdomen: Soft, non tender, non distended, bowel sounds present  Extremities: Has clubbing present, pulses DP and PT palpable bilaterally  Neuro: No focal neurologic deficits.   Data Reviewed: Basic Metabolic Panel:  Recent Labs Lab 06/02/14 2211 06/08/14 0420  NA 136* 128*  K 3.7 4.2  CL 96 91*  CO2 26 25  GLUCOSE 87 125*  BUN 14 13  CREATININE 0.66 0.59  CALCIUM 9.2 9.2   Liver Function Tests: No results found for  this basename: AST, ALT, ALKPHOS, BILITOT, PROT, ALBUMIN,  in the last 168 hours No results found for this basename: LIPASE, AMYLASE,  in the last 168 hours No results found for this basename: AMMONIA,  in the last 168 hours CBC:  Recent Labs Lab 06/02/14 2211 06/08/14 0420  WBC 5.0 6.9  HGB 13.1 13.6  HCT 37.7* 39.1  MCV 91.7 92.0  PLT 417* 366   Cardiac Enzymes: No results found for this basename: CKTOTAL, CKMB, CKMBINDEX, TROPONINI,  in the last 168 hours BNP: No components found with this basename: POCBNP,  CBG: No results found for this basename: GLUCAP,  in the last 168 hours  Recent Results (from the past 240 hour(s))  CULTURE, BLOOD (ROUTINE X 2)     Status: None   Collection Time    06/03/14 12:28 AM      Result Value Ref Range Status   Specimen Description BLOOD RIGHT ARM   Final   Special Requests BOTTLES DRAWN AEROBIC AND ANAEROBIC 10CC EACH   Final   Culture  Setup Time     Final   Value: 06/03/2014 09:24     Performed at [facility]   Culture     Final   Value:        BLOOD CULTURE RECEIVED NO GROWTH TO DATE CULTURE WILL BE HELD FOR 5 DAYS BEFORE ISSUING A FINAL NEGATIVE REPORT     Performed at [facility]   Report Status PENDING   Incomplete  CULTURE, BLOOD (ROUTINE X 2)     Status: None   Collection Time    06/03/14 12:38 AM      Result Value Ref Range Status   Specimen Description BLOOD RIGHT HAND   Final   Special Requests BOTTLES DRAWN AEROBIC AND ANAEROBIC [facility]   Final   Culture  Setup Time     Final   Value: 06/03/2014 09:25     Performed at [facility]   Culture     Final   Value:        BLOOD CULTURE RECEIVED NO GROWTH TO DATE CULTURE WILL BE HELD FOR 5 DAYS BEFORE ISSUING A FINAL NEGATIVE REPORT     Performed at [facility]   Report Status PENDING   Incomplete     Studies: No results found.  Scheduled Meds: . azithromycin  500 mg Oral QHS  . cefTRIAXone (ROCEPHIN)  IV  1 g Intravenous Q24H  . feeding  supplement (ENSURE COMPLETE)  237 mL Oral TID WC  . sodium chloride  3 mL Intravenous Q12H   Continuous Infusions:

## 2014-06-08 NOTE — Progress Notes (Signed)
IP PROGRESS NOTE  Subjective:   [name] continues to have pain at the left anterior chest. The pain is relieved with narcotic analgesics. He began palliative radiation yesterday.  Objective: Vital signs in last 24 hours: Blood pressure 105/66, pulse 94, temperature 98.6 F (37 C), temperature source Oral, resp. rate 20, height 5\' 9"  (3.903 m), weight 125 lb 3.5 oz (56.8 kg), SpO2 94.00%.  Intake/Output from previous day: 06/29 0701 - 06/30 0700 In: 920 [P.O.:920] Out: -   Physical Exam:   Musculoskeletal: No tenderness at the left anterior chest  Lab Results:  Recent Labs  06/08/14 0420  WBC 6.9  HGB 13.6  HCT 39.1  PLT 366    BMET  Recent Labs  06/08/14 0420  NA 128*  K 4.2  CL 91*  CO2 25  GLUCOSE 125*  BUN 13  CREATININE 0.59  CALCIUM 9.2    Medications: I have reviewed the patient's current medications.  Assessment/Plan:  1. Metastatic adenocarcinoma involving a pericardial effusion, TTF-1 and Napsin-A negative.  Status post pericardial window procedure and bronchoscopy on 10/31/2013. No tumor found at bronchoscopy and the pericardial tissue was negative for malignancy.  Staging PET scan 11/17/2013 with a hypermetabolic 1.6 cm left upper lobe suprahilar nodule with SUV max 9.1; medial to that nodule is a 6 mm nodule not showing any increased metabolic activity; multiple hypermetabolic small mediastinal and hilar lymph nodes bilaterally; additional nodes present in the AP window, prevascular space and both hila; focally increased metabolic activity within the soft tissues of the right back with SUV max 6.7.  Status post palliative radiation to a soft tissue metastasis at the right back 11/26/2013 through 12/15/2013.  Restaging CT chest and abdomen 02/18/2014. Interval enlargement of the left upper lobe pulmonary nodule; enlargement of pericardial lesions overlying the heart; enlargement of bilateral hilar and mediastinal lymph nodes; ill-defined lesion in  the liver not identified on prior studies. Previously suspected soft tissue lesion in the paraspinous muscular posterior to the right kidney not confidently identified on current study.  Cycle 1 Taxol/carboplatin 03/04/2014.  Cycle 2 Taxol/carboplatin 03/25/2014. Restaging CT 05/12/2014 with mild progression of the chest lymphadenopathy 2. Respiratory failure/cardiac tamponade secondary to #1. Resolved. 3. History of polysubstance abuse. 4. History of Acute renal failure. Resolved. 5. Mild normocytic anemia. 6. Left hilar mass on the noncontrast chest CT 10/24/2013. 7. Right low back/flank pain. Likely secondary to a soft tissue metastasis at the right back. He completed palliative radiation 11/26/2013 through 12/15/2013. Resolved.  8. Repeat hospital admission with left anterior chest pain-this appears to be related to a mass at the left anterior chest wall/pericardial  Initiation of palliative radiation 06/07/2014 9. Parenchymal infiltrates in the lower lungs-potentially nodular tumor spread   [name] appears stable. He is completing palliative radiation to the left chest. He agrees to a [facility] referral. He stated that he would like to be placed in a nursing facility as opposed to returning home. He does not have transportation for radiation.  I discussed CPR and ACLS issues with [name]. He agrees to a no CODE BLUE status.  Recommendations: 1. continue palliative radiation to the chest 2. Narcotic analgesics for pain 3. [facility] hospice referral 4. no CODE BLUE 5. placement with hospice       LOS: 6 days   [place]  06/08/2014, 8:25 AM

## 2014-06-09 ENCOUNTER — Encounter: Payer: Self-pay | Admitting: Oncology

## 2014-06-09 ENCOUNTER — Ambulatory Visit
Admit: 2014-06-09 | Discharge: 2014-06-09 | Disposition: A | Discharge status: Home / Self Care | Payer: Medicaid Other | Attending: Radiation Oncology | Admitting: Radiation Oncology

## 2014-06-09 LAB — CULTURE, BLOOD (ROUTINE X 2)
Culture: NO GROWTH
Culture: NO GROWTH

## 2014-06-09 NOTE — Progress Notes (Signed)
Put son's fmla form on nurse's desk.

## 2014-06-09 NOTE — Progress Notes (Addendum)
Patient ID: Philip Andrews, male   DOB: [birth year], 58 y.o.   MRN: [id number] [facility] PROGRESS NOTE  Philip Andrews NOB:[id number] DOB: 09/26/1956 DOA: 06/02/2014 PCP: No PCP Per Patient  Brief narrative: 59 y.o. 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 CT chest done on this admission showed no changes in left suprahilar mass and bilateral hilar and mediastinal lymphadenopathy. There was however worsening infiltration in lung bases. CXR showed increasing bilateral perihilar mass or infiltration with suggestion of focal developing infiltration in the left lung base. Pt was started on abx for treatment of possible pneumonia. He continues to have chest pain which is likely due to soft tissue nodule (16 mm) seen anterior to the right ventricle (seen on CT chest) consistent with pericardial disease which likely is contributing to patient's chest pain. Pt started palliative RT 06/07/2014.   Assessment/Plan:   Acute respiratory failure with hypoxia / Community acquired pneumonia  Secondary to combination of lung cancer, community acquired pneumonia  CXR and CT chest suggestive of infiltrates in lung bases. Pt is on azithromycin and rocephin. Continue Duoneb as needed for shortness of breath  Blood cultures to date are negarive  Oxygen support via [place] to keep O2 saturation above 90%  Chest pain / soft tissue nodule anterior to right ventricle / pericardial disease / Metastatic lung cancer  Seen on CT chest soft tissue nodule about 16 mm in size anterior to right ventricle c/w pericardial disease and likely contributes to pt complaints of chest pain.  Started RT 06/07/2014 Continue current pain med regimen with dilaudid IV 2 mg every 2 hours PRN for pain control.  Per oncology, hospice referral recommended; will ask SW for help with placement    Severe protein calorie malnutrition  Evidenced by PO intake <75%, 7.4% body weight loss in one month  Continue ensure supplementation   DVT prophylaxis:heparin sub Q while pt is in hospital   Hyponatremia - ?SIADH, monitor. No IVF.  Code Status: DNR/DNI Family Communication: plan of care discussed with the patient;  Disposition Plan: remains inpatient  Consultants:  Oncology (Dr. Benay Spice)  Radiation oncology (Dr. Lisbeth Renshaw)  Procedures:  Simulation radiation 06/04/2014; RT 06/07/2014 -->  Antibiotics:  Azithromycin 06/02/2014 -->  Rocephin 06/03/2014 -->   LOS: 7 days   HPI/Subjective: No acute overnight events.  Objective: Filed Vitals:   06/08/14 0540 06/08/14 1425 06/08/14 2222 06/09/14 0502  BP: 105/66 104/58 101/65 111/58  Pulse: 94 92 93 100  Temp: 98.6 F (37 C) 99.1 F (37.3 C) 98.5 F (36.9 C) 98 F (36.7 C)  TempSrc: Oral Oral Oral Oral  Resp: 20 20 20 20   Height:      Weight:      SpO2: 94% 96% 95% 96%    Intake/Output Summary (Last 24 hours) at 06/09/14 1254 Last data filed at 06/09/14 0857  Gross per 24 hour  Intake   1300 ml  Output      0 ml  Net   1300 ml    Exam:   General:  NAD  Cardiovascular: Regular rate and rhythm, S1/S2, no murmurs  Respiratory: coarse breath sounds, diminished, no wheezing   Abdomen: Soft, non tender, non distended, bowel sounds present  Extremities: Has clubbing present, pulses DP and PT palpable bilaterally  Neuro: No focal neurologic deficits.   Data Reviewed: Basic Metabolic Panel:  Recent Labs Lab 06/02/14 2211 06/08/14 0420  NA 136* 128*  K 3.7 4.2  CL 96 91*  CO2 26 25  GLUCOSE 87 125*  BUN 14 13  CREATININE 0.66 0.59  CALCIUM 9.2 9.2   CBC:  Recent Labs Lab 06/02/14 2211 06/08/14 0420  WBC 5.0 6.9  HGB 13.1 13.6  HCT 37.7* 39.1  MCV 91.7 92.0  PLT 417* 366   Recent Results (from the past 240 hour(s))  CULTURE, BLOOD (ROUTINE X 2)     Status: None   Collection Time     06/03/14 12:28 AM      Result Value Ref Range Status   Specimen Description BLOOD RIGHT ARM   Final   Special Requests BOTTLES DRAWN AEROBIC AND ANAEROBIC 10CC EACH   Final   Culture  Setup Time     Final   Value: 06/03/2014 09:24     Performed at [facility]   Culture     Final   Value: NO GROWTH 5 DAYS     Performed at [facility]   Report Status 06/09/2014 FINAL   Final  CULTURE, BLOOD (ROUTINE X 2)     Status: None   Collection Time    06/03/14 12:38 AM      Result Value Ref Range Status   Specimen Description BLOOD RIGHT HAND   Final   Special Requests BOTTLES DRAWN AEROBIC AND ANAEROBIC [facility]   Final   Culture  Setup Time     Final   Value: 06/03/2014 09:25     Performed at [facility]   Culture     Final   Value: NO GROWTH 5 DAYS     Performed at [facility]   Report Status 06/09/2014 FINAL   Final    Scheduled Meds: . azithromycin  500 mg Oral QHS  . cefTRIAXone (ROCEPHIN)  IV  1 g Intravenous Q24H  . feeding supplement (ENSURE COMPLETE)  237 mL Oral TID WC  . sodium chloride  3 mL Intravenous Q12H    Continuous Infusions:   Philip Board, MD, PhD [facility] Pager [phone]  Time spent: 25 minutes  If 7PM-7AM, please contact night-coverage [email or web address] Password [email or web address]

## 2014-06-09 NOTE — Progress Notes (Signed)
[facility] Therapy Treatment Record Phone [phone]   Radiation Therapy was administered to Philip Andrews on: 06/09/2014  10:47 AM and was treatment # 3 out of a planned course of 10 treatments.

## 2014-06-09 NOTE — Progress Notes (Signed)
Physical Therapy Treatment Patient Details Name: Philip Andrews MRN: [id number] DOB: 14-Nov-1956 Today's Date: 06/09/2014    History of Present Illness 58 yo male admitted with chest pain likely due to lung cancer. Hx of lung cancer withmets, substance abuse.     PT Comments    Pt was supervision level for ambulation this session-mostly due to missteps/tripping-? Due to flip flop shoes? Pt states he is still unsure of his d/c plan at this time-pt previously reported he did not feel he could manage safely at home.  RN reports pt does not have a home to d/c to. Will d/c pt from PT at this time-pt feels he is now at his baseline with respect to mobility.   Follow Up Recommendations  No PT follow up (pt is still unsure of d/c plan).      Equipment Recommendations  None recommended by PT    Recommendations for Other Services       Precautions / Restrictions Precautions Precautions: None Restrictions Weight Bearing Restrictions: No    Mobility  Bed Mobility Overal bed mobility: Independent                Transfers Overall transfer level: Independent                  Ambulation/Gait Ambulation/Gait assistance: Supervision Ambulation Distance (Feet): 500 Feet Assistive device: None Gait Pattern/deviations: Step-through pattern     General Gait Details: supervision level at times due to missteps/trips - ? flip flop shoes?  Pt tolerated activity well    Stairs            Wheelchair Mobility    Modified Rankin (Stroke Patients Only)       Balance                                    Cognition                            Exercises      General Comments        Pertinent Vitals/Pain Pt denies pain    Home Living                      Prior Function            PT Goals (current goals can now be found in the care plan section) Progress towards PT goals: Goals met/education completed, patient discharged from PT  (pt feels he is at his baseline)    Frequency  Min 3X/week    PT Plan Discharge plan needs to be updated    Co-evaluation             End of Session   Activity Tolerance: Patient tolerated treatment well Patient left: in bed;with call bell/phone within reach     Time: 6301-6010 PT Time Calculation (min): 8 min  Charges:  $Gait Training: 8-22 mins                    G Codes:      Weston Anna, MPT Pager: [phone]

## 2014-06-10 ENCOUNTER — Encounter (HOSPITAL_COMMUNITY): Payer: Medicaid Other | Admitting: Certified Registered Nurse Anesthetist

## 2014-06-10 ENCOUNTER — Encounter (HOSPITAL_COMMUNITY): Payer: Self-pay | Admitting: Certified Registered Nurse Anesthetist

## 2014-06-10 ENCOUNTER — Encounter: Payer: Medicaid Other | Admitting: Radiation Oncology

## 2014-06-10 ENCOUNTER — Encounter (HOSPITAL_COMMUNITY)
Admission: EM | Disposition: A | Discharge status: Hospice | Payer: Self-pay | Source: Home / Self Care | Attending: Internal Medicine

## 2014-06-10 ENCOUNTER — Encounter: Payer: Self-pay | Admitting: Radiation Oncology

## 2014-06-10 ENCOUNTER — Inpatient Hospital Stay (HOSPITAL_COMMUNITY): Payer: Medicaid Other

## 2014-06-10 ENCOUNTER — Ambulatory Visit: Payer: Medicaid Other

## 2014-06-10 ENCOUNTER — Inpatient Hospital Stay (HOSPITAL_COMMUNITY): Payer: Medicaid Other | Admitting: Certified Registered Nurse Anesthetist

## 2014-06-10 DIAGNOSIS — M79609 Pain in unspecified limb: Secondary | ICD-10-CM

## 2014-06-10 DIAGNOSIS — I998 Other disorder of circulatory system: Secondary | ICD-10-CM | POA: Diagnosis present

## 2014-06-10 DIAGNOSIS — I82819 Embolism and thrombosis of superficial veins of unspecified lower extremities: Secondary | ICD-10-CM

## 2014-06-10 DIAGNOSIS — Z01810 Encounter for preprocedural cardiovascular examination: Secondary | ICD-10-CM

## 2014-06-10 DIAGNOSIS — I824Y9 Acute embolism and thrombosis of unspecified deep veins of unspecified proximal lower extremity: Secondary | ICD-10-CM

## 2014-06-10 DIAGNOSIS — I70229 Atherosclerosis of native arteries of extremities with rest pain, unspecified extremity: Secondary | ICD-10-CM

## 2014-06-10 HISTORY — PX: INTRAOPERATIVE ARTERIOGRAM: SHX5157

## 2014-06-10 HISTORY — PX: EMBOLECTOMY: SHX44

## 2014-06-10 LAB — CBC
HCT: 38.1 % — ABNORMAL LOW (ref 39.0–52.0)
HEMATOCRIT: 38.2 % — AB (ref 39.0–52.0)
Hemoglobin: 13.3 g/dL (ref 13.0–17.0)
Hemoglobin: 13.2 g/dL (ref 13.0–17.0)
MCH: 31.7 pg (ref 26.0–34.0)
MCH: 31.7 pg (ref 26.0–34.0)
MCHC: 34.8 g/dL (ref 30.0–36.0)
MCHC: 34.6 g/dL (ref 30.0–36.0)
MCV: 91 fL (ref 78.0–100.0)
MCV: 91.6 fL (ref 78.0–100.0)
Platelets: 341 10*3/uL (ref 150–400)
Platelets: 316 10*3/uL (ref 150–400)
RBC: 4.2 MIL/uL — AB (ref 4.22–5.81)
RBC: 4.16 MIL/uL — ABNORMAL LOW (ref 4.22–5.81)
RDW: 11.3 % — ABNORMAL LOW (ref 11.5–15.5)
RDW: 11.5 % (ref 11.5–15.5)
WBC: 4.9 10*3/uL (ref 4.0–10.5)
WBC: 5.9 10*3/uL (ref 4.0–10.5)

## 2014-06-10 LAB — BASIC METABOLIC PANEL
ANION GAP: 9 (ref 5–15)
BUN: 9 mg/dL (ref 6–23)
CHLORIDE: 93 meq/L — AB (ref 96–112)
CO2: 30 meq/L (ref 19–32)
Calcium: 9.1 mg/dL (ref 8.4–10.5)
Creatinine, Ser: 0.64 mg/dL (ref 0.50–1.35)
GFR calc Af Amer: >90 mL/min (ref >90)
GFR calc non Af Amer: >90 mL/min (ref >90)
Glucose, Bld: 110 mg/dL — ABNORMAL HIGH (ref 70–99)
Potassium: 4 mEq/L (ref 3.7–5.3)
Sodium: 132 mEq/L — ABNORMAL LOW (ref 137–147)

## 2014-06-10 LAB — CREATININE, SERUM
CREATININE: 0.54 mg/dL (ref 0.50–1.35)
GFR calc Af Amer: >90 mL/min (ref >90)

## 2014-06-10 SURGERY — EMBOLECTOMY
Anesthesia: General | Site: Leg Upper | Laterality: Right

## 2014-06-10 MED ORDER — HEPARIN SODIUM (PORCINE) 1000 UNIT/ML IJ SOLN
INTRAMUSCULAR | Status: DC | PRN
  Administered 2014-06-10: 5000 [IU] via INTRAVENOUS

## 2014-06-10 MED ORDER — IOHEXOL 300 MG/ML  SOLN
INTRAMUSCULAR | Status: DC | PRN
  Administered 2014-06-10: 27 mL via INTRAVENOUS

## 2014-06-10 MED ORDER — PHENYLEPHRINE HCL 10 MG/ML IJ SOLN
INTRAMUSCULAR | Status: DC | PRN
  Administered 2014-06-10 (×2): 80 ug via INTRAVENOUS

## 2014-06-10 MED ORDER — PROMETHAZINE HCL 25 MG/ML IJ SOLN: 6.2500 mg | INTRAMUSCULAR | Status: DC | PRN

## 2014-06-10 MED ORDER — PANTOPRAZOLE SODIUM 40 MG PO TBEC
40.0000 mg | DELAYED_RELEASE_TABLET | Freq: Every day | ORAL | Status: DC
  Administered 2014-06-10 – 2014-06-11 (×2): 40 mg via ORAL
  Filled 2014-06-10: qty 1

## 2014-06-10 MED ORDER — LIDOCAINE HCL (CARDIAC) 20 MG/ML IV SOLN
INTRAVENOUS | Status: DC | PRN
  Administered 2014-06-10: 50 mg via INTRAVENOUS

## 2014-06-10 MED ORDER — FENTANYL CITRATE 0.05 MG/ML IJ SOLN
INTRAMUSCULAR | Status: DC | PRN
  Administered 2014-06-10 (×2): 100 ug via INTRAVENOUS

## 2014-06-10 MED ORDER — HYDRALAZINE HCL 20 MG/ML IJ SOLN
10.0000 mg | INTRAMUSCULAR | Status: DC | PRN
  Filled 2014-06-10: qty 0.5

## 2014-06-10 MED ORDER — OXYCODONE HCL 5 MG/5ML PO SOLN
5.0000 mg | Freq: Once | ORAL | Status: DC | PRN
Start: 2014-06-10 — End: 2014-06-10

## 2014-06-10 MED ORDER — POTASSIUM CHLORIDE CRYS ER 20 MEQ PO TBCR
20.0000 meq | EXTENDED_RELEASE_TABLET | Freq: Every day | ORAL | Status: DC | PRN
  Filled 2014-06-10: qty 2

## 2014-06-10 MED ORDER — ONDANSETRON HCL 4 MG/2ML IJ SOLN
INTRAMUSCULAR | Status: DC | PRN
  Administered 2014-06-10: 4 mg via INTRAVENOUS

## 2014-06-10 MED ORDER — OXYCODONE HCL 5 MG PO TABS: 5.0000 mg | ORAL_TABLET | Freq: Once | ORAL | Status: DC | PRN

## 2014-06-10 MED ORDER — LEVOFLOXACIN 500 MG PO TABS
500.0000 mg | ORAL_TABLET | Freq: Every day | ORAL | Status: DC
  Administered 2014-06-11: 500 mg via ORAL
  Filled 2014-06-10 (×2): qty 1

## 2014-06-10 MED ORDER — LACTATED RINGERS IV SOLN
INTRAVENOUS | Status: DC | PRN
  Administered 2014-06-10 (×2): via INTRAVENOUS

## 2014-06-10 MED ORDER — METOPROLOL TARTRATE 1 MG/ML IV SOLN
2.0000 mg | INTRAVENOUS | Status: DC | PRN
  Filled 2014-06-10: qty 5

## 2014-06-10 MED ORDER — ALUM & MAG HYDROXIDE-SIMETH 200-200-20 MG/5ML PO SUSP: 15.0000 mL | ORAL | Status: DC | PRN

## 2014-06-10 MED ORDER — HYDROMORPHONE HCL PF 1 MG/ML IJ SOLN
0.2500 mg | INTRAMUSCULAR | Status: DC | PRN
  Administered 2014-06-10: 0.5 mg via INTRAVENOUS

## 2014-06-10 MED ORDER — PROTAMINE SULFATE 10 MG/ML IV SOLN
INTRAVENOUS | Status: DC | PRN
  Administered 2014-06-10 (×2): 10 mg via INTRAVENOUS

## 2014-06-10 MED ORDER — ROCURONIUM BROMIDE 100 MG/10ML IV SOLN
INTRAVENOUS | Status: DC | PRN
  Administered 2014-06-10: 35 mg via INTRAVENOUS

## 2014-06-10 MED ORDER — ACETAMINOPHEN 650 MG RE SUPP: 325.0000 mg | RECTAL | Status: DC | PRN

## 2014-06-10 MED ORDER — DOCUSATE SODIUM 100 MG PO CAPS
100.0000 mg | ORAL_CAPSULE | Freq: Every day | ORAL | Status: DC
  Administered 2014-06-11: 100 mg via ORAL
  Filled 2014-06-10: qty 1

## 2014-06-10 MED ORDER — NEOSTIGMINE METHYLSULFATE 10 MG/10ML IV SOLN
INTRAVENOUS | Status: DC | PRN
  Administered 2014-06-10: 4 mg via INTRAVENOUS

## 2014-06-10 MED ORDER — ENOXAPARIN SODIUM 40 MG/0.4ML ~~LOC~~ SOLN
40.0000 mg | SUBCUTANEOUS | Status: DC
  Administered 2014-06-10: 40 mg via SUBCUTANEOUS
  Filled 2014-06-10 (×2): qty 0.4

## 2014-06-10 MED ORDER — SODIUM CHLORIDE 0.9 % IR SOLN
Status: DC | PRN
  Administered 2014-06-10: 15:00:00

## 2014-06-10 MED ORDER — ONDANSETRON HCL 4 MG/2ML IJ SOLN: 4.0000 mg | Freq: Four times a day (QID) | INTRAMUSCULAR | Status: DC | PRN

## 2014-06-10 MED ORDER — DEXTROSE 5 % IV SOLN
1.5000 g | Freq: Two times a day (BID) | INTRAVENOUS | Status: AC
  Administered 2014-06-10 – 2014-06-11 (×2): 1.5 g via INTRAVENOUS
  Filled 2014-06-10 (×2): qty 1.5

## 2014-06-10 MED ORDER — GUAIFENESIN-DM 100-10 MG/5ML PO SYRP: 15.0000 mL | ORAL_SOLUTION | ORAL | Status: DC | PRN

## 2014-06-10 MED ORDER — SODIUM CHLORIDE 0.9 % IV SOLN
INTRAVENOUS | Status: DC
  Administered 2014-06-10: 30 mL/h via INTRAVENOUS

## 2014-06-10 MED ORDER — PHENOL 1.4 % MT LIQD: 1.0000 | OROMUCOSAL | Status: DC | PRN

## 2014-06-10 MED ORDER — HYDROMORPHONE HCL PF 1 MG/ML IJ SOLN
INTRAMUSCULAR | Status: AC
  Administered 2014-06-12: 0.5 mg via INTRAVENOUS
  Filled 2014-06-10: qty 1

## 2014-06-10 MED ORDER — DOPAMINE-DEXTROSE 3.2-5 MG/ML-% IV SOLN: 3.0000 ug/kg/min | INTRAVENOUS | Status: DC

## 2014-06-10 MED ORDER — PHENYLEPHRINE HCL 10 MG/ML IJ SOLN
10.0000 mg | INTRAVENOUS | Status: DC | PRN
  Administered 2014-06-10: 25 ug/min via INTRAVENOUS

## 2014-06-10 MED ORDER — PROPOFOL 10 MG/ML IV BOLUS
INTRAVENOUS | Status: DC | PRN
  Administered 2014-06-10: 150 mg via INTRAVENOUS

## 2014-06-10 MED ORDER — GLYCOPYRROLATE 0.2 MG/ML IJ SOLN
INTRAMUSCULAR | Status: DC | PRN
  Administered 2014-06-10: .7 mg via INTRAVENOUS

## 2014-06-10 MED ORDER — ACETAMINOPHEN 325 MG PO TABS
325.0000 mg | ORAL_TABLET | ORAL | Status: DC | PRN
  Administered 2014-06-11: 650 mg via ORAL
  Filled 2014-06-10: qty 2

## 2014-06-10 MED ORDER — LABETALOL HCL 5 MG/ML IV SOLN
10.0000 mg | INTRAVENOUS | Status: DC | PRN
  Filled 2014-06-10: qty 4

## 2014-06-10 MED ORDER — 0.9 % SODIUM CHLORIDE (POUR BTL) OPTIME
TOPICAL | Status: DC | PRN
  Administered 2014-06-10 (×2): 1000 mL

## 2014-06-10 MED ORDER — SODIUM CHLORIDE 0.9 % IV SOLN: 500.0000 mL | Freq: Once | INTRAVENOUS | Status: AC | PRN

## 2014-06-10 SURGICAL SUPPLY — 63 items
ADH SKN CLS APL DERMABOND .7 (GAUZE/BANDAGES/DRESSINGS) ×1
BANDAGE ESMARK 6X9 LF (GAUZE/BANDAGES/DRESSINGS) IMPLANT
BNDG CMPR 9X6 STRL LF SNTH (GAUZE/BANDAGES/DRESSINGS)
BNDG ESMARK 6X9 LF (GAUZE/BANDAGES/DRESSINGS)
CANISTER SUCTION 2500CC (MISCELLANEOUS) ×3 IMPLANT
CATH EMB 3FR 80CM (CATHETERS) ×2 IMPLANT
CATH EMB 4FR 80CM (CATHETERS) ×2 IMPLANT
CATH EMB 5FR 80CM (CATHETERS) IMPLANT
CLIP TI MEDIUM 24 (CLIP) ×3 IMPLANT
CLIP TI WIDE RED SMALL 24 (CLIP) ×3 IMPLANT
COVER SURGICAL LIGHT HANDLE (MISCELLANEOUS) ×3 IMPLANT
CUFF TOURNIQUET SINGLE 18IN (TOURNIQUET CUFF) IMPLANT
CUFF TOURNIQUET SINGLE 24IN (TOURNIQUET CUFF) IMPLANT
CUFF TOURNIQUET SINGLE 34IN LL (TOURNIQUET CUFF) IMPLANT
CUFF TOURNIQUET SINGLE 44IN (TOURNIQUET CUFF) IMPLANT
DERMABOND ADVANCED (GAUZE/BANDAGES/DRESSINGS) ×2
DERMABOND ADVANCED .7 DNX12 (GAUZE/BANDAGES/DRESSINGS) ×1 IMPLANT
DRAIN CHANNEL 15F RND FF W/TCR (WOUND CARE) IMPLANT
DRAPE WARM FLUID 44X44 (DRAPE) ×3 IMPLANT
DRAPE X-RAY CASS 24X20 (DRAPES) ×2 IMPLANT
ELECT REM PT RETURN 9FT ADLT (ELECTROSURGICAL) ×3
ELECTRODE REM PT RTRN 9FT ADLT (ELECTROSURGICAL) ×1 IMPLANT
EVACUATOR SILICONE 100CC (DRAIN) IMPLANT
GLOVE BIO SURGEON STRL SZ 6 (GLOVE) ×2 IMPLANT
GLOVE BIO SURGEON STRL SZ 6.5 (GLOVE) ×3 IMPLANT
GLOVE BIO SURGEON STRL SZ7.5 (GLOVE) ×5 IMPLANT
GLOVE BIO SURGEONS STRL SZ 6.5 (GLOVE) ×3
GLOVE BIOGEL PI IND STRL 6.5 (GLOVE) IMPLANT
GLOVE BIOGEL PI IND STRL 7.0 (GLOVE) IMPLANT
GLOVE BIOGEL PI IND STRL 8 (GLOVE) ×1 IMPLANT
GLOVE BIOGEL PI INDICATOR 6.5 (GLOVE) ×4
GLOVE BIOGEL PI INDICATOR 7.0 (GLOVE) ×2
GLOVE BIOGEL PI INDICATOR 8 (GLOVE) ×4
GLOVE ECLIPSE 6.5 STRL STRAW (GLOVE) ×2 IMPLANT
GOWN STRL REUS W/ TWL LRG LVL3 (GOWN DISPOSABLE) ×3 IMPLANT
GOWN STRL REUS W/ TWL XL LVL3 (GOWN DISPOSABLE) IMPLANT
GOWN STRL REUS W/TWL LRG LVL3 (GOWN DISPOSABLE) ×9
GOWN STRL REUS W/TWL XL LVL3 (GOWN DISPOSABLE) ×3
KIT BASIN OR (CUSTOM PROCEDURE TRAY) ×3 IMPLANT
KIT ROOM TURNOVER OR (KITS) ×3 IMPLANT
LOOP VESSEL MINI RED (MISCELLANEOUS) ×2 IMPLANT
NS IRRIG 1000ML POUR BTL (IV SOLUTION) ×6 IMPLANT
PACK PERIPHERAL VASCULAR (CUSTOM PROCEDURE TRAY) ×3 IMPLANT
PAD ARMBOARD 7.5X6 YLW CONV (MISCELLANEOUS) ×4 IMPLANT
PROBE PENCIL 8 MHZ STRL DISP (MISCELLANEOUS) ×2 IMPLANT
SET COLLECT BLD 21X3/4 12 (NEEDLE) ×2 IMPLANT
SPONGE SURGIFOAM ABS GEL 100 (HEMOSTASIS) IMPLANT
STAPLER VISISTAT (STAPLE) IMPLANT
STOPCOCK 4 WAY LG BORE MALE ST (IV SETS) ×2 IMPLANT
SUT PROLENE 5 0 C 1 24 (SUTURE) ×1 IMPLANT
SUT PROLENE 6 0 BV (SUTURE) ×4 IMPLANT
SUT VIC AB 2-0 CTB1 (SUTURE) ×1 IMPLANT
SUT VIC AB 3-0 SH 27 (SUTURE) ×3
SUT VIC AB 3-0 SH 27X BRD (SUTURE) ×1 IMPLANT
SUT VICRYL 4-0 PS2 18IN ABS (SUTURE) ×3 IMPLANT
SYR 3ML LL SCALE MARK (SYRINGE) ×3 IMPLANT
SYR TB 1ML LUER SLIP (SYRINGE) ×2 IMPLANT
TOWEL OR 17X24 6PK STRL BLUE (TOWEL DISPOSABLE) ×4 IMPLANT
TOWEL OR 17X26 10 PK STRL BLUE (TOWEL DISPOSABLE) ×3 IMPLANT
TRAY FOLEY CATH 16FRSI W/METER (SET/KITS/TRAYS/PACK) IMPLANT
TUBING EXTENTION W/L.L. (IV SETS) ×2 IMPLANT
UNDERPAD 30X30 INCONTINENT (UNDERPADS AND DIAPERS) ×3 IMPLANT
WATER STERILE IRR 1000ML POUR (IV SOLUTION) ×3 IMPLANT

## 2014-06-10 NOTE — Progress Notes (Addendum)
  [facility] Day [facility] Note  Subjective:  Groggy from anesthesia  Filed Vitals:   06/10/14 1605  BP:   Pulse:   Temp: 98 F (36.7 C)  Resp:      Extremities:  + doppler signal right peroneal and PT   Assessment/Plan:  This is a 58 y.o. male who is s/p  1. Right femoral embolectomy  2. Intraoperative arteriogram  3. Vein patch angioplasty of the right common femoral artery   -pt doing well post embolectomy--will transfer to 3S tonight and most likely can be transferred back to [facility] tomorrow to continue treatments. -There was soft embolic material in the common femoral artery and also the deep femoral artery. I did not retrieve any significant embolic debris distally. The material did not appear like acute clot but more like potentially carcinoma. -will start Lovenox for DVT prophylaxis. -Given findings intraoperatively, primary team may decide if pt needs more anticoagulation given the material appeared more likely carcinoma.  Specimen sent to pathology.   Leontine Locket, PA-C 06/10/2014 4:20 PM

## 2014-06-10 NOTE — Progress Notes (Signed)
Department of Radiation Oncology  Phone:  [phone] Fax:        [phone]  Weekly Treatment Note    Name: [name] Date: 06/10/2014 MRN: [id number] DOB: 1956/02/21   Current dose: 9 Gy  Current fraction: 3   MEDICATIONS: No current facility-administered medications for this visit.   No current outpatient prescriptions on file.   Facility-Administered Medications Ordered in Other Visits  Medication Dose Route Frequency Provider Last Rate Last Dose  . feeding supplement (ENSURE COMPLETE) (ENSURE COMPLETE) liquid 237 mL  237 mL Oral TID WC Hazle Coca, RD   237 mL at 06/10/14 0800  . HYDROmorphone (DILAUDID) injection 0.25-0.5 mg  0.25-0.5 mg Intravenous Q5 min PRN Duane Boston, MD      . HYDROmorphone (DILAUDID) injection 1-2 mg  1-2 mg Intravenous Q2H PRN Robbie Lis, MD   2 mg at 06/10/14 1229  . ibuprofen (ADVIL,MOTRIN) tablet 600 mg  600 mg Oral Q8H PRN Etta Quill, DO      . ipratropium-albuterol (DUONEB) 0.5-2.5 (3) MG/3ML nebulizer solution 3 mL  3 mL Nebulization Q6H PRN Etta Quill, DO      . levofloxacin (LEVAQUIN) tablet 500 mg  500 mg Oral Daily Costin Karlyne Greenspan, MD      . ondansetron [employer]) tablet 8 mg  8 mg Oral Q8H PRN Etta Quill, DO      . oxyCODONE (Oxy IR/ROXICODONE) immediate release tablet 5 mg  5 mg Oral Once PRN Duane Boston, MD       Or  . oxyCODONE (ROXICODONE) 5 MG/5ML solution 5 mg  5 mg Oral Once PRN Duane Boston, MD      . oxyCODONE-acetaminophen (PERCOCET/ROXICET) 5-325 MG per tablet 1-2 tablet  1-2 tablet Oral Q4H PRN Ladell Pier, MD   2 tablet at 06/10/14 1150  . promethazine (PHENERGAN) injection 6.25-12.5 mg  6.25-12.5 mg Intravenous Q15 min PRN Duane Boston, MD      . sodium chloride 0.9 % injection 3 mL  3 mL Intravenous Q12H Etta Quill, DO   3 mL at 06/10/14 1011     ALLERGIES: Benadryl   LABORATORY DATA:  Lab Results  Component Value Date   WBC 4.9 06/10/2014   HGB 13.3 06/10/2014   HCT 38.2*  06/10/2014   MCV 91.0 06/10/2014   PLT 341 06/10/2014   Lab Results  Component Value Date   NA 132* 06/10/2014   K 4.0 06/10/2014   CL 93* 06/10/2014   CO2 30 06/10/2014   Lab Results  Component Value Date   ALT 10 05/12/2014   AST 25 05/12/2014   ALKPHOS 101 05/12/2014   BILITOT 0.58 05/12/2014     NARRATIVE: Philip Andrews was seen today for weekly treatment management. The chart was checked and the patient's films were reviewed. The patient is doing fine with treatment. However, he was noted to have decreased pulse within the lower extremity. The patient therefore is being transferred to [place] for further evaluation. We will not treat the patient today.  PHYSICAL EXAMINATION:   No acute distress  ASSESSMENT: The patient is doing satisfactorily with treatment.  PLAN: We will continue with the patient's radiation treatment as planned. We will follow the patient's evaluation at [place] and hopefully resume his treatment early next week. The patient has received 3 out of a planned 8 treatments. His treatment prescription has been altered to try to shorten his treatment.

## 2014-06-10 NOTE — Progress Notes (Signed)
Report called to 2 west at [facility] RN accepting report.

## 2014-06-10 NOTE — Progress Notes (Signed)
Care link given report and Pt transferred via [facility] to [facility].

## 2014-06-10 NOTE — Progress Notes (Signed)
VASCULAR LAB PRELIMINARY  ARTERIAL  ABI completed:    RIGHT    LEFT    PRESSURE WAVEFORM  PRESSURE WAVEFORM  BRACHIAL 129 Triphasic BRACHIAL 130 Triphasic  DP Not audible  DP 127 Triphasic  AT 133 Triphasic     PT 125 Biphasic PT 136 Triphasic    RIGHT LEFT  ABI 1.02 1.05   ABIs and Doppler waveforms indicate normal arterial flow except for the right Dorsalis Pedis which is inaudible.  Difficult to image due to patient thrashing in the bed due to severe pain. Unable to have medication at that time. Duplex scan revealed no evidence of significant stenosis and mild heterogeneous plaque throughout the lower extremities bilaterally. There appears to be an acute embolus noted in the right mid to distal femoral which is not totally occlusive at this time and also in the profunda artery with only a thumping Doppler noted in the profunda artery.  Keelon Zurn, RVS 06/10/2014, 12:48 PM

## 2014-06-10 NOTE — Progress Notes (Signed)
Pt cont to rest, pulses are palpable in rt foot, foot/toes cool prior to application of ice pack. Will cont to montor. Pt states cool ice pack helps the pain. SRP, RN

## 2014-06-10 NOTE — Op Note (Signed)
    NAME: Philip Andrews   MRN: [id number] DOB: 03/09/1956    DATE OF OPERATION: 06/10/2014  PREOP DIAGNOSIS: Right femoral embolus  POSTOP DIAGNOSIS: same  PROCEDURE:  1. Right femoral embolectomy 2. Intraoperative arteriogram 3. Vein patch angioplasty of the right common femoral artery  SURGEON: Judeth Cornfield. Scot Dock, MD, FACS  ASSIST: Leontine Locket, PA  ANESTHESIA: Gen.   EBL: 100 cc  INDICATIONS: Philip Andrews is a 58 y.o. male who developed the acute onset of pain in his right foot. Duplex scan showed clot in the deep femoral artery on the right and also in the superficial femoral artery. [facility].  FINDINGS: There was soft embolic material in the common femoral artery and also the deep femoral artery. I did not retrieve any significant embolic debris distally. The material did not appear like acute clot but more like potentially carcinoma.  TECHNIQUE: The patient was taken to the operating room and received a general anesthetic. The right lower extremity was prepped and draped in usual sterile fashion. A longitudinal incision was made in the right groin. Dissection was carried down to the common femoral artery. The bifurcation into the deep femoral artery was fairly low I had to extend the incision distally. The deep femoral artery branched into 3 branches each of which were controlled separately. The patient was heparinized. The common femoral artery was clamped proximally and then the deep femoral and superficial femoral arteries were controlled. A longitudinal arteriotomy was made in the common femoral artery distally. It was a large amount of soft thrombotic material in the common femoral artery which was retrieved. The catheter was passed proximally and no material was retrieved. I next directed a 3 Fogarty catheter down the individual deep femoral branches and retrieved thrombotic debris and all 3 branches. Multiple passes were made until  no further debris was retrieved. I passed a 4 Fogarty catheter the entire length of the superficial femoral artery multiple times and no clot was retrieved. The arteries were irrigated with saline. A venous branch was harvested and divided after being clipped proximally and distally. The branch was opened longitudinally to be used as a vein patch. The patch was then sewn using continuous 6-0 Prolene suture to close the arteriotomy. At the completion was a good Doppler signal in the anterior tibial and posterior tibial positions. Intraoperative arteriogram was obtained which showed no clot in the distal superficial femoral artery popliteal artery or proximal tibial vessels. The heparin was partially reversed with protamine. Hemostasis was obtained in the wound. The wound was closed the 2 deep layers of 3-0 Vicryl and the skin closed with 4-0 Vicryl. Dermabond was applied. The patient tolerated the procedure well and was transferred to the recovery room in stable condition. All needle and sponge counts were correct.  Deitra Mayo, MD, FACS [facility]  DATE OF DICTATION:   06/10/2014

## 2014-06-10 NOTE — Progress Notes (Signed)
Patient ID: JERMIAH SODERMAN, male   DOB: 1956-07-24, 58 y.o.   MRN: [id number] [facility] PROGRESS NOTE  JULIS HAUBNER QIH:[id number] DOB: 1955-12-27 DOA: 06/02/2014 PCP: No PCP Per Patient  Brief narrative: 58 y.o. 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 CT chest done on this admission showed no changes in left suprahilar mass and bilateral hilar and mediastinal lymphadenopathy. There was however worsening infiltration in lung bases. CXR showed increasing bilateral perihilar mass or infiltration with suggestion of focal developing infiltration in the left lung base. Pt was started on abx for treatment of possible pneumonia. He continues to have chest pain which is likely due to soft tissue nodule (16 mm) seen anterior to the right ventricle (seen on CT chest) consistent with pericardial disease which likely is contributing to patient's chest pain. Pt started palliative RT 06/07/2014.   Assessment/Plan:   Acute right 2/3/4 toes pain - onset last night overnight, excruciating this morning and patient barely able to walk due to pain. Concern for digit ischemia with decreased pulses and cool toes with discoloration, discussed with vascular surgery Dr. Scot Dock for evaluation, need to transfer patient to [facility] as he will not be able to be seen today at [facility]. Glad to transfer back to [place] after he is evaluated and stable, to continue his radiation treatment. Obtain arterial doppler and ABIs  Acute respiratory failure with hypoxia / Community acquired pneumonia  Secondary to combination of lung cancer, community acquired pneumonia  CXR and CT chest suggestive of infiltrates in lung bases. Pt on azithromycin and rocephin, transitioned to levofloxacin 7/2 for 2 additional days. Continue Duoneb as needed for shortness of breath  Blood cultures to date  are negarive  He is stable and on room air.  Chest pain / soft tissue nodule anterior to right ventricle / pericardial disease / Metastatic lung cancer  Seen on CT chest soft tissue nodule about 16 mm in size anterior to right ventricle c/w pericardial disease and likely contributes to pt complaints of chest pain.  Started RT 06/07/2014 Per oncology, hospice referral recommended; will ask SW for help with placement   Severe protein calorie malnutrition  Evidenced by PO intake <75%, 7.4% body weight loss in one month  Continue ensure supplementation   DVT prophylaxis:heparin sub Q  Hyponatremia - ?SIADH, monitor, stable  Code Status: DNR/DNI Family Communication: plan of care discussed with the patient;  Disposition Plan: remains inpatient  Consultants:  Oncology (Dr. Benay Spice)  Radiation oncology (Dr. Lisbeth Renshaw) Vascular (Dr. Scot Dock)  Procedures:  Simulation radiation 06/04/2014; RT 06/07/2014 -->  Antibiotics:  Azithromycin 06/02/2014 --> 7/2 Rocephin 06/03/2014 --> 7/2 Levofloxacin 7/2 plan until 7/4   LOS: 8 days   HPI/Subjective: Overnight with progressive right foot pain specifically complaining about toes 2 3 and 4. Excruciating this morning, unable to walk.  Objective: Filed Vitals:   06/09/14 0502 06/09/14 1347 06/09/14 2207 06/10/14 0448  BP: 111/58 105/74 119/77 108/64  Pulse: 100 97 94 94  Temp: 98 F (36.7 C) 98.2 F (36.8 C) 98.4 F (36.9 C) 97.9 F (36.6 C)  TempSrc: Oral Oral Oral Oral  Resp: 20 20 20 20   Height:      Weight:      SpO2: 96% 98% 99% 92%    Intake/Output Summary (Last 24 hours) at 06/10/14 1053 Last data filed at 06/10/14 [phone]  Gross per 24 hour  Intake    720 ml  Output      0 ml  Net    720 ml    Exam:   General:  NAD  Cardiovascular: Regular rate and rhythm, S1/S2, no murmurs  Respiratory: coarse breath sounds, diminished, no wheezing   Abdomen: Soft, non tender, non distended, bowel sounds present  Extremities: right  toe 2,3,4 cool to touch, darker in color than on the left, severe pain with palpation, weak but palpable pulses on right  Neuro: No focal neurologic deficits.   Data Reviewed: Basic Metabolic Panel:  Recent Labs Lab 06/08/14 0420 06/10/14 0438  NA 128* 132*  K 4.2 4.0  CL 91* 93*  CO2 25 30  GLUCOSE 125* 110*  BUN 13 9  CREATININE 0.59 0.64  CALCIUM 9.2 9.1   CBC:  Recent Labs Lab 06/08/14 0420 06/10/14 0438  WBC 6.9 4.9  HGB 13.6 13.3  HCT 39.1 38.2*  MCV 92.0 91.0  PLT 366 341   Recent Results (from the past 240 hour(s))  CULTURE, BLOOD (ROUTINE X 2)     Status: None   Collection Time    06/03/14 12:28 AM      Result Value Ref Range Status   Specimen Description BLOOD RIGHT ARM   Final   Special Requests BOTTLES DRAWN AEROBIC AND ANAEROBIC 10CC EACH   Final   Culture  Setup Time     Final   Value: 06/03/2014 09:24     Performed at [facility]   Culture     Final   Value: NO GROWTH 5 DAYS     Performed at [facility]   Report Status 06/09/2014 FINAL   Final  CULTURE, BLOOD (ROUTINE X 2)     Status: None   Collection Time    06/03/14 12:38 AM      Result Value Ref Range Status   Specimen Description BLOOD RIGHT HAND   Final   Special Requests BOTTLES DRAWN AEROBIC AND ANAEROBIC [facility]   Final   Culture  Setup Time     Final   Value: 06/03/2014 09:25     Performed at [facility]   Culture     Final   Value: NO GROWTH 5 DAYS     Performed at [facility]   Report Status 06/09/2014 FINAL   Final    Scheduled Meds: . azithromycin  500 mg Oral QHS  . cefTRIAXone (ROCEPHIN)  IV  1 g Intravenous Q24H  . feeding supplement (ENSURE COMPLETE)  237 mL Oral TID WC  . sodium chloride  3 mL Intravenous Q12H    Marzetta Board, MD [facility] Pager [phone]  Time spent: 35 minutes  If 7PM-7AM, please contact night-coverage [email or web address] Password [email or web address]

## 2014-06-10 NOTE — Progress Notes (Signed)
Pt complaining of intense pain in right foot--rt 2/3/4 toes--noticed slight discoloration cool to touch in toes, blister area noted in the bottom tip ot toes.Pt states feels like his feet are on fire-in burning and pain.  Placed pt foot in cool water. NP notified--pain med administered and cool compress ice pack applied. Pt became calm after pain med administered and ice pack. SRP, RN

## 2014-06-10 NOTE — Anesthesia Postprocedure Evaluation (Signed)
Anesthesia Post Note  Patient: Philip Andrews  Procedure(s) Performed: Procedure(s) (LRB): RIGHT FEMORAL THROMBECTOMY with profundaplasty with vein patch  (Right) INTRA OPERATIVE ARTERIOGRAM (Right)  Anesthesia type: general  Patient location: PACU  Post pain: Pain level controlled  Post assessment: Patient's Cardiovascular Status Stable  Post vital signs: Reviewed and stable  Level of consciousness: sedated  Complications: No apparent anesthesia complications

## 2014-06-10 NOTE — Anesthesia Procedure Notes (Signed)
Procedure Name: Intubation Date/Time: 06/10/2014 2:23 PM Performed by: Eligha Bridegroom Pre-anesthesia Checklist: Emergency Drugs available, Patient identified, Timeout performed and Suction available Patient Re-evaluated:Patient Re-evaluated prior to inductionOxygen Delivery Method: Circle system utilized Preoxygenation: Pre-oxygenation with 100% oxygen Intubation Type: IV induction Ventilation: Mask ventilation without difficulty and Oral airway inserted - appropriate to patient size Laryngoscope Size: Mac and 4 Grade View: Grade I Tube type: Oral Tube size: 7.5 mm Number of attempts: 1 Airway Equipment and Method: Stylet Placement Confirmation: ETT inserted through vocal cords under direct vision,  breath sounds checked- equal and bilateral and positive ETCO2 Secured at: 22 cm Tube secured with: Tape Dental Injury: Teeth and Oropharynx as per pre-operative assessment

## 2014-06-10 NOTE — Transfer of Care (Signed)
Immediate Anesthesia Transfer of Care Note  Patient: Philip Andrews  Procedure(s) Performed: Procedure(s): RIGHT FEMORAL THROMBECTOMY with profundaplasty with vein patch  (Right) INTRA OPERATIVE ARTERIOGRAM (Right)  Patient Location: PACU  Anesthesia Type:General  Level of Consciousness: awake and alert   Airway & Oxygen Therapy: Patient Spontanous Breathing and Patient connected to nasal cannula oxygen  Post-op Assessment: Report given to PACU RN and Post -op Vital signs reviewed and stable  Post vital signs: Reviewed and stable  Complications: No apparent anesthesia complications

## 2014-06-10 NOTE — Anesthesia Preprocedure Evaluation (Signed)
Anesthesia Evaluation    History of Anesthesia Complications Negative for: history of anesthetic complications  Airway Mallampati: II TM Distance: >3 FB Neck ROM: Full    Dental  (+) Edentulous Upper, Poor Dentition, Dental Advisory Given   Pulmonary COPDCurrent Smoker,    Pulmonary exam normal       Cardiovascular negative cardio ROS      Neuro/Psych    GI/Hepatic Neg liver ROS, PUD,   Endo/Other  negative endocrine ROS  Renal/GU Renal disease     Musculoskeletal   Abdominal   Peds  Hematology negative hematology ROS (+)   Anesthesia Other Findings   Reproductive/Obstetrics                           Anesthesia Physical Anesthesia Plan  ASA: III  Anesthesia Plan: General   Post-op Pain Management:    Induction: Intravenous  Airway Management Planned: Oral ETT  Additional Equipment:   Intra-op Plan:   Post-operative Plan: Possible Post-op intubation/ventilation  Informed Consent: I have reviewed the patients History and Physical, chart, labs and discussed the procedure including the risks, benefits and alternatives for the proposed anesthesia with the patient or authorized representative who has indicated his/her understanding and acceptance.   Dental advisory given  Plan Discussed with: CRNA, Anesthesiologist and Surgeon  Anesthesia Plan Comments:         Anesthesia Quick Evaluation

## 2014-06-10 NOTE — Consult Note (Signed)
[facility] [place]  Patient name: Philip Andrews MRN: [id number] DOB: 1955/12/23 Sex: male  REASON FOR CONSULT: Ischemic right lower extremity  HPI: Philip Andrews is a 58 y.o. male who was admitted to [facility] on 06/03/2014 with chest pain. The patient has a history of metastatic adenocarcinoma of the lung. The patient developed the acute onset of pain in his right second,third,and fourth toes. He had a duplex scan done today which showed clot in the deep femoral artery on the right and also in the mid femoral artery. [facility].  He denies any history of claudication, rest pain, or nonhealing ulcers. He denies any history of atrial fibrillation or recent myocardial infarction.  His past medical history is also significant for severe protein calorie malnutrition.  Past Medical History  Diagnosis Date  . Substance abuse     per H&P  . Lung nodules   . Lung nodules   . Gastric ulcer   . History of radiation therapy 11/26/13-12/15/13    30 gray to right lower posterior chest wall mass  . Metastatic adenocarcinoma 02/23/2014   History reviewed. No pertinent family history.  He states he does have a history of cardiac disease on both sides and his family.  SOCIAL HISTORY: History  Substance Use Topics  . Smoking status: Current Every Day Smoker -- 0.04 packs/day for 44 years    Types: Cigarettes  . Smokeless tobacco: Not on file  . Alcohol Use: 1.2 oz/week    2 Cans of beer per week   He is divorced. He has 2 children. He has been smoking for 45 years. He had smoked up to 2 packs per day but is now cut back significantly.  Allergies  Allergen Reactions  . Benadryl [Diphenhydramine Hcl]     Pt states he just knows he's allergic    Current Facility-Administered Medications  Medication Dose Route Frequency Provider Last Rate Last Dose  . feeding supplement (ENSURE COMPLETE) (ENSURE COMPLETE)  liquid 237 mL  237 mL Oral TID WC Hazle Coca, RD   237 mL at 06/10/14 0800  . HYDROmorphone (DILAUDID) injection 1-2 mg  1-2 mg Intravenous Q2H PRN Robbie Lis, MD   2 mg at 06/10/14 1229  . ibuprofen (ADVIL,MOTRIN) tablet 600 mg  600 mg Oral Q8H PRN Etta Quill, DO      . ipratropium-albuterol (DUONEB) 0.5-2.5 (3) MG/3ML nebulizer solution 3 mL  3 mL Nebulization Q6H PRN Etta Quill, DO      . levofloxacin (LEVAQUIN) tablet 500 mg  500 mg Oral Daily Costin Karlyne Greenspan, MD      . ondansetron [facility]) tablet 8 mg  8 mg Oral Q8H PRN Etta Quill, DO      . oxyCODONE-acetaminophen (PERCOCET/ROXICET) 5-325 MG per tablet 1-2 tablet  1-2 tablet Oral Q4H PRN Ladell Pier, MD   2 tablet at 06/10/14 1150  . sodium chloride 0.9 % injection 3 mL  3 mL Intravenous Q12H Etta Quill, DO   3 mL at 06/10/14 1011   REVIEW OF SYSTEMS: Valu.Nieves ] denotes positive finding; [  ] denotes negative finding CARDIOVASCULAR:  Valu.Nieves ] chest pain   [ ]  chest pressure   [ ]  palpitations   [ ]  orthopnea   Valu.Nieves ] dyspnea on exertion   [ ]  claudication   Valu.Nieves ] rest pain   [ ]  DVT   [ ]  phlebitis  PULMONARY:   [ ]  productive cough   [ ]  asthma   [ ]  wheezing NEUROLOGIC:   [ ]  weakness  [ ]  paresthesias  [ ]  aphasia  [ ]  amaurosis  [ ]  dizziness HEMATOLOGIC:   [ ]  bleeding problems   [ ]  clotting disorders MUSCULOSKELETAL:  [ ]  joint pain   [ ]  joint swelling [ ]  leg swelling GASTROINTESTINAL: [ ]   blood in stool  [ ]   hematemesis GENITOURINARY:  [ ]   dysuria  [ ]   hematuria PSYCHIATRIC:  [ ]  history of major depression INTEGUMENTARY:  [ ]  rashes  [ ]  ulcers CONSTITUTIONAL:  [ ]  fever   [ ]  chills  PHYSICAL EXAM: Filed Vitals:   06/09/14 1347 06/09/14 2207 06/10/14 0448 06/10/14 1258  BP: 105/74 119/77 108/64 100/62  Pulse: 97 94 94 70  Temp: 98.2 F (36.8 C) 98.4 F (36.9 C) 97.9 F (36.6 C) 97.4 F (36.3 C)  TempSrc: Oral Oral Oral   Resp: 20 20 20 20   Height:      Weight:      SpO2: 98% 99% 92% 92%    Body mass index is 18.48 kg/(m^2). GENERAL: The patient is a well-nourished male, in no acute distress. The vital signs are documented above. CARDIOVASCULAR: There is a regular rate and rhythm.  I do not detect carotid bruits. On the right side, which is the symptomatic side, he has a palpable femoral pulse and a palpable dorsalis pedis pulse. Cannot palpate a posterior tibial pulse. On the left side he has a palpable femoral, popliteal, dorsalis pedis, and posterior tibial pulse. He has mild bilateral lower extremity swelling. PULMONARY: There is good air exchange bilaterally without wheezing or rales. ABDOMEN: Soft and non-tender with normal pitched bowel sounds. I do not palpate an abdominal aortic aneurysm. MUSCULOSKELETAL: There are no major deformities or cyanosis. NEUROLOGIC: No focal weakness or paresthesias are detected. SKIN: There are no ulcers or rashes noted. PSYCHIATRIC: The patient has a normal affect.  DATA:  Lab Results  Component Value Date   WBC 4.9 06/10/2014   HGB 13.3 06/10/2014   HCT 38.2* 06/10/2014   MCV 91.0 06/10/2014   PLT 341 06/10/2014   Lab Results  Component Value Date   NA 132* 06/10/2014   K 4.0 06/10/2014   CL 93* 06/10/2014   CO2 30 06/10/2014   Lab Results  Component Value Date   CREATININE 0.64 06/10/2014   ARTERIAL DUPLEX: Duplex scan shows evidence of acute embolus in the mid right femoral artery and proximal deep femoral artery.  MEDICAL ISSUES: This patient developed the acute onset of pain in the right foot this morning and on duplex is found to have clot in the deep femoral artery and mid to distal femoral artery. The etiology of this clot is not clear. Although he denies any history of claudication or rest pain, given his smoking history, he certainly could have underlying peripheral vascular disease that could have resulted in embolic disease or acute occlusion of the femoral artery. He has no obvious reason why he would have had a cardiac embolus.  Although he has a palpable dorsalis pedis pulse on the right he continues to have significant pain and I have recommended right femoral embolectomy. His renal function is normal he will likely require intraoperative arteriography. I have discussed indications for the procedure and the potential complications including, but not limited to, bleeding, infection, and continued ischemia. All his questions were answered and he is agreeable to  proceed with surgery. We will proceed urgently.  [place] [facility] [place] Beeper: [phone]

## 2014-06-10 NOTE — Progress Notes (Signed)
CSW & RNCM spoke with patient & son, Sable Feil at bedside re: discharge planning. CSW had faxed information to [facility], waiting for both to respond whether they can offer a bed. CSW had provided patient's son with information on senior living apartments & HUD information ([place]), son had left messages looking into both. Patient's son lives in [place] and states that eventually, he would like for patient to be close to him there. Patient is still receiving radiation treatments at [facility] through next Monday, 7/13.   Patient transferred to [facility] for urgent embolectomy, CSW Ria Comment aware.   Raynaldo Opitz, [facility] Clinical Social Worker cell #: [phone]

## 2014-06-11 ENCOUNTER — Encounter (HOSPITAL_COMMUNITY): Payer: Self-pay | Admitting: Radiology

## 2014-06-11 ENCOUNTER — Encounter (HOSPITAL_COMMUNITY): Payer: Medicaid Other | Admitting: Anesthesiology

## 2014-06-11 ENCOUNTER — Inpatient Hospital Stay (HOSPITAL_COMMUNITY): Payer: Medicaid Other

## 2014-06-11 ENCOUNTER — Inpatient Hospital Stay (HOSPITAL_COMMUNITY): Payer: Medicaid Other | Admitting: Anesthesiology

## 2014-06-11 ENCOUNTER — Encounter (HOSPITAL_COMMUNITY)
Admission: EM | Disposition: A | Discharge status: Hospice | Payer: Self-pay | Source: Home / Self Care | Attending: Internal Medicine

## 2014-06-11 DIAGNOSIS — R222 Localized swelling, mass and lump, trunk: Secondary | ICD-10-CM

## 2014-06-11 DIAGNOSIS — F1910 Other psychoactive substance abuse, uncomplicated: Secondary | ICD-10-CM

## 2014-06-11 DIAGNOSIS — E871 Hypo-osmolality and hyponatremia: Secondary | ICD-10-CM

## 2014-06-11 HISTORY — PX: THROMBECTOMY FEMORAL ARTERY: SHX6406

## 2014-06-11 LAB — COMPREHENSIVE METABOLIC PANEL WITH GFR
ALT: 7 U/L (ref 0–53)
AST: 14 U/L (ref 0–37)
Albumin: 2.6 g/dL — ABNORMAL LOW (ref 3.5–5.2)
Alkaline Phosphatase: 75 U/L (ref 39–117)
Anion gap: 14 (ref 5–15)
BUN: 9 mg/dL (ref 6–23)
CO2: 24 meq/L (ref 19–32)
Calcium: 8.8 mg/dL (ref 8.4–10.5)
Chloride: 93 meq/L — ABNORMAL LOW (ref 96–112)
Creatinine, Ser: 0.58 mg/dL (ref 0.50–1.35)
GFR calc Af Amer: >90 mL/min
GFR calc non Af Amer: >90 mL/min
Glucose, Bld: 101 mg/dL — ABNORMAL HIGH (ref 70–99)
Potassium: 4 meq/L (ref 3.7–5.3)
Sodium: 131 meq/L — ABNORMAL LOW (ref 137–147)
Total Bilirubin: 0.3 mg/dL (ref 0.3–1.2)
Total Protein: 6.8 g/dL (ref 6.0–8.3)

## 2014-06-11 LAB — CBC
HEMATOCRIT: 36.8 % — AB (ref 39.0–52.0)
HEMOGLOBIN: 12.8 g/dL — AB (ref 13.0–17.0)
MCH: 31.8 pg (ref 26.0–34.0)
MCHC: 34.8 g/dL (ref 30.0–36.0)
MCV: 91.5 fL (ref 78.0–100.0)
Platelets: 324 10*3/uL (ref 150–400)
RBC: 4.02 MIL/uL — ABNORMAL LOW (ref 4.22–5.81)
RDW: 11.4 % — ABNORMAL LOW (ref 11.5–15.5)
WBC: 5.8 10*3/uL (ref 4.0–10.5)

## 2014-06-11 SURGERY — THROMBECTOMY, ARTERY, FEMORAL
Anesthesia: Monitor Anesthesia Care | Site: Leg Lower | Laterality: Right

## 2014-06-11 MED ORDER — LIDOCAINE HCL (CARDIAC) 20 MG/ML IV SOLN
INTRAVENOUS | Status: AC
  Filled 2014-06-11: qty 5

## 2014-06-11 MED ORDER — PHENYLEPHRINE HCL 10 MG/ML IJ SOLN
INTRAMUSCULAR | Status: DC | PRN
  Administered 2014-06-11 (×5): 80 ug via INTRAVENOUS

## 2014-06-11 MED ORDER — PHENYLEPHRINE HCL 10 MG/ML IJ SOLN
10.0000 mg | INTRAVENOUS | Status: DC | PRN
  Administered 2014-06-11: 50 ug/min via INTRAVENOUS

## 2014-06-11 MED ORDER — SURGIFOAM 100 EX MISC
CUTANEOUS | Status: DC | PRN
  Administered 2014-06-11: 23:00:00 via TOPICAL

## 2014-06-11 MED ORDER — ENOXAPARIN SODIUM 60 MG/0.6ML ~~LOC~~ SOLN
60.0000 mg | Freq: Once | SUBCUTANEOUS | Status: AC
  Administered 2014-06-11: 60 mg via SUBCUTANEOUS
  Filled 2014-06-11 (×2): qty 0.6

## 2014-06-11 MED ORDER — ETOMIDATE 2 MG/ML IV SOLN
INTRAVENOUS | Status: AC
  Filled 2014-06-11: qty 10

## 2014-06-11 MED ORDER — CEFAZOLIN SODIUM-DEXTROSE 2-3 GM-% IV SOLR
INTRAVENOUS | Status: DC | PRN
  Administered 2014-06-11: 2 g via INTRAVENOUS

## 2014-06-11 MED ORDER — LACTATED RINGERS IV SOLN
INTRAVENOUS | Status: DC | PRN
  Administered 2014-06-11 (×2): via INTRAVENOUS

## 2014-06-11 MED ORDER — SUFENTANIL CITRATE 50 MCG/ML IV SOLN
INTRAVENOUS | Status: DC | PRN
  Administered 2014-06-11 (×2): 10 ug via INTRAVENOUS

## 2014-06-11 MED ORDER — HEPARIN SODIUM (PORCINE) 1000 UNIT/ML IJ SOLN
INTRAMUSCULAR | Status: AC
  Filled 2014-06-11: qty 1

## 2014-06-11 MED ORDER — SUCCINYLCHOLINE CHLORIDE 20 MG/ML IJ SOLN
INTRAMUSCULAR | Status: DC | PRN
  Administered 2014-06-11: 120 mg via INTRAVENOUS

## 2014-06-11 MED ORDER — MIDAZOLAM HCL 2 MG/2ML IJ SOLN
INTRAMUSCULAR | Status: AC
  Filled 2014-06-11: qty 2

## 2014-06-11 MED ORDER — SODIUM CHLORIDE 0.9 % IR SOLN
Status: DC | PRN
  Administered 2014-06-11: 23:00:00

## 2014-06-11 MED ORDER — 0.9 % SODIUM CHLORIDE (POUR BTL) OPTIME
TOPICAL | Status: DC | PRN
  Administered 2014-06-11: 2000 mL

## 2014-06-11 MED ORDER — SODIUM CHLORIDE 0.9 % IJ SOLN
INTRAMUSCULAR | Status: AC
  Filled 2014-06-11: qty 10

## 2014-06-11 MED ORDER — LIDOCAINE HCL (CARDIAC) 20 MG/ML IV SOLN
INTRAVENOUS | Status: DC | PRN
  Administered 2014-06-11: 100 mg via INTRAVENOUS

## 2014-06-11 MED ORDER — SUFENTANIL CITRATE 50 MCG/ML IV SOLN
INTRAVENOUS | Status: AC
  Filled 2014-06-11: qty 1

## 2014-06-11 MED ORDER — ETOMIDATE 2 MG/ML IV SOLN
INTRAVENOUS | Status: DC | PRN
  Administered 2014-06-11: 18 mg via INTRAVENOUS

## 2014-06-11 MED ORDER — HEPARIN SODIUM (PORCINE) 1000 UNIT/ML IJ SOLN
INTRAMUSCULAR | Status: DC | PRN
  Administered 2014-06-11: 2000 [IU] via INTRAVENOUS
  Administered 2014-06-11: 6000 [IU] via INTRAVENOUS

## 2014-06-11 MED ORDER — PHENYLEPHRINE 40 MCG/ML (10ML) SYRINGE FOR IV PUSH (FOR BLOOD PRESSURE SUPPORT)
PREFILLED_SYRINGE | INTRAVENOUS | Status: AC
  Filled 2014-06-11: qty 10

## 2014-06-11 MED ORDER — ALBUMIN HUMAN 5 % IV SOLN
INTRAVENOUS | Status: DC | PRN
  Administered 2014-06-11: 23:00:00 via INTRAVENOUS

## 2014-06-11 MED ORDER — ONDANSETRON HCL 4 MG/2ML IJ SOLN
INTRAMUSCULAR | Status: AC
  Filled 2014-06-11: qty 2

## 2014-06-11 MED ORDER — IOHEXOL 350 MG/ML SOLN
100.0000 mL | Freq: Once | INTRAVENOUS | Status: AC | PRN
  Administered 2014-06-11: 100 mL via INTRAVENOUS

## 2014-06-11 MED ORDER — DEXAMETHASONE SODIUM PHOSPHATE 4 MG/ML IJ SOLN
INTRAMUSCULAR | Status: DC | PRN
  Administered 2014-06-11: 4 mg via INTRAVENOUS

## 2014-06-11 MED ORDER — ENOXAPARIN SODIUM 60 MG/0.6ML ~~LOC~~ SOLN
60.0000 mg | Freq: Two times a day (BID) | SUBCUTANEOUS | Status: DC
  Filled 2014-06-11: qty 0.6

## 2014-06-11 MED ORDER — ONDANSETRON HCL 4 MG/2ML IJ SOLN
INTRAMUSCULAR | Status: DC | PRN
  Administered 2014-06-11: 4 mg via INTRAVENOUS

## 2014-06-11 MED ORDER — MIDAZOLAM HCL 5 MG/5ML IJ SOLN
INTRAMUSCULAR | Status: DC | PRN
Start: 2014-06-11 — End: 2014-06-12
  Administered 2014-06-11: 2 mg via INTRAVENOUS

## 2014-06-11 MED ORDER — SUCCINYLCHOLINE CHLORIDE 20 MG/ML IJ SOLN
INTRAMUSCULAR | Status: AC
  Filled 2014-06-11: qty 1

## 2014-06-11 SURGICAL SUPPLY — 71 items
BANDAGE ELASTIC 4 VELCRO ST LF (GAUZE/BANDAGES/DRESSINGS) IMPLANT
BANDAGE ESMARK 6X9 LF (GAUZE/BANDAGES/DRESSINGS) IMPLANT
BLADE 10 SAFETY STRL DISP (BLADE) ×3 IMPLANT
BNDG CMPR 9X6 STRL LF SNTH (GAUZE/BANDAGES/DRESSINGS)
BNDG ESMARK 6X9 LF (GAUZE/BANDAGES/DRESSINGS)
CANISTER SUCTION 2500CC (MISCELLANEOUS) ×3 IMPLANT
CATH EMB 3FR 80CM (CATHETERS) ×2 IMPLANT
CATH EMB 4FR 80CM (CATHETERS) ×2 IMPLANT
CATH EMB 5FR 80CM (CATHETERS) ×2 IMPLANT
CLIP TI MEDIUM 24 (CLIP) ×3 IMPLANT
CLIP TI WIDE RED SMALL 24 (CLIP) ×3 IMPLANT
COVER PROBE W GEL 5X96 (DRAPES) ×3 IMPLANT
COVER SURGICAL LIGHT HANDLE (MISCELLANEOUS) ×3 IMPLANT
CUFF TOURNIQUET SINGLE 24IN (TOURNIQUET CUFF) IMPLANT
CUFF TOURNIQUET SINGLE 34IN LL (TOURNIQUET CUFF) IMPLANT
CUFF TOURNIQUET SINGLE 44IN (TOURNIQUET CUFF) IMPLANT
DRAIN CHANNEL 15F RND FF W/TCR (WOUND CARE) IMPLANT
DRAPE C-ARM 42X72 X-RAY (DRAPES) IMPLANT
DRAPE WARM FLUID 44X44 (DRAPE) ×3 IMPLANT
DRSG COVADERM 4X10 (GAUZE/BANDAGES/DRESSINGS) IMPLANT
DRSG COVADERM 4X6 (GAUZE/BANDAGES/DRESSINGS) ×2 IMPLANT
DRSG COVADERM 4X8 (GAUZE/BANDAGES/DRESSINGS) ×4 IMPLANT
ELECT REM PT RETURN 9FT ADLT (ELECTROSURGICAL) ×3
ELECTRODE REM PT RTRN 9FT ADLT (ELECTROSURGICAL) ×1 IMPLANT
EVACUATOR SILICONE 100CC (DRAIN) IMPLANT
GLOVE BIO SURGEON STRL SZ 6.5 (GLOVE) ×2 IMPLANT
GLOVE BIO SURGEON STRL SZ7 (GLOVE) ×7 IMPLANT
GLOVE BIO SURGEONS STRL SZ 6.5 (GLOVE) ×2
GLOVE BIOGEL PI IND STRL 6.5 (GLOVE) IMPLANT
GLOVE BIOGEL PI IND STRL 7.5 (GLOVE) ×1 IMPLANT
GLOVE BIOGEL PI INDICATOR 6.5 (GLOVE) ×6
GLOVE BIOGEL PI INDICATOR 7.5 (GLOVE) ×2
GOWN STRL REUS W/ TWL LRG LVL3 (GOWN DISPOSABLE) ×3 IMPLANT
GOWN STRL REUS W/TWL LRG LVL3 (GOWN DISPOSABLE) ×12
INSERT FOGARTY SM (MISCELLANEOUS) IMPLANT
KIT BASIN OR (CUSTOM PROCEDURE TRAY) ×3 IMPLANT
KIT ROOM TURNOVER OR (KITS) ×3 IMPLANT
MARKER GRAFT CORONARY BYPASS (MISCELLANEOUS) IMPLANT
NS IRRIG 1000ML POUR BTL (IV SOLUTION) ×6 IMPLANT
PACK PERIPHERAL VASCULAR (CUSTOM PROCEDURE TRAY) ×3 IMPLANT
PAD ARMBOARD 7.5X6 YLW CONV (MISCELLANEOUS) ×6 IMPLANT
PADDING CAST COTTON 6X4 STRL (CAST SUPPLIES) IMPLANT
PROBE PENCIL 8 MHZ STRL DISP (MISCELLANEOUS) ×2 IMPLANT
SET MICROPUNCTURE 5F STIFF (MISCELLANEOUS) IMPLANT
SPONGE SURGIFOAM ABS GEL 100 (HEMOSTASIS) ×2 IMPLANT
STAPLER VISISTAT 35W (STAPLE) ×2 IMPLANT
STOPCOCK 4 WAY LG BORE MALE ST (IV SETS) IMPLANT
SUT ETHILON 3 0 PS 1 (SUTURE) IMPLANT
SUT GORETEX 5 0 TT13 24 (SUTURE) IMPLANT
SUT GORETEX 6.0 TT13 (SUTURE) IMPLANT
SUT MNCRL AB 4-0 PS2 18 (SUTURE) ×5 IMPLANT
SUT PROLENE 5 0 C 1 24 (SUTURE) ×3 IMPLANT
SUT PROLENE 6 0 BV (SUTURE) ×7 IMPLANT
SUT PROLENE 7 0 BV 1 (SUTURE) ×18 IMPLANT
SUT SILK 2 0 (SUTURE) ×3
SUT SILK 2 0 FS (SUTURE) ×1 IMPLANT
SUT SILK 2-0 18XBRD TIE 12 (SUTURE) IMPLANT
SUT SILK 3 0 (SUTURE) ×3
SUT SILK 3-0 18XBRD TIE 12 (SUTURE) IMPLANT
SUT VIC AB 2-0 CT1 27 (SUTURE) ×6
SUT VIC AB 2-0 CT1 TAPERPNT 27 (SUTURE) ×2 IMPLANT
SUT VIC AB 3-0 SH 27 (SUTURE) ×6
SUT VIC AB 3-0 SH 27X BRD (SUTURE) ×3 IMPLANT
SYR 3ML LL SCALE MARK (SYRINGE) ×4 IMPLANT
SYR TB 1ML LUER SLIP (SYRINGE) ×2 IMPLANT
TOWEL OR 17X24 6PK STRL BLUE (TOWEL DISPOSABLE) ×6 IMPLANT
TOWEL OR 17X26 10 PK STRL BLUE (TOWEL DISPOSABLE) ×6 IMPLANT
TRAY FOLEY CATH 16FRSI W/METER (SET/KITS/TRAYS/PACK) ×3 IMPLANT
TUBING EXTENTION W/L.L. (IV SETS) IMPLANT
UNDERPAD 30X30 INCONTINENT (UNDERPADS AND DIAPERS) ×3 IMPLANT
WATER STERILE IRR 1000ML POUR (IV SOLUTION) ×3 IMPLANT

## 2014-06-11 NOTE — OR Nursing (Signed)
Patient valuables taken to Security by Venia Carbon RN in order to be put in the safe.  Yellow Inventory Sheet and key placed in patient chart.

## 2014-06-11 NOTE — Progress Notes (Signed)
ANTICOAGULATION CONSULT NOTE - Initial Consult  Pharmacy Consult for lovenox Indication: DVT  Allergies  Allergen Reactions  . Benadryl [Diphenhydramine Hcl]     Pt states he just knows he's allergic    Patient Measurements: Height: 5\' 9"  (175.3 cm) Weight: 125 lb 3.5 oz (56.8 kg) IBW/kg (Calculated) : 70.7 Heparin Dosing Weight:   Vital Signs: Temp: 98.9 F (37.2 C) (07/03 1233) Temp src: Oral (07/03 1233) BP: 94/67 mmHg (07/03 1233) Pulse Rate: 102 (07/03 1233)  Labs:  Recent Labs  06/10/14 0438 06/10/14 1925 06/11/14 0319  HGB 13.3 13.2 12.8*  HCT 38.2* 38.1* 36.8*  PLT 341 316 324  CREATININE 0.64 0.54 0.58    Estimated Creatinine Clearance: 79.9 ml/min (by C-G formula based on Cr of 0.58).   Medical History: Past Medical History  Diagnosis Date  . Substance abuse     per H&P  . Lung nodules   . Lung nodules   . Gastric ulcer   . History of radiation therapy 11/26/13-12/15/13    30 gray to right lower posterior chest wall mass  . 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 dexamethasone (DECADRON) 4 MG tablet Take 2.5 tabs (10 mg) at bedtime the night before first chemotherapy and at 6am the day of first chemotherapy.  5 tablet  0  . ibuprofen (ADVIL,MOTRIN) 600 MG tablet Take 1 tablet (600 mg total) by mouth every 8 (eight) hours as needed.  30 tablet  1  . Ipratropium-Albuterol (COMBIVENT) 20-100 MCG/ACT AERS respimat Inhale 1 puff into the lungs every 6 (six) hours as needed for wheezing or shortness of breath.  1 Inhaler  2  . ondansetron (ZOFRAN) 8 MG tablet Take 1 tablet (8 mg total) by mouth every 8 (eight) hours as needed for nausea or vomiting.  20 tablet  1  . oxyCODONE-acetaminophen (PERCOCET/ROXICET) 5-325 MG per tablet Take 2 tablets by mouth every 6 (six) hours as needed for severe pain.  40 tablet  0  . prochlorperazine (COMPAZINE) 10 MG tablet Take 1 tablet (10  mg total) by mouth every 6 (six) hours as needed for nausea or vomiting.  30 tablet  1   Scheduled:  . docusate sodium  100 mg Oral Daily  . feeding supplement (ENSURE COMPLETE)  237 mL Oral TID WC  . levofloxacin  500 mg Oral Daily  . pantoprazole  40 mg Oral Daily  . sodium chloride  3 mL Intravenous Q12H   Infusions:  . sodium chloride Stopped (06/11/14 1013)  . DOPamine      Assessment: 58 yo with metastatic adenocarcinoma who was tx from [facility] for a DVT. He got embolectomy here. Lovenox has been ordered for anticoag. He was on lovenox 40 but it has now change to full dose.  Goal of Therapy:  Anti-Xa level 0.6-1 units/ml 4hrs after LMWH dose given Monitor platelets by anticoagulation protocol: Yes   Plan:   Lovenox 60mg  SQ q12 CBC q3days

## 2014-06-11 NOTE — Progress Notes (Addendum)
    Pt was seen earlier this afternoon and his pain has increased even since then.  He was relatively painfree this AM.  +Drama sign.  Strongly palpable pulse at surgical site.  Cool extremity similar to earlier today.   At this time of night, no other imaging modality available but CTA.  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    - CTA ordered  Adele Barthel, MD [facility]: [phone] Pager: [phone]  06/11/2014, 6:27 PM   Addendum  CTA demonstrate SFA and popliteal occlusion some collateral flow reconstituting a PT that attenuates.  It is not clear if the SFA occluded due to a technical issue from prior procedure or re-embolization.  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  I think it is reasonable to try one more attempt at thrombectomy.  The patient has agreed to proceed.  - Will attempt a Right leg thrombectomy - The risk, benefits, and alternative for the operation were discussed with the patient.  The patient is aware the risks include but are not limited to: bleeding, infection, myocardial infarction, stroke, limb loss, nerve damage, need for additional procedures in the future, wound complications, and inability to complete the thrombectomy.  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   - I have emphasized that it is possible regardless of whatever procedure we complete, he will rethrombose as the fundamental problem, his cancer has not been addressed. -  The patient is aware of these risks and agreed to proceed.    Adele Barthel, MD [facility] [place] Office: [phone] Pager: [phone]  06/11/2014, 8:04 PM

## 2014-06-11 NOTE — Progress Notes (Signed)
PT Cancellation Note  Patient Details Name: Philip Andrews MRN: [id number] DOB: June 25, 1956   Cancelled Treatment:    Reason Eval/Treat Not Completed: PT screened, no needs identified, will sign off; messaged by OT who reports patient continues to be at functional baseline following embolectomy and was signed off by PT yesterday at [facility].  Please re-consult PT if further issues arise.  Thanks   Trayce Maino,CYNDI 06/11/2014, 11:02 AM

## 2014-06-11 NOTE — Anesthesia Preprocedure Evaluation (Addendum)
Anesthesia Evaluation  Patient identified by MRN, date of birth, ID band Patient awake    Reviewed: Allergy & Precautions, H&P , NPO status , Patient's Chart, lab work & pertinent test results  Airway Mallampati: I TM Distance: >3 FB Neck ROM: Full    Dental  (+) Teeth Intact, Dental Advisory Given   Pulmonary Current Smoker,  breath sounds clear to auscultation        Cardiovascular Rhythm:Regular Rate:Normal     Neuro/Psych    GI/Hepatic PUD,   Endo/Other    Renal/GU      Musculoskeletal   Abdominal   Peds  Hematology   Anesthesia Other Findings Only 4 lower teeth remaining  Reproductive/Obstetrics                          Anesthesia Physical Anesthesia Plan  ASA: IV and emergent  Anesthesia Plan: Spinal and MAC   Post-op Pain Management:    Induction: Intravenous  Airway Management Planned: Simple Face Mask  Additional Equipment:   Intra-op Plan:   Post-operative Plan:   Informed Consent: I have reviewed the patients History and Physical, chart, labs and discussed the procedure including the risks, benefits and alternatives for the proposed anesthesia with the patient or authorized representative who has indicated his/her understanding and acceptance.   Dental advisory given  Plan Discussed with: CRNA, Anesthesiologist and Surgeon  Anesthesia Plan Comments:         Anesthesia Quick Evaluation

## 2014-06-11 NOTE — Progress Notes (Addendum)
   Daily Progress Note  Assessment/Planning: POD #1 s/p R femoral embolectomy, VPA R CFA; stage IV Lung cancer   Ok to transfer back to [facility] for his palliative cancer mgmt  Pt can follow up with Dr. Scot Dock in two weeks.  Defer anticoagulation to primary team.  Ok to start anticoagulation tomorrow.  Subjective  - 1 Day Post-Op  No complaints  Objective Filed Vitals:   06/10/14 2213 06/10/14 2342 06/11/14 0403 06/11/14 0700  BP: 108/64 99/67 108/57   Pulse: 104 109 106   Temp:  98.6 F (37 C) 98.2 F (36.8 C) 98.5 F (36.9 C)  TempSrc:  Oral Oral Oral  Resp: 19 17 22    Height:      Weight:      SpO2: 91% 95% 92%     Intake/Output Summary (Last 24 hours) at 06/11/14 0846 Last data filed at 06/11/14 0000  Gross per 24 hour  Intake   1800 ml  Output    450 ml  Net   1350 ml    PULM  CTAB CV  RRR GI  soft, NTND VASC  R groin inc c/d/i, dopplerable PT  Laboratory CBC    Component Value Date/Time   WBC 5.8 06/11/2014 0319   WBC 3.6* 05/12/2014 1341   HGB 12.8* 06/11/2014 0319   HGB 14.4 05/12/2014 1341   HCT 36.8* 06/11/2014 0319   HCT 41.6 05/12/2014 1341   PLT 324 06/11/2014 0319   PLT 241 05/12/2014 1341    BMET    Component Value Date/Time   NA 131* 06/11/2014 0319   NA 138 05/12/2014 1342   K 4.0 06/11/2014 0319   K 3.7 05/12/2014 1342   CL 93* 06/11/2014 0319   CO2 24 06/11/2014 0319   CO2 22 05/12/2014 1342   GLUCOSE 101* 06/11/2014 0319   GLUCOSE 94 05/12/2014 1342   BUN 9 06/11/2014 0319   BUN 10.8 05/12/2014 1342   CREATININE 0.58 06/11/2014 0319   CREATININE 0.8 05/12/2014 1342   CALCIUM 8.8 06/11/2014 0319   CALCIUM 9.2 05/12/2014 1342   GFRNONAA >90 06/11/2014 0319   GFRAA >90 06/11/2014 0319    Adele Barthel, MD [facility] [place] Office: [phone] Pager: [phone]  06/11/2014, 8:46 AM

## 2014-06-11 NOTE — Progress Notes (Addendum)
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 He had visits on 6/5 and 6/11 to discuss chemo planning and no showed both visits. Staging CT on 6/3 that showed mild progression of disease, of which he was to discuss treatment for at the visits he missed  He has had severe L sided CP w/ slight nausea for weeks he said. Relieved w/ dilaudid so not currently present. No radiation. No emesis. No diaphoresis. Some Dypnea but has lung carcinoma and the pain is worse w/ deep breathing . CTA in ED negative for PE or PNA.  His vitals are stable and labs are relatively unremarkable. He will be admitted for ACS r/o, hydration b/c orthostatic in ED, and discussion of further planning for carcinoma w/ oncology    HPI/Subjective: 7/3 A./O. x4, no complaints. Requested no when he will be transferred to [facility]  Assessment/Plan: Metastatic adenocarcinoma (probable lung primary) -Patient cleared to return to [place] [facility] for continuation of therapy per oncology  Pulmonary mass -Treatment per oncology  Hyponatremia -Mild continue to monitor, currently asymptomatic  Protein calorie malnutrition severe -Continue to encourage patient to eat. -Ensure Complete po TID  DVT -Duplex scan showed clot in the deep femoral artery on the right and also in the superficial femoral artery. [facility]. -S./P.7/2. Right femoral embolectomy,  Substance abuse,     Code Status: FULL Family Communication: no family present at time of exam Disposition Plan: Transfer to [facility]    Consultants: Dr. Joylene Igo (vascular  surgery)   Procedure/Significant Events: 6/16 CT angiogram chest PE protocol - No pulmonary embolus or acute aortic abnormality.  - Mild progression of hilar/ mediastinal adenopathy along with LUL pulmonary mass measuring 33 mm x 20 mm. 6/24 PCXR;Increasing bilat. perihilar mass/infiltration with suggestion focal developing infiltration Lt lung base. 6/25 CT chest without contrast; No definitive change left suprahilar mass/bilateral hilar and mediastinal lymphadenopathy. Inc infiltration/ atelectasis lung bases. Diffuse emphysematous changes. 7/2. Right femoral embolectomy,  Intraoperative arteriogram, Vein patch angioplasty of the right common femoral artery; material retrieved appeared to be more like potential carcinoma vs acute clot.    Culture 6/17 urine negative 6/25 blood right arm/hand negative  Antibiotics: Cefuroxime 7/2>> Levofloxacin 7/2>> stopped 7/2  DVT prophylaxis: Lovenox   Devices NA   LINES / TUBES:  7/2 20ga left forearm    Continuous Infusions: . sodium chloride Stopped (06/11/14 1013)  . DOPamine      Objective: VITAL SIGNS: Temp: 98.9 F (37.2 C) (07/03 1233) Temp src: Oral (07/03 1233) BP: 108/57 mmHg (07/03 0403) Pulse Rate: 106 (07/03 0403) SPO2; 92% on room FIO2:   Intake/Output Summary (Last 24 hours) at 06/11/14 1253 Last data filed at 06/11/14 0000  Gross per 24 hour  Intake   1680 ml  Output    450 ml  Net   1230 ml     Exam: General: A./O. x4, NAD, No acute respiratory distress, cachectic Lungs: Diffuse coarse breath sounds, negative wheezes or crackles Cardiovascular: Regular rate and rhythm without murmur gallop or rub normal S1 and S2 Abdomen: Nontender, nondistended, soft, bowel sounds positive, no rebound, no ascites, no appreciable mass Extremities: No significant cyanosis, clubbing, or edema bilateral  lower extremities  Data Reviewed: Basic Metabolic Panel:  Recent Labs Lab 06/08/14 0420 06/10/14 0438  06/10/14 1925 06/11/14 0319  NA 128* 132*  --  131*  K 4.2 4.0  --  4.0  CL 91* 93*  --  93*  CO2 25 30  --  24  GLUCOSE 125* 110*  --  101*  BUN 13 9  --  9  CREATININE 0.59 0.64 0.54 0.58  CALCIUM 9.2 9.1  --  8.8   Liver Function Tests:  Recent Labs Lab 06/11/14 0319  AST 14  ALT 7  ALKPHOS 75  BILITOT 0.3  PROT 6.8  ALBUMIN 2.6*   No results found for this basename: LIPASE, AMYLASE,  in the last 168 hours No results found for this basename: AMMONIA,  in the last 168 hours CBC:  Recent Labs Lab 06/08/14 0420 06/10/14 0438 06/10/14 1925 06/11/14 0319  WBC 6.9 4.9 5.9 5.8  HGB 13.6 13.3 13.2 12.8*  HCT 39.1 38.2* 38.1* 36.8*  MCV 92.0 91.0 91.6 91.5  PLT 366 341 316 324   Cardiac Enzymes: No results found for this basename: CKTOTAL, CKMB, CKMBINDEX, TROPONINI,  in the last 168 hours BNP (last 3 results)  Recent Labs  05/25/14 2127 06/02/14 2211  PROBNP 109.9 144.3*   CBG: No results found for this basename: GLUCAP,  in the last 168 hours  Recent Results (from the past 240 hour(s))  CULTURE, BLOOD (ROUTINE X 2)     Status: None   Collection Time    06/03/14 12:28 AM      Result Value Ref Range Status   Specimen Description BLOOD RIGHT ARM   Final   Special Requests BOTTLES DRAWN AEROBIC AND ANAEROBIC 10CC EACH   Final   Culture  Setup Time     Final   Value: 06/03/2014 09:24     Performed at [facility]   Culture     Final   Value: NO GROWTH 5 DAYS     Performed at [facility]   Report Status 06/09/2014 FINAL   Final  CULTURE, BLOOD (ROUTINE X 2)     Status: None   Collection Time    06/03/14 12:38 AM      Result Value Ref Range Status   Specimen Description BLOOD RIGHT HAND   Final   Special Requests BOTTLES DRAWN AEROBIC AND ANAEROBIC [facility]   Final   Culture  Setup Time     Final   Value: 06/03/2014 09:25     Performed at [facility]   Culture     Final   Value: NO GROWTH 5 DAYS     Performed at [facility]   Report Status 06/09/2014 FINAL   Final     Studies:  Recent x-ray studies have been reviewed in detail by the Attending Physician  Scheduled Meds:  Scheduled Meds: . docusate sodium  100 mg Oral Daily  . enoxaparin (LOVENOX) injection  40 mg Subcutaneous Q24H  . feeding supplement (ENSURE COMPLETE)  237 mL Oral TID WC  . levofloxacin  500 mg Oral Daily  . pantoprazole  40 mg Oral Daily  . sodium chloride  3 mL Intravenous Q12H    Time spent on care of this patient: 40 mins   Allie Bossier , MD   [facility] Office  [phone] Pager [phone]  On-Call/Text Page:      [email or web address]      password [email or web address]  If 7PM-7AM, please  contact night-coverage [email or web address] Password [email or web address] 06/11/2014, 12:53 PM   LOS: 9 days

## 2014-06-11 NOTE — Progress Notes (Signed)
Pt. moaning & crying out with pain in rt. Foot. Dr. Sherral Hammers was notified of pt's severe pain, & he gave premission to give dilaudid early.

## 2014-06-11 NOTE — Anesthesia Procedure Notes (Signed)
Procedure Name: Intubation Date/Time: 06/11/2014 9:54 PM Performed by: Claris Che Pre-anesthesia Checklist: Patient identified, Emergency Drugs available, Suction available and Patient being monitored Patient Re-evaluated:Patient Re-evaluated prior to inductionOxygen Delivery Method: Circle system utilized Preoxygenation: Pre-oxygenation with 100% oxygen Intubation Type: Rapid sequence and Cricoid Pressure applied Ventilation: Mask ventilation without difficulty Laryngoscope Size: Mac and 3 Grade View: Grade I Tube type: Oral Tube size: 7.5 mm Number of attempts: 1 Airway Equipment and Method: Stylet Placement Confirmation: ETT inserted through vocal cords under direct vision,  positive ETCO2 and breath sounds checked- equal and bilateral Secured at: 24 cm Tube secured with: Tape Dental Injury: Teeth and Oropharynx as per pre-operative assessment

## 2014-06-11 NOTE — Progress Notes (Signed)
Pt TX back to [facility] this pm. Just prior, Pt c/o of severe pain. Pain med had been admin, no relief. Pulses were checked and were thready at best to no pulse. Dr Bridgett Larsson notified, no new orders. Dr. Sherral Hammers made aware. Pt TX via carelink.

## 2014-06-11 NOTE — Evaluation (Signed)
Occupational Therapy Evaluation Patient Details Name: Philip Andrews MRN: [id number] DOB: 1956/02/11 Today's Date: 06/11/2014    History of Present Illness 58 yo male s/p Rt femoral thrombectomy and arteriogram. hx of metastatic adenocarcinoma of lung and substance abuse. Pt being treated at [facility] for CA follow up and SW addressing possible housing for d/c planning    Clinical Impression   Patient evaluated by Occupational Therapy with no further acute OT needs identified. All education has been completed and the patient has no further questions. See below for any follow-up Occupational Therapy or equipment needs. OT to sign off. Thank you for referral.      Follow Up Recommendations  No OT follow up    Equipment Recommendations  None recommended by OT    Recommendations for Other Services       Precautions / Restrictions Precautions Precautions: None      Mobility Bed Mobility                  Transfers Overall transfer level: Independent                    Balance                                 Standardized Balance Assessment Standardized Balance Assessment : Dynamic Gait Index   Dynamic Gait Index Level Surface: Normal Change in Gait Speed: Normal Gait with Vertical Head Turns: Normal      ADL Overall ADL's : Modified independent                                       General ADL Comments: Pt is able to ambulate carrying ICU monitor. pt sitting in recliner with bil LE curled up with knee flexion . Pt don sock RT LE mod I seated. Pt completed sink level grooming MOD I Pt complete brief balance assessment without deficits     Vision                     Perception     Praxis      Pertinent Vitals/Pain VSS     Hand Dominance Right   Extremity/Trunk Assessment Upper Extremity Assessment Upper Extremity Assessment: Overall WFL for tasks assessed   Lower Extremity Assessment Lower Extremity Assessment:  Overall WFL for tasks assessed   Cervical / Trunk Assessment Cervical / Trunk Assessment: Kyphotic   Communication Communication Communication: No difficulties   Cognition Arousal/Alertness: Awake/alert Behavior During Therapy: WFL for tasks assessed/performed Overall Cognitive Status: Within Functional Limits for tasks assessed (pt slightly impulsive but question this is baseline)                     General Comments       Exercises       Shoulder Instructions      Home Living                                   Additional Comments: Pt currently  homeless and requires (A) from SW for housing . PT has son as POA and spoke with son on phone. Son very concerned if released soon pt will be homeless. Son reports "he has no where to go. I am  trying to find somewhere right now for me"      Prior Functioning/Environment Level of Independence: Independent        Comments: doesnt drive    OT Diagnosis:     OT Problem List:     OT Treatment/Interventions:      OT Goals(Current goals can be found in the care plan section)    OT Frequency:     Barriers to D/C:            Co-evaluation              End of Session Nurse Communication: Mobility status  Activity Tolerance: Patient tolerated treatment well Patient left: in chair;with call bell/phone within reach   Time: 1020-1040 OT Time Calculation (min): 20 min Charges:  OT General Charges $OT Visit: 1 Procedure OT Evaluation $Initial OT Evaluation Tier I: 1 Procedure OT Treatments $Self Care/Home Management : 8-22 mins G-Codes:    Peri Maris June 23, 2014, 10:45 AM Pager: [phone]

## 2014-06-11 NOTE — Progress Notes (Signed)
NUTRITION FOLLOW UP  DOCUMENTATION CODES  Per approved criteria   -Severe malnutrition in the context of chronic illness  -Underweight    Pt meets criteria for severe MALNUTRITION in the context of chronic illness as evidenced by PO intake <75% for one month, severe muscle wasting and subcutaneous fat loss, 7.4% body weight loss in one month.  Intervention:   - Continue Ensure Complete po TID, each supplement provides 350 kcal and 13 grams of protein  Nutrition Dx:   Inadequate oral intake related to nausea/abd pain/vomiting as evidenced by PO intake <75%, 7.4% body weight loss in one month; ongoing  Goal:   Pt to meet >/= 90% of their estimated nutrition needs; not met  Monitor:   Total protein/energy intake, labs, weight, diet education needs, GI profile  Assessment:   Philip Andrews is a 58 y.o. 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 Has been unable to tolerate solid foods, consuming 3 Ensure Complete daily.  -Encouraged intake of soft high kcal/protein foods- peanut butter, whey powder to mix in w/milk and/or supplements, ice cream, puddings. Pt in agreement to consume milkshake as 8PM nourishment for nutrient replenishment  -Current PO intake is 50%, receiving assistance for meal ordering  -Discussed intake of bland foods and gingerale to assist with nausea  -Provided supplement coupons  -Endorsed an unintentional wt loss of 42 lbs since 10/2013. -Experienced 10 lbs weight loss in last month, or 7.4% body weight loss in one month, which is severe for time frame  -Pt with evident signs of severe muscle wasting and subcutaneous fat loss in temporal, clavicle and acromion regions  6/28: - Pt continues to drink his ensure TID. - Pt reports that he still is experiencing nausea and pain which is still effecting his appetite. - Pt was  provided with nutrition therapy and re-educated on a high-calorie, high-protein diet as well as ways to reduce nausea. - Pt was encouraged to consume small, frequent meals and snacks.  Pt transferred to [facility] 7/2 for emergent right femoral embolectomy due to ischemic right LE. Per MD notes likely was not a clot but material likely carcinoma. Per MD pt is ready to transfer back to [facility] for continued palliative XRT. Per pt he is ready to go back to [facility]. Pt states he does not eat much solid food but loves ensure.  Sodium low.   Height: Ht Readings from Last 1 Encounters:  06/03/14 $RemoveB'5\' 9"'qBUDJCLK$  (1.753 m)    Weight Status:   Wt Readings from Last 1 Encounters:  06/07/14 125 lb 3.5 oz (56.8 kg)  Admission weight: 130 lb (58.9 kg) 6/24  Re-estimated needs:  Kcal: 1900-2100 Protein: 85-100 g Fluid: 1.9-2.1 L/day  Skin: WNL  Diet Order: Clear Liquid   Intake/Output Summary (Last 24 hours) at 06/11/14 0943 Last data filed at 06/11/14 0000  Gross per 24 hour  Intake   1680 ml  Output    450 ml  Net   1230 ml    Last BM: 7/1   Labs:   Recent Labs Lab 06/08/14 0420 06/10/14 0438 06/10/14 1925 06/11/14 0319  NA 128* 132*  --  131*  K 4.2 4.0  --  4.0  CL 91* 93*  --  93*  CO2 25 30  --  24  BUN 13 9  --  9  CREATININE 0.59  0.64 0.54 0.58  CALCIUM 9.2 9.1  --  8.8  GLUCOSE 125* 110*  --  101*    CBG (last 3)  No results found for this basename: GLUCAP,  in the last 72 hours  Scheduled Meds: . docusate sodium  100 mg Oral Daily  . enoxaparin (LOVENOX) injection  40 mg Subcutaneous Q24H  . feeding supplement (ENSURE COMPLETE)  237 mL Oral TID WC  . levofloxacin  500 mg Oral Daily  . pantoprazole  40 mg Oral Daily  . sodium chloride  3 mL Intravenous Q12H    Continuous Infusions: . sodium chloride 30 mL/hr at 06/10/14 1800  . DOPamine      [place], [place], [place] Pager [phone] After Hours Pager

## 2014-06-12 DIAGNOSIS — I743 Embolism and thrombosis of arteries of the lower extremities: Secondary | ICD-10-CM

## 2014-06-12 DIAGNOSIS — D6859 Other primary thrombophilia: Secondary | ICD-10-CM

## 2014-06-12 LAB — COMPREHENSIVE METABOLIC PANEL
ALBUMIN: 2.7 g/dL — AB (ref 3.5–5.2)
ALK PHOS: 69 U/L (ref 39–117)
ALT: 8 U/L (ref 0–53)
ANION GAP: 13 (ref 5–15)
AST: 19 U/L (ref 0–37)
BUN: 8 mg/dL (ref 6–23)
CO2: 26 mEq/L (ref 19–32)
CREATININE: 0.58 mg/dL (ref 0.50–1.35)
Calcium: 9.1 mg/dL (ref 8.4–10.5)
Chloride: 95 mEq/L — ABNORMAL LOW (ref 96–112)
GFR calc Af Amer: >90 mL/min (ref >90)
GFR calc non Af Amer: >90 mL/min (ref >90)
Glucose, Bld: 138 mg/dL — ABNORMAL HIGH (ref 70–99)
POTASSIUM: 5.6 meq/L — AB (ref 3.7–5.3)
Sodium: 134 mEq/L — ABNORMAL LOW (ref 137–147)
Total Bilirubin: 0.2 mg/dL — ABNORMAL LOW (ref 0.3–1.2)
Total Protein: 6.6 g/dL (ref 6.0–8.3)

## 2014-06-12 LAB — PROTIME-INR
INR: 1.2 (ref 0.00–1.49)
Prothrombin Time: 15.2 seconds (ref 11.6–15.2)

## 2014-06-12 LAB — URINALYSIS, ROUTINE W REFLEX MICROSCOPIC
Bilirubin Urine: NEGATIVE
Glucose, UA: NEGATIVE mg/dL
Ketones, ur: 15 mg/dL — AB
Leukocytes, UA: NEGATIVE
Nitrite: NEGATIVE
PROTEIN: NEGATIVE mg/dL
Specific Gravity, Urine: 1.024 (ref 1.005–1.030)
UROBILINOGEN UA: 0.2 mg/dL (ref 0.0–1.0)
pH: 7.5 (ref 5.0–8.0)

## 2014-06-12 LAB — CBC
HCT: 34 % — ABNORMAL LOW (ref 39.0–52.0)
Hemoglobin: 11.6 g/dL — ABNORMAL LOW (ref 13.0–17.0)
MCH: 31.1 pg (ref 26.0–34.0)
MCHC: 34.1 g/dL (ref 30.0–36.0)
MCV: 91.2 fL (ref 78.0–100.0)
PLATELETS: 271 10*3/uL (ref 150–400)
RBC: 3.73 MIL/uL — ABNORMAL LOW (ref 4.22–5.81)
RDW: 11.4 % — AB (ref 11.5–15.5)
WBC: 7 10*3/uL (ref 4.0–10.5)

## 2014-06-12 LAB — URINE MICROSCOPIC-ADD ON

## 2014-06-12 LAB — POTASSIUM: Potassium: 4.1 mEq/L (ref 3.7–5.3)

## 2014-06-12 LAB — HEPARIN LEVEL (UNFRACTIONATED)
Heparin Unfractionated: <0.1 IU/mL — ABNORMAL LOW (ref 0.30–0.70)
Heparin Unfractionated: <0.1 IU/mL — ABNORMAL LOW (ref 0.30–0.70)

## 2014-06-12 MED ORDER — GUAIFENESIN-DM 100-10 MG/5ML PO SYRP: 15.0000 mL | ORAL_SOLUTION | ORAL | Status: DC | PRN

## 2014-06-12 MED ORDER — SODIUM CHLORIDE 0.9 % IV SOLN
INTRAVENOUS | Status: DC
  Administered 2014-06-12 – 2014-06-13 (×2): via INTRAVENOUS

## 2014-06-12 MED ORDER — LABETALOL HCL 5 MG/ML IV SOLN
10.0000 mg | INTRAVENOUS | Status: DC | PRN
  Filled 2014-06-12: qty 4

## 2014-06-12 MED ORDER — HYDRALAZINE HCL 20 MG/ML IJ SOLN
10.0000 mg | INTRAMUSCULAR | Status: DC | PRN
  Filled 2014-06-12: qty 0.5

## 2014-06-12 MED ORDER — OXYCODONE HCL 5 MG PO TABS
5.0000 mg | ORAL_TABLET | ORAL | Status: DC | PRN
  Administered 2014-06-12 – 2014-06-14 (×5): 10 mg via ORAL
  Filled 2014-06-12 (×5): qty 2

## 2014-06-12 MED ORDER — DOCUSATE SODIUM 100 MG PO CAPS
100.0000 mg | ORAL_CAPSULE | Freq: Two times a day (BID) | ORAL | Status: DC
  Administered 2014-06-12 – 2014-06-17 (×10): 100 mg via ORAL
  Filled 2014-06-12 (×12): qty 1

## 2014-06-12 MED ORDER — HYDROMORPHONE HCL PF 1 MG/ML IJ SOLN
INTRAMUSCULAR | Status: AC
  Administered 2014-06-12: 0.5 mg via INTRAVENOUS
  Filled 2014-06-12: qty 1

## 2014-06-12 MED ORDER — BISACODYL 10 MG RE SUPP
10.0000 mg | Freq: Every day | RECTAL | Status: DC | PRN
Start: 2014-06-12 — End: 2014-06-17

## 2014-06-12 MED ORDER — POTASSIUM CHLORIDE CRYS ER 20 MEQ PO TBCR: 20.0000 meq | EXTENDED_RELEASE_TABLET | Freq: Once | ORAL | Status: DC

## 2014-06-12 MED ORDER — HEPARIN (PORCINE) IN NACL 100-0.45 UNIT/ML-% IJ SOLN
1600.0000 [IU]/h | INTRAMUSCULAR | Status: DC
  Administered 2014-06-12: 1500 [IU]/h via INTRAVENOUS
  Administered 2014-06-12: 1000 [IU]/h via INTRAVENOUS
  Administered 2014-06-13: 1500 [IU]/h via INTRAVENOUS
  Filled 2014-06-12 (×4): qty 250

## 2014-06-12 MED ORDER — PANTOPRAZOLE SODIUM 40 MG PO TBEC
40.0000 mg | DELAYED_RELEASE_TABLET | Freq: Every day | ORAL | Status: DC
  Administered 2014-06-12 – 2014-06-17 (×6): 40 mg via ORAL
  Filled 2014-06-12 (×6): qty 1

## 2014-06-12 MED ORDER — ONDANSETRON HCL 4 MG/2ML IJ SOLN: 4.0000 mg | Freq: Once | INTRAMUSCULAR | Status: DC | PRN

## 2014-06-12 MED ORDER — ZOLPIDEM TARTRATE 5 MG PO TABS: 5.0000 mg | ORAL_TABLET | Freq: Every evening | ORAL | Status: DC | PRN

## 2014-06-12 MED ORDER — OXYCODONE HCL 5 MG/5ML PO SOLN: 5.0000 mg | Freq: Once | ORAL | Status: DC | PRN

## 2014-06-12 MED ORDER — DEXTROSE 5 % IV SOLN
1.5000 g | Freq: Two times a day (BID) | INTRAVENOUS | Status: AC
  Administered 2014-06-12 (×2): 1.5 g via INTRAVENOUS
  Filled 2014-06-12 (×2): qty 1.5

## 2014-06-12 MED ORDER — LIDOCAINE HCL (CARDIAC) 20 MG/ML IV SOLN
INTRAVENOUS | Status: AC
  Filled 2014-06-12: qty 5

## 2014-06-12 MED ORDER — METOPROLOL TARTRATE 1 MG/ML IV SOLN
2.0000 mg | INTRAVENOUS | Status: DC | PRN
  Administered 2014-06-13: 2 mg via INTRAVENOUS
  Filled 2014-06-12: qty 5

## 2014-06-12 MED ORDER — HYDROMORPHONE HCL PF 1 MG/ML IJ SOLN
0.5000 mg | INTRAMUSCULAR | Status: DC | PRN
  Administered 2014-06-12 – 2014-06-13 (×9): 1 mg via INTRAVENOUS
  Filled 2014-06-12 (×10): qty 1

## 2014-06-12 MED ORDER — PHENOL 1.4 % MT LIQD: 1.0000 | OROMUCOSAL | Status: DC | PRN

## 2014-06-12 MED ORDER — ALUM & MAG HYDROXIDE-SIMETH 200-200-20 MG/5ML PO SUSP: 15.0000 mL | ORAL | Status: DC | PRN

## 2014-06-12 MED ORDER — ACETAMINOPHEN 325 MG PO TABS: 325.0000 mg | ORAL_TABLET | ORAL | Status: DC | PRN

## 2014-06-12 MED ORDER — ACETAMINOPHEN 650 MG RE SUPP: 325.0000 mg | RECTAL | Status: DC | PRN

## 2014-06-12 MED ORDER — HYDROMORPHONE HCL PF 1 MG/ML IJ SOLN
0.2500 mg | INTRAMUSCULAR | Status: DC | PRN
  Administered 2014-06-12 (×4): 0.5 mg via INTRAVENOUS

## 2014-06-12 MED ORDER — ONDANSETRON HCL 4 MG/2ML IJ SOLN: 4.0000 mg | Freq: Four times a day (QID) | INTRAMUSCULAR | Status: DC | PRN

## 2014-06-12 MED ORDER — OXYCODONE HCL 5 MG PO TABS: 5.0000 mg | ORAL_TABLET | Freq: Once | ORAL | Status: DC | PRN

## 2014-06-12 NOTE — Progress Notes (Signed)
ANTICOAGULATION CONSULT NOTE - Follow Up Consult  Pharmacy Consult for heparin Indication: DVT  Labs:  Recent Labs  06/10/14 1925 06/11/14 0319 06/12/14 0250 06/12/14 1125 06/12/14 2120  HGB 13.2 12.8* 11.6*  --   --   HCT 38.1* 36.8* 34.0*  --   --   PLT 316 324 271  --   --   LABPROT  --   --  15.2  --   --   INR  --   --  1.20  --   --   HEPARINUNFRC  --   --   --  <0.10* <0.10*  CREATININE 0.54 0.58 0.58  --   --     Assessment: 58yo male remains undetectable on heparin after rate increase.  Goal of Therapy:  Heparin level 0.3-0.7 units/ml   Plan:  Will increase heparin gtt by 4 units/kg/hr to 1500 units/hr and check level with am labs; f/u w/ daytime team regarding transitioning back to [facility].  Wynona Neat, PharmD, BCPS  06/12/2014,10:45 PM

## 2014-06-12 NOTE — Progress Notes (Signed)
ANTICOAGULATION CONSULT NOTE - Initial Consult  Pharmacy Consult for heparin Indication: DVT  Allergies  Allergen Reactions  . Benadryl [Diphenhydramine Hcl]     Pt states he just knows he's allergic    Patient Measurements: Height: 5\' 9"  (175.3 cm) Weight: 137 lb 9.1 oz (62.4 kg) IBW/kg (Calculated) : 70.7  Vital Signs: Temp: 97.6 F (36.4 C) (07/04 0200) Temp src: Oral (07/04 0200) BP: 124/73 mmHg (07/04 0200) Pulse Rate: 113 (07/04 0200)  Labs:  Recent Labs  06/10/14 0438 06/10/14 1925 06/11/14 0319  HGB 13.3 13.2 12.8*  HCT 38.2* 38.1* 36.8*  PLT 341 316 324  CREATININE 0.64 0.54 0.58    Estimated Creatinine Clearance: 87.8 ml/min (by C-G formula based on Cr of 0.58).   Medical History: Past Medical History  Diagnosis Date  . Substance abuse     per H&P  . Lung nodules   . Lung nodules   . Gastric ulcer   . History of radiation therapy 11/26/13-12/15/13    30 gray to right lower posterior chest wall mass  . 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 dexamethasone (DECADRON) 4 MG tablet Take 2.5 tabs (10 mg) at bedtime the night before first chemotherapy and at 6am the day of first chemotherapy.  5 tablet  0  . ibuprofen (ADVIL,MOTRIN) 600 MG tablet Take 1 tablet (600 mg total) by mouth every 8 (eight) hours as needed.  30 tablet  1  . Ipratropium-Albuterol (COMBIVENT) 20-100 MCG/ACT AERS respimat Inhale 1 puff into the lungs every 6 (six) hours as needed for wheezing or shortness of breath.  1 Inhaler  2  . ondansetron (ZOFRAN) 8 MG tablet Take 1 tablet (8 mg total) by mouth every 8 (eight) hours as needed for nausea or vomiting.  20 tablet  1  . oxyCODONE-acetaminophen (PERCOCET/ROXICET) 5-325 MG per tablet Take 2 tablets by mouth every 6 (six) hours as needed for severe pain.  40 tablet  0  . prochlorperazine (COMPAZINE) 10 MG tablet Take 1 tablet (10 mg total) by mouth every  6 (six) hours as needed for nausea or vomiting.  30 tablet  1   Scheduled:  . cefUROXime (ZINACEF)  IV  1.5 g Intravenous Q12H  . docusate sodium  100 mg Oral BID  . pantoprazole  40 mg Oral Daily  . potassium chloride  20-40 mEq Oral Once   Infusions:  . sodium chloride 30 mL/hr at 06/12/14 0216    Assessment: 58yo male admitted 6/25 to [facility] for CP found to be associated w/ metastatic adenocarcinoma of lung, tx'd to [facility] for urgent embolectomy after duplex revealed clot in deep femoral artery, had been given one dose of therapeutic LMWH (had been on prophylactic LMWH prior to embolization), now to change to heparin post-op.  Goal of Therapy:  Heparin level 0.3-0.7 units/ml Monitor platelets by anticoagulation protocol: Yes   Plan:  Rec'd Lovenox 60mg  7/3 at 1845; will begin heparin gtt at 1000 units/hr beginning 0400 this am and monitor heparin levels and CBC.  Wynona Neat, PharmD, BCPS  06/12/2014,2:20 AM

## 2014-06-12 NOTE — Progress Notes (Signed)
IP PROGRESS NOTE  Subjective:   Philip Andrews is recovering from surgery yesterday. He continues to have pain at the left chest and rt. Leg/foot. He does not have a place to live.  Objective: Vital signs in last 24 hours: Blood pressure 121/76, pulse 108, temperature 98.8 F (37.1 C), temperature source Oral, resp. rate 28, height 5\' 9"  (1.753 m), weight 134 lb 4.2 oz (60.9 kg), SpO2 93.00%.  Intake/Output from previous day: 07/03 0701 - 07/04 0700 In: 2240 [I.V.:1940; IV Piggyback:300] Out: 1210 [Urine:1210]  Physical Exam: Cardiac: Regular rate and rhythm Lungs: Bilateral expiratory wheeze Abdomen: Soft and nontender Vascular: The right foot is cold, diminished right DP pulse  Musculoskeletal: Tender at the left anterior chest  Lab Results:  Recent Labs  06/11/14 0319 06/12/14 0250  WBC 5.8 7.0  HGB 12.8* 11.6*  HCT 36.8* 34.0*  PLT 324 271    BMET  Recent Labs  06/11/14 0319 06/12/14 0250  NA 131* 134*  K 4.0 5.6*  CL 93* 95*  CO2 24 26  GLUCOSE 101* 138*  BUN 9 8  CREATININE 0.58 0.58  CALCIUM 8.8 9.1    Medications: I have reviewed the patient's current medications.  Assessment/Plan:  1. Metastatic adenocarcinoma involving a pericardial effusion, TTF-1 and Napsin-A negative.  Status post pericardial window procedure and bronchoscopy on 10/31/2013. No tumor found at bronchoscopy and the pericardial tissue was negative for malignancy.  Staging PET scan 11/17/2013 with a hypermetabolic 1.6 cm left upper lobe suprahilar nodule with SUV max 9.1; medial to that nodule is a 6 mm nodule not showing any increased metabolic activity; multiple hypermetabolic small mediastinal and hilar lymph nodes bilaterally; additional nodes present in the AP window, prevascular space and both hila; focally increased metabolic activity within the soft tissues of the right back with SUV max 6.7.  Status post palliative radiation to a soft tissue metastasis at the right back  11/26/2013 through 12/15/2013.  Restaging CT chest and abdomen 02/18/2014. Interval enlargement of the left upper lobe pulmonary nodule; enlargement of pericardial lesions overlying the heart; enlargement of bilateral hilar and mediastinal lymph nodes; ill-defined lesion in the liver not identified on prior studies. Previously suspected soft tissue lesion in the paraspinous muscular posterior to the right kidney not confidently identified on current study.  Cycle 1 Taxol/carboplatin 03/04/2014.  Cycle 2 Taxol/carboplatin 03/25/2014. Restaging CT 05/12/2014 with mild progression of the chest lymphadenopathy 2. Respiratory failure/cardiac tamponade secondary to #1. Resolved. 3. History of polysubstance abuse. 4. History of Acute renal failure. Resolved. 5. Mild normocytic anemia. 6. Left hilar mass on the noncontrast chest CT 10/24/2013. 7. Right low back/flank pain. Likely secondary to a soft tissue metastasis at the right back. He completed palliative radiation 11/26/2013 through 12/15/2013. Resolved.  8. Repeat hospital admission with left anterior chest pain-this appears to be related to a mass at the left anterior chest wall/pericardial  Initiation of palliative radiation 06/07/2014 9. Parenchymal infiltrates in the lower lungs-potentially nodular tumor spread 10. Acute Rt. Foot pain 06/10/16, s/p rt. Femoral embolectomy 06/10/14  Recurrent SFA and popliteal occlusion 06/11/14,s/p thromboembolectomy procedures to Rt. Leg vasculature 06/11/14, now on heparin   Philip Andrews has developed thromboembolic disease at the right lower extremity. This is likely related to a hypercoagulation syndrome. 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 The prognosis is poor.   Recommendations: 1. continue palliative radiation to the chest 2. Narcotic analgesics for pain 3. [facility] hospice referral 4. continue heparin anticoagulation, and aspirin if felt indicated per vascular surgery  5. social work consult to arrange  for outpatient Hospice care, he now may qualify for [facility] given the poor prognosis associated with the thromboembolic disease  Please call oncology as needed. I will check on him 06/14/2014.       LOS: 10 days   Philip Andrews  06/12/2014, 8:21 AM

## 2014-06-12 NOTE — Anesthesia Postprocedure Evaluation (Signed)
  Anesthesia Post-op Note  Patient: Philip Andrews  Procedure(s) Performed: Procedure(s): Right Femoral, Popliteal, Tibial Artery Embolectomy; Four Compartment Fasciotomy (Right)  Patient Location: PACU  Anesthesia Type:General  Level of Consciousness: awake and alert   Airway and Oxygen Therapy: Patient Spontanous Breathing and Patient connected to nasal cannula oxygen  Post-op Pain: mild  Post-op Assessment: Post-op Vital signs reviewed  Post-op Vital Signs: Reviewed  Last Vitals:  Filed Vitals:   06/12/14 0045  BP: 132/75  Pulse: 108  Temp: 36.4 C  Resp: 16    Complications: No apparent anesthesia complications

## 2014-06-12 NOTE — Progress Notes (Signed)
ANTICOAGULATION CONSULT NOTE - Follow Up Consult  Pharmacy Consult for Heparin Indication: DVT  Allergies  Allergen Reactions  . Benadryl [Diphenhydramine Hcl]     Pt states he just knows he's allergic    Patient Measurements: Height: 5\' 9"  (175.3 cm) Weight: 134 lb 4.2 oz (60.9 kg) IBW/kg (Calculated) : 70.7 Heparin Dosing Weight: 60.9  Vital Signs: Temp: 98.5 F (36.9 C) (07/04 1133) Temp src: Oral (07/04 1133) BP: 108/64 mmHg (07/04 1133) Pulse Rate: 102 (07/04 1133)  Labs:  Recent Labs  06/10/14 1925 06/11/14 0319 06/12/14 0250 06/12/14 1125  HGB 13.2 12.8* 11.6*  --   HCT 38.1* 36.8* 34.0*  --   PLT 316 324 271  --   LABPROT  --   --  15.2  --   INR  --   --  1.20  --   HEPARINUNFRC  --   --   --  <0.10*  CREATININE 0.54 0.58 0.58  --     Estimated Creatinine Clearance: 85.6 ml/min (by C-G formula based on Cr of 0.58).   Medications:  Heparin @ 1000 units/hr  Assessment: 89 YOM admitted 6/25 to [facility] for CP found to be associated w/ metastatic adenocarcinoma of lung, tx'd to [facility] for urgent embolectomy after duplex revealed clot in deep femoral artery, had been given one dose of therapeutic LMWH on 7/3 @ 1845 (had been on prophylactic LMWH prior to embolization) and transitioned to heparin IV on 7/4 AM.   Heparin level this afternoon is SUBtherapeutic (HL <0.1, goal of 0.3-0.7). Hgb/Hct/Plt slight drop - no overt s/sx of bleeding noted. Noted possible transition to lovenox on 7/5 if no bleeding complications noted with heparin.   Goal of Therapy:  Heparin level 0.3-0.7 units/ml Monitor platelets by anticoagulation protocol: Yes   Plan:  1. Increase heparin to 1200 units/hr (12 ml/hr) 2. Will continue to monitor for any signs/symptoms of bleeding and will follow up with heparin level in 6 hours   Alycia Rossetti, PharmD, BCPS Clinical Pharmacist Pager: [phone] 06/12/2014 2:32 PM

## 2014-06-12 NOTE — Progress Notes (Addendum)
   Daily Progress Note  Assessment/Planning: POD #1 s/p R leg TE, 4 compartment fasciotomy for embolism to R femoral arteries, Stage IV lung cancer   Intact signals in R foot today.  Recovery of motor in R foot.  If no bleeding complications on Heparin drip today, will start Lovenox tomorrow  Awaiting path results on embolic material suspicious for tumor   No evidence of compartment syndrome: fasciotomies already completed  Repeat K to see if truly elevated  Once pt is started on Lovenox, will transfer pt back to his hospitalist service to resume his palliative oncology services  Keep in stepdown ICU for observation of signals given tenuous status and observation for possible bleeding after anticoagulation resumes  Subjective  - 1 Day Post-Op  Pain beter  Objective Filed Vitals:   06/12/14 0200 06/12/14 0404 06/12/14 0736 06/12/14 0753  BP: 124/73 121/76 105/64   Pulse: 113 106  108  Temp: 97.6 F (36.4 C) 98.1 F (36.7 C)  98.8 F (37.1 C)  TempSrc: Oral Oral  Oral  Resp: 10 23 19 28   Height: 5\' 9"  (1.753 m)     Weight: 134 lb 4.2 oz (60.9 kg)     SpO2: 92% 97% 97% 93%    Intake/Output Summary (Last 24 hours) at 06/12/14 0854 Last data filed at 06/12/14 0800  Gross per 24 hour  Intake   2320 ml  Output   1210 ml  Net   1110 ml    PULM  BLL rales CV  Tachycardiac, Nl S1,S2 GI  soft, NTND VASC  Faintly palpable AT with dopplerable AT and PT  Laboratory CBC    Component Value Date/Time   WBC 7.0 06/12/2014 0250   WBC 3.6* 05/12/2014 1341   HGB 11.6* 06/12/2014 0250   HGB 14.4 05/12/2014 1341   HCT 34.0* 06/12/2014 0250   HCT 41.6 05/12/2014 1341   PLT 271 06/12/2014 0250   PLT 241 05/12/2014 1341    BMET    Component Value Date/Time   NA 134* 06/12/2014 0250   NA 138 05/12/2014 1342   K 5.6* 06/12/2014 0250   K 3.7 05/12/2014 1342   CL 95* 06/12/2014 0250   CO2 26 06/12/2014 0250   CO2 22 05/12/2014 1342   GLUCOSE 138* 06/12/2014 0250   GLUCOSE 94 05/12/2014 1342   BUN 8  06/12/2014 0250   BUN 10.8 05/12/2014 1342   CREATININE 0.58 06/12/2014 0250   CREATININE 0.8 05/12/2014 1342   CALCIUM 9.1 06/12/2014 0250   CALCIUM 9.2 05/12/2014 1342   GFRNONAA >90 06/12/2014 0250   GFRAA >90 06/12/2014 [place], MD [facility] [place]: [phone] Pager: [phone]  06/12/2014, 8:54 AM   Addendum  Repeat K 4.1  Adele Barthel, MD [facility] [place]: [phone] Pager: [phone]  06/12/2014, 10:16 AM

## 2014-06-12 NOTE — Op Note (Signed)
OPERATIVE NOTE   PROCEDURE: 1. Right superficial and profunda femoral artery thromboembolectomy 2. Right popliteal thromboembolectomy 3. Right anterior tibial artery and posterior tibial artery thromboembolectomy 4. Four compartment fasciotomy  PRE-OPERATIVE DIAGNOSIS: likely new embolism to right leg  POST-OPERATIVE DIAGNOSIS: same as above   SURGEON: Adele Barthel, MD  ASSISTANT(S): Silva Bandy, PAC   ANESTHESIA: general  ESTIMATED BLOOD LOSS: 300 cc  FINDING(S): 1. Likely tumor material in common, profunda, and superficial femoral arteries 2. Acute thrombus in tibial arteries 3. Viable muscle in all compartments 4. Dopplerable anterior tibial artery > posterior tibial artery signal at end of case  SPECIMEN(S):  superficial femoral artery   INDICATIONS:   Philip Andrews is a 58 y.o. male w/ stage IV lung cancer who presents with re-occlusion of right leg vasculature.  This patient had a common femoral artery embolectomy yesterday.  This early afternoon he redeveloped symptoms which worsened as the day progressed.  He had a CTA completed with demonstrated re-occlusion of the right leg arterial system, suggestive of new embolism.   I recommended to the patient another attempt at thrombectomy of the right leg with likely four compartment fasciotomy. The risk, benefits, and alternative for the operation were discussed with the patient. The patient is aware the risks include but are not limited to: bleeding, infection, myocardial infarction, stroke, limb loss, nerve damage, need for additional procedures in the future, wound complications, and inability to complete the thrombectomy.    DESCRIPTION: After obtaining full informed written consent, the patient was brought back to the operating room and placed supine upon the operating table.  The patient received IV antibiotics prior to induction.  After obtaining adequate anesthesia, the patient was prepped and draped in the standard  fashion for: a right leg bypass.  I took off the prior Dermabond over the right groin incision.  I sharply transected the prior subcutaneous stitches to gain access to the common femoral artery, which was patent and easily palpable.  I placed vessel loops around the profunda femoral artery and superficial femoral artery.  The patient was given 6000 units of Heparin intravenously, which was a therapeutic bolus.  In total, 8000 units of Heparin was administrated to achieve and maintain a therapeutic level of anticoagulation.  After waiting three minutes, I clamped the proximal common femoral artery and profunda femoral artery.  I made a longitudinal incision in the vein patch previously placed on the common femoral artery.  Immediately, I could visualize tumor appearing debris in the common femoral artery.  There was no back bleeding from the superficial femoral artery.  I passed a 3 and 4 Fogarty down into the profunda femoral artery.  I obtained some tumor debris and some acute clot.  After multiple passes, I could get not further debris or clot from the profunda femoral artery.  There was reasonable backbleeding at this point.  I flushed heparinized saline into the profunda femoral artery and placed it under tension.  At this point, I passed the 4 and 5 Fogarty down the superficial femoral artery.  I was able to obtain tumor debris and acute thrombus from the superficial femoral artery.  Due to the limited length of the Fogarty, I could only get down to the mid-calf.  After multiple passes, no further thrombus could be obtained.  I injected heparinized saline and placed the superficial femoral artery under tension.  I allowed the common femoral artery to bleed in an antegrade fashion.  There was good pulsatile bleeding  without any thrombus evident.  I reclamped the common femoral artery.  The venotomy was repaired with a running stitch of 6-0 Prolene.  Thrombin and gelfoam was applied to the venotomy.    I turned  my attention to the calf.  I placed a bump and internal rotated the right lower leg.  I made an incision one fingerwidth behind the tibia.  I dissected through subcutaneous tissue and fascia with electrocautery.  I retracted the medial belly of the gastrocnemius posteriorly to gain access to the popliteal vessels.  I dissected out the medial popliteal vein and was able to place a vessel loop around the vessel to help gain access to the popliteal artery.  This was dissected down to the anterior tibial artery takeoff.  I took down the soleus muscle to gain exposure of the tibioperoneal trunk with the peroneal and posterior tibial artery take offs.  I made a transverse incision in the mid-below-the-knee popliteal artery.   I passed a 3 Fogarty proximally, obtaining a little bit of residual acute thrombus.  After another two passes, no further thrombus was noted.  I clamped the popliteal artery and released the vessel loop on the superficial femoral artery.  There was palpable pulse in the popliteal artery.  I passed the 3 Fogarty down the anterior tibial artery and was able to get the level of the ankle.  I was able to extract acute thrombus from the anterior tibial artery.  After multiple passes, no further thrombus was obtained.  There was excellent backbleeding from the anterior tibial artery.  Due to the angle and the position of the medial popliteal vein, I could not direct the catheter down the tibioperoneal trunk.  I repaired this arteriotomy with a running stitch of 7-0 Prolene.  I then turned my attention to the tibioperoneal trunk. I made a transverse arteriotomy in the tibioperoneal trunk just proximal to the peroneal and posterior tibial artery takeoffs.  I tried to cannulate the peroneal artery, but could not get the Fogarty to pass down the peroneal artery.  I was able to pass the catheter down the posterior tibial artery.  There was minimal thrombus in this artery.  After a few passes, no further  thrombus could be obtained.  There was excellent backbleeding from tibioperoneal trunk.  I repaired this arteriotomy with a running stitch of 7-0.  There was some difficulty repairing the tibioperoneal trunk due to weakness in the wall after the repeated thrombectomy manuevers.  Multiple repair stitches of 7-0 Prolene were needed to repair this artery.  Thrombin and gelfoam were applied to the arteriotomies.    I turned my attention back to the groin.  The gelfoam was removed and the groin washed out.  There was no active bleeding.  The groin was repaired with a double layer of 2-0 Vicryl, a double layer of 3-0 Vicryl and the skin reapproximated with staples in case bleeding develops as the patient is going to be started immediately on anticoagulation post-operatively.    I turned my attention back to the medial exposure of the calf.  There was no further active bleeding.  There were easily palpable popliteal and tibial artery pulses.  I extended the medial fasciotomy subcutaneously by passing the scissor through the fascia, down to the level of distal 1/3 of the calf.  There was active muscle motion in all superficial and deep posterior compartments.  I then made an incision halfway between the fibula and tibia on the lateral surface.  I  dissected out the anterior and lateral compartment and made a defect in the fascia of both compartments with electrocautery.  I extended these fasciotomies proximally with scissors.  I then extend these fasciotomy distally with scissors.  There was active bleeding distally, so I extended the incision.  I controlled the bleeding vessels with electrocautery.  There was no further active bleeding.  All muscles in the lateral and anterior compartments were mobile.    At this point, I reapproximate the subcutaneous tissue in the medial fasciotomy.  The skin was staples together, leaving the fascia open.  Laterally the skin was reapproximated with staples.  All incisions were  cleaned, dried, and sterile bandages applied.  At the end of the case, I marked the anterior tibial artery and posterior tibial arteries which had dopplerable signals.  I could faintly feel both of these arteries.  COMPLICATIONS: none  CONDITION: stable  Adele Barthel, MD [facility] [place] Office: [phone] Pager: [phone]  06/12/2014, 12:36 AM

## 2014-06-12 NOTE — Progress Notes (Signed)
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 He had visits on 6/5 and 6/11 to discuss chemo planning and no showed both visits. Staging CT on 6/3 that showed mild progression of disease, of which he was to discuss treatment for at the visits he missed  He has had severe L sided CP w/ slight nausea for weeks he said. Relieved w/ dilaudid so not currently present. No radiation. No emesis. No diaphoresis. Some Dypnea but has lung carcinoma and the pain is worse w/ deep breathing . CTA in ED negative for PE or PNA.  His vitals are stable and labs are relatively unremarkable. He will be admitted for ACS r/o, hydration b/c orthostatic in ED, and discussion of further planning for carcinoma w/ oncology    HPI/Subjective: 7/4 initially transferred to [facility] long last night, but secondary to increasing right lower extremity pain transferred back to [facility]. Patient received CTA abdomen pelvis and bilateral lower extremities and was found to have a recurrent acute right lower extremity thrombus (see below). Taken for emergent thrombectomy (see below). A./O. x4, continued to complain of chest pain (large mass in chest), continues to complain of quality of the staff just wanted to keep his jobs.    Assessment/Plan: Metastatic adenocarcinoma (probable lung primary) -Will await surgical clearance for patient to return to [facility] for continuation of therapy per oncology  Pulmonary mass -Treatment per oncology  Hyponatremia -Improving, continue to monitor asymptomatic  Protein calorie malnutrition severe -Continue to encourage patient to eat. -Ensure Complete po TID  Recurrent  DVT -Duplex scan showed initial clot in the deep femoral artery on the right and also in the superficial femoral artery. Transferred to [facility] for emergent embolectomy. -S./P.7/2. Right femoral embolectomy, -7/4 CTA abdomen/pelvis/bilateral lower extreme showed recurrent multiple DVT (see results below) -S/P RLE thrombectomy with fasciotomy. -Will restart treatment dose Lovenox when cleared by surgery.  Substance abuse,     Code Status: FULL Family Communication: no family present at time of exam Disposition Plan: Transfer to [facility] when cleared by surgery    Consultants: Dr. Harrell Gave Dixon/Dr. Adele Barthel (vascular surgery)   Procedure/Significant Events: 6/16 CT angiogram chest PE protocol - No pulmonary embolus or acute aortic abnormality.  - Mild progression of hilar/ mediastinal adenopathy along with LUL pulmonary mass measuring 33 mm x 20 mm. 6/24 PCXR;Increasing bilat. perihilar mass/infiltration with suggestion focal developing infiltration Lt lung base. 6/25 CT chest without contrast; No definitive change left suprahilar mass/bilateral hilar and mediastinal lymphadenopathy. Inc infiltration/ atelectasis lung bases. Diffuse emphysematous changes. 7/2. Right femoral embolectomy,  Intraoperative arteriogram, Vein patch angioplasty of the right common femoral artery; material retrieved appeared to be more like potential carcinoma vs acute clot. 7/3 CT angiogram abdomen/pelvis/bilateral lower extremity -Complete occlusion Rt superficial femoral artery, w/abrupt contrast cut off just beyond the profunda femoris takeoff.  -Remainder of the right lower extremity arterial system is  unopacified.  7/4 Right superficial/profunda femoral artery thromboembolectomy; Right popliteal thromboembolectomy. Right anterior tibial artery and posterior tibial artery thromboembolectomy.Four compartment fasciotomy       Culture 6/17 urine negative 6/25 blood right arm/hand  negative  Antibiotics: Cefuroxime 7/2>> Levofloxacin 7/2>> stopped 7/2  DVT prophylaxis: Lovenox   Devices  NA   LINES / TUBES:  7/2 20ga left forearm    Continuous Infusions: . sodium chloride 30 mL/hr at 06/12/14 0216  . heparin 1,200 Units/hr (06/12/14 1700)    Objective: VITAL SIGNS: Temp: 99 F (37.2 C) (07/04 1455) Temp src: Oral (07/04 1455) BP: 126/77 mmHg (07/04 1455) Pulse Rate: 97 (07/04 1455) SPO2; 92% on room FIO2:   Intake/Output Summary (Last 24 hours) at 06/12/14 1756 Last data filed at 06/12/14 1700  Gross per 24 hour  Intake   2646 ml  Output   2160 ml  Net    486 ml     Exam: General: A./O. x4, NAD, No acute respiratory distress, cachectic Lungs: Diffuse coarse breath sounds, negative wheezes or crackles Cardiovascular: Regular rate and rhythm without murmur gallop or rub normal S1 and S2 Abdomen: Nontender, nondistended, soft, bowel sounds positive, no rebound, no ascites, no appreciable mass Extremities: Right lower extremity tender palpation, erythematous, swollen, surgical incision sites covered and clean, palpable DP/PT pulse      Data Reviewed: Basic Metabolic Panel:  Recent Labs Lab 06/08/14 0420 06/10/14 0438 06/10/14 1925 06/11/14 0319 06/12/14 0250 06/12/14 0934  NA 128* 132*  --  131* 134*  --   K 4.2 4.0  --  4.0 5.6* 4.1  CL 91* 93*  --  93* 95*  --   CO2 25 30  --  24 26  --   GLUCOSE 125* 110*  --  101* 138*  --   BUN 13 9  --  9 8  --   CREATININE 0.59 0.64 0.54 0.58 0.58  --   CALCIUM 9.2 9.1  --  8.8 9.1  --    Liver Function Tests:  Recent Labs Lab 06/11/14 0319 06/12/14 0250  AST 14 19  ALT 7 8  ALKPHOS 75 69  BILITOT 0.3 0.2*  PROT 6.8 6.6  ALBUMIN 2.6* 2.7*   No results found for this basename: LIPASE, AMYLASE,  in the last 168 hours No results found for this basename: AMMONIA,  in the last 168 hours CBC:  Recent Labs Lab 06/08/14 0420 06/10/14 0438 06/10/14 1925 06/11/14 0319  06/12/14 0250  WBC 6.9 4.9 5.9 5.8 7.0  HGB 13.6 13.3 13.2 12.8* 11.6*  HCT 39.1 38.2* 38.1* 36.8* 34.0*  MCV 92.0 91.0 91.6 91.5 91.2  PLT 366 341 316 324 271   Cardiac Enzymes: No results found for this basename: CKTOTAL, CKMB, CKMBINDEX, TROPONINI,  in the last 168 hours BNP (last 3 results)  Recent Labs  05/25/14 2127 06/02/14 2211  PROBNP 109.9 144.3*   CBG: No results found for this basename: GLUCAP,  in the last 168 hours  Recent Results (from the past 240 hour(s))  CULTURE, BLOOD (ROUTINE X 2)     Status: None   Collection Time    06/03/14 12:28 AM      Result Value Ref Range Status   Specimen Description BLOOD RIGHT ARM   Final   Special Requests BOTTLES DRAWN AEROBIC AND ANAEROBIC 10CC EACH   Final   Culture  Setup Time     Final   Value: 06/03/2014 09:24     Performed at [facility]   Culture     Final   Value: NO GROWTH 5 DAYS     Performed at [facility]   Report Status 06/09/2014 FINAL   Final  CULTURE, BLOOD (ROUTINE X 2)     Status: None   Collection Time  06/03/14 12:38 AM      Result Value Ref Range Status   Specimen Description BLOOD RIGHT HAND   Final   Special Requests BOTTLES DRAWN AEROBIC AND ANAEROBIC [facility]   Final   Culture  Setup Time     Final   Value: 06/03/2014 09:25     Performed at [facility]   Culture     Final   Value: NO GROWTH 5 DAYS     Performed at [facility]   Report Status 06/09/2014 FINAL   Final     Studies:  Recent x-ray studies have been reviewed in detail by the Attending Physician  Scheduled Meds:  Scheduled Meds: . docusate sodium  100 mg Oral BID  . pantoprazole  40 mg Oral Daily  . potassium chloride  20-40 mEq Oral Once    Time spent on care of this patient: 40 mins   Allie Bossier , MD   [facility] Office  [phone] Pager [phone]  On-Call/Text Page:      [email or web address]      password [email or web address]  If 7PM-7AM, please contact  night-coverage [email or web address] Password [email or web address] 06/12/2014, 5:56 PM   LOS: 10 days

## 2014-06-12 NOTE — Transfer of Care (Signed)
Immediate Anesthesia Transfer of Care Note  Patient: Philip Andrews  Procedure(s) Performed: Procedure(s): Right Femoral, Popliteal, Tibial Artery Embolectomy; Four Compartment Fasciotomy (Right)  Patient Location: PACU  Anesthesia Type:General  Level of Consciousness: awake, alert , oriented and patient cooperative  Airway & Oxygen Therapy: Patient Spontanous Breathing and Patient connected to nasal cannula oxygen  Post-op Assessment: Report given to PACU RN, Post -op Vital signs reviewed and stable and Patient moving all extremities X 4  Post vital signs: Reviewed and stable  Complications: No apparent anesthesia complications

## 2014-06-13 DIAGNOSIS — I82409 Acute embolism and thrombosis of unspecified deep veins of unspecified lower extremity: Secondary | ICD-10-CM

## 2014-06-13 LAB — CBC
HCT: 31.8 % — ABNORMAL LOW (ref 39.0–52.0)
Hemoglobin: 11 g/dL — ABNORMAL LOW (ref 13.0–17.0)
MCH: 31.3 pg (ref 26.0–34.0)
MCHC: 34.6 g/dL (ref 30.0–36.0)
MCV: 90.3 fL (ref 78.0–100.0)
Platelets: 316 10*3/uL (ref 150–400)
RBC: 3.52 MIL/uL — ABNORMAL LOW (ref 4.22–5.81)
RDW: 11.4 % — AB (ref 11.5–15.5)
WBC: 4.9 10*3/uL (ref 4.0–10.5)

## 2014-06-13 LAB — HEPARIN LEVEL (UNFRACTIONATED): Heparin Unfractionated: 0.16 IU/mL — ABNORMAL LOW (ref 0.30–0.70)

## 2014-06-13 MED ORDER — HYDROMORPHONE HCL PF 1 MG/ML IJ SOLN: 1.0000 mg | Freq: Two times a day (BID) | INTRAMUSCULAR | Status: DC | PRN

## 2014-06-13 MED ORDER — SODIUM CHLORIDE 0.9 % IV SOLN
INTRAVENOUS | Status: DC
  Administered 2014-06-13 – 2014-06-16 (×6): via INTRAVENOUS

## 2014-06-13 MED ORDER — HYDROMORPHONE HCL PF 1 MG/ML IJ SOLN: 1.0000 mg | Freq: Once | INTRAMUSCULAR | Status: AC

## 2014-06-13 MED ORDER — HYDROMORPHONE HCL PF 1 MG/ML IJ SOLN
INTRAMUSCULAR | Status: AC
  Filled 2014-06-13: qty 2

## 2014-06-13 MED ORDER — ENOXAPARIN SODIUM 60 MG/0.6ML ~~LOC~~ SOLN
60.0000 mg | Freq: Two times a day (BID) | SUBCUTANEOUS | Status: DC
  Administered 2014-06-13 – 2014-06-17 (×9): 60 mg via SUBCUTANEOUS
  Filled 2014-06-13 (×12): qty 0.6

## 2014-06-13 MED ORDER — HYDROMORPHONE HCL PF 1 MG/ML IJ SOLN
1.0000 mg | INTRAMUSCULAR | Status: DC | PRN
  Administered 2014-06-13 – 2014-06-14 (×6): 2 mg via INTRAVENOUS
  Administered 2014-06-14: 1 mg via INTRAVENOUS
  Administered 2014-06-14: 2 mg via INTRAVENOUS
  Administered 2014-06-14 (×2): 1 mg via INTRAVENOUS
  Administered 2014-06-14 (×8): 2 mg via INTRAVENOUS
  Administered 2014-06-15: 1 mg via INTRAVENOUS
  Administered 2014-06-15 (×5): 2 mg via INTRAVENOUS
  Administered 2014-06-15: 1 mg via INTRAVENOUS
  Administered 2014-06-15 – 2014-06-16 (×5): 2 mg via INTRAVENOUS
  Administered 2014-06-17 (×2): 1 mg via INTRAVENOUS
  Administered 2014-06-17: 2 mg via INTRAVENOUS
  Filled 2014-06-13 (×2): qty 2
  Filled 2014-06-13: qty 1
  Filled 2014-06-13 (×8): qty 2
  Filled 2014-06-13: qty 1
  Filled 2014-06-13 (×9): qty 2
  Filled 2014-06-13: qty 1
  Filled 2014-06-13: qty 2
  Filled 2014-06-13: qty 1
  Filled 2014-06-13 (×8): qty 2

## 2014-06-13 NOTE — Progress Notes (Signed)
Pt c/o severe pain in RLE despite oxycodone and dilaudid given. DP,PT , and AT pulse unable to dopplar. Notified Dr Bridgett Larsson and Dr. Sherral Hammers. Orders received.Rella Larve 1mg  dilaudid and NS 100 by Dr Sherral Hammers. Dr Bridgett Larsson to come see pt. Updated Manuela Schwartz at [facility] 4 Kathrene Bongo, Farley Ly RN

## 2014-06-13 NOTE — Progress Notes (Signed)
Report called to Irwinton Cumings RN. Susie Cassette RN

## 2014-06-13 NOTE — Progress Notes (Signed)
ANTICOAGULATION CONSULT NOTE - Follow Up Consult  Pharmacy Consult for heparin Indication: DVT  Labs:  Recent Labs  06/10/14 1925 06/11/14 0319 06/12/14 0250 06/12/14 1125 06/12/14 2120 06/13/14 0244  HGB 13.2 12.8* 11.6*  --   --  11.0*  HCT 38.1* 36.8* 34.0*  --   --  31.8*  PLT 316 324 271  --   --  316  LABPROT  --   --  15.2  --   --   --   INR  --   --  1.20  --   --   --   HEPARINUNFRC  --   --   --  <0.10* <0.10* 0.16*  CREATININE 0.54 0.58 0.58  --   --   --     Assessment: 58yo male remains subtherapeutic on heparin after rate increase; lab drawn ~2hr early.  Goal of Therapy:  Heparin level 0.3-0.7 units/ml   Plan:  Will increase heparin gtt by 1-2 units/kg/hr to 1600 units/hr and check level with in 6hr; f/u w/ daytime team regarding transitioning back to LMWH.  Wynona Neat, PharmD, BCPS  06/13/2014,3:51 AM

## 2014-06-13 NOTE — Progress Notes (Addendum)
   Pt was doing fine until ~1 hour ago when he had recurrence of his prior pain.  On exam, the prior palpable pulses are gone again.  On his two prior procedures, he had evidence of embolization of material suspicious for tumor.  I have the same suspicions given the relative lack of extensive peripheral arterial disease found on exam intraoperatively.    - In my opinion, this repeat embolization vs reocclusion on full therapeutic Lovenox is a manifestation of his Stage IV cancer - A repeat procedure in this patient is futile as I doubt it will change the course of his remaining life. - I agree with his Oncologist that he is best served with comfort measures and pursuit of hospice. - I don't think a palliative amputation is meaningful in this patient, as there is nothing from prevent his other limbs from thrombosing subsequently. - I think he is better managed at the [facility] - Available as needed.  Adele Barthel, MD [facility] Office: [phone] Pager: [phone]  06/13/2014, 6:23 PM

## 2014-06-13 NOTE — Progress Notes (Signed)
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 He had visits on 6/5 and 6/11 to discuss chemo planning and no showed both visits. Staging CT on 6/3 that showed mild progression of disease, of which he was to discuss treatment for at the visits he missed  He has had severe L sided CP w/ slight nausea for weeks he said. Relieved w/ dilaudid so not currently present. No radiation. No emesis. No diaphoresis. Some Dypnea but has lung carcinoma and the pain is worse w/ deep breathing . CTA in ED negative for PE or PNA.  His vitals are stable and labs are relatively unremarkable. He will be admitted for ACS r/o, hydration b/c orthostatic in ED, and discussion of further planning for carcinoma w/ oncology  7/4 initially transferred to [facility] long last night, but secondary to increasing right lower extremity pain transferred back to [facility]. Patient received CTA abdomen pelvis and bilateral lower extremities and was found to have a recurrent acute right lower extremity thrombus (see below). Taken for emergent thrombectomy (see below). A./O. x4, continued to complain of chest pain (large mass in chest), continues to complain of quality of the staff just wanted to keep his jobs.   HPI/Subjective: 7/5 patient anxious to be transferred back to [facility]    Assessment/Plan: Metastatic adenocarcinoma (probable lung primary) -7/5 surgery has cleared patient for return to [facility] for continuation of therapy per oncology  Pulmonary mass -Treatment per oncology  Hyponatremia -Improving, continue to monitor asymptomatic  Protein calorie malnutrition severe -Continue to encourage  patient to eat. -Ensure Complete po TID  Recurrent DVT -Duplex scan showed initial clot in the deep femoral artery on the right and also in the superficial femoral artery. Transferred to [facility] for emergent embolectomy. -S./P.7/2. Right femoral embolectomy, -7/4 CTA abdomen/pelvis/bilateral lower extreme showed recurrent multiple DVT (see results below) -S/P RLE thrombectomy with fasciotomy. -Will restart treatment dose Lovenox    Substance abuse,     Code Status: FULL Family Communication: no family present at time of exam Disposition Plan: Transfer to [facility]; cleared by surgery    Consultants: Dr. Harrell Gave Dixon/Dr. Adele Barthel (vascular surgery)   Procedure/Significant Events: 6/16 CT angiogram chest PE protocol - No pulmonary embolus or acute aortic abnormality.  - Mild progression of hilar/ mediastinal adenopathy along with LUL pulmonary mass measuring 33 mm x 20 mm. 6/24 PCXR;Increasing bilat. perihilar mass/infiltration with suggestion focal developing infiltration Lt lung base. 6/25 CT chest without contrast; No definitive change left suprahilar mass/bilateral hilar and mediastinal lymphadenopathy. Inc infiltration/ atelectasis lung bases. Diffuse emphysematous changes. 7/2. Right femoral embolectomy,  Intraoperative arteriogram, Vein patch angioplasty of the right common femoral artery; material retrieved appeared to be more like potential carcinoma vs acute clot. 7/3 CT angiogram abdomen/pelvis/bilateral lower extremity -Complete occlusion Rt superficial femoral artery, w/abrupt contrast cut off just beyond the profunda femoris takeoff.  -Remainder of the right lower extremity arterial system is  unopacified.  7/4 Right superficial/profunda femoral artery thromboembolectomy; Right popliteal thromboembolectomy. Right anterior tibial artery and posterior tibial artery thromboembolectomy.Four compartment fasciotomy       Culture 6/17 urine negative 6/25 blood  right arm/hand negative  Antibiotics: Cefuroxime 7/2>> Levofloxacin 7/2>> stopped 7/2  DVT prophylaxis: Lovenox   Devices NA   LINES / TUBES:  7/2 20ga left forearm    Continuous Infusions: . sodium chloride 30 mL/hr at 06/13/14 6659    Objective: VITAL SIGNS: Temp: 98.2 F (36.8 C) (07/05 1100) Temp src: Oral (07/05 1100) BP: 122/73 mmHg (07/05 1150) Pulse Rate: 98 (07/05 1150) SPO2; 92% on room FIO2:   Intake/Output Summary (Last 24 hours) at 06/13/14 1524 Last data filed at 06/13/14 1031  Gross per 24 hour  Intake    753 ml  Output   1450 ml  Net   -697 ml     Exam: General: A./O. x4, NAD, No acute respiratory distress, cachectic Lungs: Diffuse coarse breath sounds, negative wheezes or crackles Cardiovascular: Regular rate and rhythm without murmur gallop or rub normal S1 and S2 Abdomen: Nontender, nondistended, soft, bowel sounds positive, no rebound, no ascites, no appreciable mass Extremities: Right lower extremity tender palpation, erythematous, swollen but improved from 7/4, surgical incision sites. Negative side effects and bilateral surgical site (fasciotomy) staples in place , palpable DP/PT pulse      Data Reviewed: Basic Metabolic Panel:  Recent Labs Lab 06/08/14 0420 06/10/14 0438 06/10/14 1925 06/11/14 0319 06/12/14 0250 06/12/14 0934  NA 128* 132*  --  131* 134*  --   K 4.2 4.0  --  4.0 5.6* 4.1  CL 91* 93*  --  93* 95*  --   CO2 25 30  --  24 26  --   GLUCOSE 125* 110*  --  101* 138*  --   BUN 13 9  --  9 8  --   CREATININE 0.59 0.64 0.54 0.58 0.58  --   CALCIUM 9.2 9.1  --  8.8 9.1  --    Liver Function Tests:  Recent Labs Lab 06/11/14 0319 06/12/14 0250  AST 14 19  ALT 7 8  ALKPHOS 75 69  BILITOT 0.3 0.2*  PROT 6.8 6.6  ALBUMIN 2.6* 2.7*   No results found for this basename: LIPASE, AMYLASE,  in the last 168 hours No results found for this basename: AMMONIA,  in the last 168 hours CBC:  Recent Labs Lab  06/10/14 0438 06/10/14 1925 06/11/14 0319 06/12/14 0250 06/13/14 0244  WBC 4.9 5.9 5.8 7.0 4.9  HGB 13.3 13.2 12.8* 11.6* 11.0*  HCT 38.2* 38.1* 36.8* 34.0* 31.8*  MCV 91.0 91.6 91.5 91.2 90.3  PLT 341 316 324 271 316   Cardiac Enzymes: No results found for this basename: CKTOTAL, CKMB, CKMBINDEX, TROPONINI,  in the last 168 hours BNP (last 3 results)  Recent Labs  05/25/14 2127 06/02/14 2211  PROBNP 109.9 144.3*   CBG: No results found for this basename: GLUCAP,  in the last 168 hours  No results found for this or any previous visit (from the past 240 hour(s)).   Studies:  Recent x-ray studies have been reviewed in detail by the Attending Physician  Scheduled Meds:  Scheduled Meds: . docusate sodium  100 mg Oral BID  . enoxaparin (LOVENOX) injection  60 mg Subcutaneous Q12H  . pantoprazole  40 mg Oral Daily  . potassium chloride  20-40 mEq Oral Once    Time spent on care of this patient: 40 mins   Allie Bossier , MD   [facility] Office  [phone] Pager [phone]  On-Call/Text Page:      [email or web address]      password [email or web address]  If 7PM-7AM, please contact night-coverage [email or web address] Password [email or web address] 06/13/2014, 3:24 PM  LOS: 11 days

## 2014-06-13 NOTE — Progress Notes (Signed)
Patient's son, Philip Andrews, called and updated on patient to be transferred to [place]. No questions at this time.

## 2014-06-13 NOTE — Progress Notes (Signed)
    Philip Andrews is a 58 y.o. male with stage IV lung cancer was admitted to [facility] for palliative radiation therapy for a Left pleural metastasis.  While there, he develop Right femoral occlusion.  Dr. Scot Dock brought him back to the OR for a femoral embolectomy with vein patch angioplasty.  Post-operatively, he appeared to stable with dopplerable signals in the legs.  Unfortunately, POD #1, he occluded his superficial femoral artery, popliteal artery and tibial arteries.  He went back to the OR at [facility] for a right leg thromboembolectomy and four compartment fasciotomy.  The patient has done well post-operatively with no obvious active bleeding on Heparin drip.  At this point, he is stable for transition to Lovenox for cancer related thrombophilia.  He can be transferred back to [facility] for resumption of his palliative radiation therapy.  If this patient reoccludes, he is no further a candidate for reintervention and only a palliative amputation would be entertained.  The transfer back to [facility] has been discussed with Dr. Sherral Hammers via my PA.  Adele Barthel, MD [facility] [place] Office: [phone] Pager: [phone]  06/13/2014, 8:35 AM

## 2014-06-13 NOTE — Progress Notes (Addendum)
ANTICOAGULATION CONSULT NOTE - Initial Consult  Pharmacy Consult for lovenox Indication: DVT  Allergies  Allergen Reactions  . Benadryl [Diphenhydramine Hcl]     Pt states he just knows he's allergic    Patient Measurements: Height: 5\' 9"  (175.3 cm) Weight: 134 lb 4.2 oz (60.9 kg) IBW/kg (Calculated) : 70.7 Heparin Dosing Weight:   Vital Signs: Temp: 98.7 F (37.1 C) (07/05 0308) Temp src: Oral (07/05 0308) BP: 129/67 mmHg (07/05 0357) Pulse Rate: 93 (07/05 0357)  Labs:  Recent Labs  06/10/14 1925 06/11/14 0319 06/12/14 0250 06/12/14 1125 06/12/14 2120 06/13/14 0244  HGB 13.2 12.8* 11.6*  --   --  11.0*  HCT 38.1* 36.8* 34.0*  --   --  31.8*  PLT 316 324 271  --   --  316  LABPROT  --   --  15.2  --   --   --   INR  --   --  1.20  --   --   --   HEPARINUNFRC  --   --   --  <0.10* <0.10* 0.16*  CREATININE 0.54 0.58 0.58  --   --   --     Estimated Creatinine Clearance: 85.6 ml/min (by C-G formula based on Cr of 0.58).   Medical History: Past Medical History  Diagnosis Date  . Substance abuse     per H&P  . Lung nodules   . Lung nodules   . Gastric ulcer   . History of radiation therapy 11/26/13-12/15/13    30 gray to right lower posterior chest wall mass  . 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 dexamethasone (DECADRON) 4 MG tablet Take 2.5 tabs (10 mg) at bedtime the night before first chemotherapy and at 6am the day of first chemotherapy.  5 tablet  0  . ibuprofen (ADVIL,MOTRIN) 600 MG tablet Take 1 tablet (600 mg total) by mouth every 8 (eight) hours as needed.  30 tablet  1  . Ipratropium-Albuterol (COMBIVENT) 20-100 MCG/ACT AERS respimat Inhale 1 puff into the lungs every 6 (six) hours as needed for wheezing or shortness of breath.  1 Inhaler  2  . ondansetron (ZOFRAN) 8 MG tablet Take 1 tablet (8 mg total) by mouth every 8 (eight) hours as needed for nausea or  vomiting.  20 tablet  1  . oxyCODONE-acetaminophen (PERCOCET/ROXICET) 5-325 MG per tablet Take 2 tablets by mouth every 6 (six) hours as needed for severe pain.  40 tablet  0  . prochlorperazine (COMPAZINE) 10 MG tablet Take 1 tablet (10 mg total) by mouth every 6 (six) hours as needed for nausea or vomiting.  30 tablet  1   Scheduled:  . docusate sodium  100 mg Oral BID  . pantoprazole  40 mg Oral Daily  . potassium chloride  20-40 mEq Oral Once   Infusions:  . sodium chloride 30 mL/hr at 06/13/14 0998    Assessment: 58 yo with metastatic adenocarcinoma who was tx from [facility] for a DVT. He got embolectomy here. Has been on heparin but not transition to lovenox due to CA associated thromboembolism.   Goal of Therapy:  Anti-Xa level 0.6-1 units/ml 4hrs after LMWH dose given Monitor platelets by anticoagulation protocol: Yes   Plan:   Lovenox 60mg  SQ q12 CBC q3days

## 2014-06-13 NOTE — Progress Notes (Signed)
Patient transferred to [facility] by [facility] via stetcher on tele. Belongings sent with patient. Family notified previously.

## 2014-06-13 NOTE — Progress Notes (Signed)
Pt refused SCDS

## 2014-06-13 NOTE — Progress Notes (Signed)
   Daily Progress Note  Assessment/Planning: POD #2 s/p Repeat R leg thrombembolectomy, R 4 compartment fasciotomies for stage IV lung cancer related thrombophilia   No bleeding complications: ok to chg to Lovenox  Staples out in 2 weeks  Pt can be transferred back to [facility] for resumption of palliative rad-onc treatment of mets    Subjective  - 2 Days Post-Op  Pain better  Objective Filed Vitals:   06/12/14 1953 06/12/14 2304 06/13/14 0308 06/13/14 0357  BP: 107/65 108/64 99/58 129/67  Pulse: 106 103 111 93  Temp: 98.7 F (37.1 C) 98.2 F (36.8 C) 98.7 F (37.1 C)   TempSrc: Oral Oral Oral   Resp: 24 24 20 27   Height:      Weight:      SpO2: 93% 95% 94% 94%    Intake/Output Summary (Last 24 hours) at 06/13/14 0806 Last data filed at 06/13/14 0600  Gross per 24 hour  Intake    857 ml  Output   2400 ml  Net  -1543 ml    PULM  BLL rales improves CV  RRR GI  soft, NTND VASC  R calf soft, medial and lateral inc: c/d/i with intact staple line, no bleeding from either incision, easily palpable PT and AT pulse this AM; R groin: c/d/i, staples in place  Laboratory CBC    Component Value Date/Time   WBC 4.9 06/13/2014 0244   WBC 3.6* 05/12/2014 1341   HGB 11.0* 06/13/2014 0244   HGB 14.4 05/12/2014 1341   HCT 31.8* 06/13/2014 0244   HCT 41.6 05/12/2014 1341   PLT 316 06/13/2014 0244   PLT 241 05/12/2014 1341    BMET    Component Value Date/Time   NA 134* 06/12/2014 0250   NA 138 05/12/2014 1342   K 4.1 06/12/2014 0934   K 3.7 05/12/2014 1342   CL 95* 06/12/2014 0250   CO2 26 06/12/2014 0250   CO2 22 05/12/2014 1342   GLUCOSE 138* 06/12/2014 0250   GLUCOSE 94 05/12/2014 1342   BUN 8 06/12/2014 0250   BUN 10.8 05/12/2014 1342   CREATININE 0.58 06/12/2014 0250   CREATININE 0.8 05/12/2014 1342   CALCIUM 9.1 06/12/2014 0250   CALCIUM 9.2 05/12/2014 1342   GFRNONAA >90 06/12/2014 0250   GFRAA >90 06/12/2014 0250    Adele Barthel, MD [facility] Office:  [phone] Pager: [phone]  06/13/2014, 8:06 AM

## 2014-06-14 ENCOUNTER — Telehealth: Payer: Self-pay | Admitting: Vascular Surgery

## 2014-06-14 ENCOUNTER — Encounter (HOSPITAL_COMMUNITY): Payer: Self-pay | Admitting: Vascular Surgery

## 2014-06-14 ENCOUNTER — Ambulatory Visit
Admit: 2014-06-14 | Discharge: 2014-06-14 | Disposition: A | Discharge status: Home / Self Care | Payer: Medicaid Other | Attending: Radiation Oncology | Admitting: Radiation Oncology

## 2014-06-14 ENCOUNTER — Telehealth: Payer: Self-pay | Admitting: *Deleted

## 2014-06-14 ENCOUNTER — Ambulatory Visit: Payer: Medicaid Other | Admitting: Oncology

## 2014-06-14 DIAGNOSIS — D6869 Other thrombophilia: Secondary | ICD-10-CM | POA: Diagnosis present

## 2014-06-14 DIAGNOSIS — R636 Underweight: Secondary | ICD-10-CM

## 2014-06-14 DIAGNOSIS — C801 Malignant (primary) neoplasm, unspecified: Secondary | ICD-10-CM

## 2014-06-14 DIAGNOSIS — I743 Embolism and thrombosis of arteries of the lower extremities: Secondary | ICD-10-CM

## 2014-06-14 DIAGNOSIS — J189 Pneumonia, unspecified organism: Secondary | ICD-10-CM | POA: Diagnosis present

## 2014-06-14 DIAGNOSIS — I999 Unspecified disorder of circulatory system: Secondary | ICD-10-CM

## 2014-06-14 DIAGNOSIS — R0789 Other chest pain: Secondary | ICD-10-CM

## 2014-06-14 DIAGNOSIS — E871 Hypo-osmolality and hyponatremia: Secondary | ICD-10-CM | POA: Diagnosis present

## 2014-06-14 DIAGNOSIS — C3490 Malignant neoplasm of unspecified part of unspecified bronchus or lung: Secondary | ICD-10-CM

## 2014-06-14 MED ORDER — GUAIFENESIN-DM 100-10 MG/5ML PO SYRP: 5.0000 mL | ORAL_SOLUTION | ORAL | Status: DC | PRN

## 2014-06-14 MED ORDER — ENSURE COMPLETE PO LIQD
237.0000 mL | Freq: Three times a day (TID) | ORAL | Status: DC
  Administered 2014-06-14 – 2014-06-16 (×7): 237 mL via ORAL

## 2014-06-14 MED ORDER — OXYCODONE HCL ER 20 MG PO T12A
40.0000 mg | EXTENDED_RELEASE_TABLET | Freq: Two times a day (BID) | ORAL | Status: DC
  Administered 2014-06-14 – 2014-06-16 (×5): 40 mg via ORAL
  Filled 2014-06-14 (×5): qty 2

## 2014-06-14 MED ORDER — ALPRAZOLAM 1 MG PO TABS
1.0000 mg | ORAL_TABLET | Freq: Every day | ORAL | Status: DC
  Administered 2014-06-14 – 2014-06-16 (×3): 1 mg via ORAL
  Filled 2014-06-14 (×3): qty 1

## 2014-06-14 NOTE — Progress Notes (Addendum)
Progress Note   Philip Andrews DOB: 1956-03-22 DOA: 06/02/2014 PCP: No PCP Per Patient   Brief Narrative:   Philip Andrews is an 58 y.o. 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  On 06/10/14, the patient developed acute onset of pain in his right second, third, and fourth toes. A duplex scan was done which showed clot in the deep femoral artery on the right as well as the mid femoral artery. He was transferred to [facility] and underwent a right femoral embolectomy on the same day. The patient was cleared by vascular surgery to return to [facility] to resume XRT on 06/11/14. After he returned to [facility], he developed worsening pain and a CT angiogram demonstrated SFA and popliteal occlusion requiring an urgent right thrombectomy (transferred back to [facility] for this). Developed signs of reocclusion on 06/13/14, but no further embolectomy or amputation recommended by vascular surgery given his overall poor prognosis. He is now back at [facility].  Assessment/Plan:   Principal Problem:   Lung cancer Metastatic primary lung cancer with chest pain /cancer of anterior mediastinum  Followed by Dr. Benay Spice, completed 2 cycles of chemotherapy and declined further treatment.  Restaging CT of the chest 05/12/14 revealed mild progression of disease.  Dr. Benay Spice following the patient and initially recommended hospice care.  Seen by Dr. Lisbeth Renshaw of radiation oncology 06/04/14 with initiation of his first radiation treatment on 06/07/14, 30 gray in 10 fractions planned, which is being resumed today and will be completed 06/18/14.   I believe it is the patient's best interest to have palliative care involved given his complex home situation, pain management needs, and need for good discharge planning, and  as such I am calling a consult.  Active Problems:   Acute respiratory failure with hypoxia /suspected community-acquired pneumonia  Initially felt to be secondary to community-acquired pneumonia, with initial chest radiograph showing increasing bilateral perihilar mass versus infiltration with a suggestion of focal developing infiltration in the left lung base. Infiltrates felt to represent spread of tumor by Dr. Benay Spice.  Treated with Rocephin/azithromycin x7 days as well as supplemental oxygen support.    Protein-calorie malnutrition, severe / Underweight  Pt meets criteria for severe MALNUTRITION in the context of chronic illness as evidenced by PO intake <75% for one month, severe muscle wasting and subcutaneous fat loss, 7.4% body weight loss in one month.  Seen by dietitian 06/03/14. Continue nutritional supplements.      Ischemia of toe/Embolism, arterial, leg, right / acquired thrombophilia secondary malignancy  On 06/10/14, the patient developed acute onset of pain in his right second, third, and fourth toes. A duplex scan was done which showed clot in the deep femoral artery on the right as well as the mid femoral artery.   Transferred to [facility] and underwent a right femoral embolectomy 06/10/14.  Underwent further embolectomy of multiple vessels along with a 4 compartment fasciotomy 06/12/14 for recurrent symptoms.  Pathology from surgery pending.  Initially treated with heparin postoperatively, now on treatment dose Lovenox.  Developed symptoms of reocclusion 06/23/14 but is not a candidate for further surgical intervention for vascular surgery notes. Comfort care/palliation recommended.    Hyponatremia   Mild. 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    DVT Prophylaxis  Continue Lovenox.  Code  Status: Full. Family Communication: Son at bedside. Disposition Plan: Home with hospice vs residential hospice when stable.   IV Access:    Peripheral IV   Procedures:    ABIs  06/10/14: Right 1.02, left 1.05.  Arterial duplex 06/10/14: Positive for clot in the deep femoral artery and superficial femoral artery on the right.  Right femoral embolectomy, intraoperative arteriogram, vein patch angioplasty of the right common femoral artery 06/10/14 by Dr. Scot Dock.  Right superficial and profunda femoral artery thromboembolectomy, right popliteal thromboembolectomy, right anterior tibial artery and posterior tibial artery thromboembolectomy, 4 compartment fasciotomy by Dr. Bridgett Larsson 06/12/14.   Medical Consultants:    Dr. Julieanne Manson, Oncology.  Dr. Marye Round, XRT.  Dr. Deitra Mayo and Dr. Adele Barthel, Vascular Surgery.   Other Consultants:    Physical therapy: SNF recommended.  Occupational therapy: No OT followup recommended.  Dietitian   Anti-Infectives:    Azithromycin 06/02/14---> 06/10/14  Rocephin 06/03/14---> 06/10/14  Levaquin 06/10/14---> 06/12/14  Cefuroxime 06/10/14---> 06/12/14  Subjective:   Philip Andrews is complaining of right leg pain.  He is intermittently falling asleep during my interview, and a son is at the bedside, who he is interacting with. No nausea or vomiting.  Bowels have moved twice in past 24 hours.  Objective:    Filed Vitals:   06/13/14 1541 06/13/14 1926 06/13/14 2109 06/14/14 0533  BP: 105/63 157/83 148/89 118/84  Pulse:  116 114 121  Temp: 98.9 F (37.2 C) 98 F (36.7 C) 98.8 F (37.1 C) 99 F (37.2 C)  TempSrc: Oral Oral Oral Oral  Resp:  21 20 16   Height:   5\' 9"  (1.753 m)   Weight:   58.5 kg (128 lb 15.5 oz)   SpO2:  94% 92% 92%    Intake/Output Summary (Last 24 hours) at 06/14/14 0838 Last data filed at 06/14/14 0801  Gross per 24 hour  Intake   1846 ml  Output    225 ml  Net   1621 ml    Exam: Gen:  NAD Cardiovascular:  Tachycardic, No M/R/G Respiratory:  Lungs CTAB Gastrointestinal:  Abdomen soft, NT/ND, + BS Extremities:  No C/E/C, staples RLE without signs of infection   Data  Reviewed:    Labs: Basic Metabolic Panel:  Recent Labs Lab 06/08/14 0420 06/10/14 0438 06/10/14 1925 06/11/14 0319 06/12/14 0250 06/12/14 0934  NA 128* 132*  --  131* 134*  --   K 4.2 4.0  --  4.0 5.6* 4.1  CL 91* 93*  --  93* 95*  --   CO2 25 30  --  24 26  --   GLUCOSE 125* 110*  --  101* 138*  --   BUN 13 9  --  9 8  --   CREATININE 0.59 0.64 0.54 0.58 0.58  --   CALCIUM 9.2 9.1  --  8.8 9.1  --    GFR Estimated Creatinine Clearance: 82.3 ml/min (by C-G formula based on Cr of 0.58). Liver Function Tests:  Recent Labs Lab 06/11/14 0319 06/12/14 0250  AST 14 19  ALT 7 8  ALKPHOS 75 69  BILITOT 0.3 0.2*  PROT 6.8 6.6  ALBUMIN 2.6* 2.7*   Coagulation profile  Recent Labs Lab 06/12/14 0250  INR 1.20    CBC:  Recent Labs Lab 06/10/14 0438 06/10/14 1925 06/11/14 0319 06/12/14 0250 06/13/14 0244  WBC 4.9 5.9 5.8 7.0 4.9  HGB 13.3 13.2 12.8* 11.6* 11.0*  HCT 38.2* 38.1* 36.8* 34.0*  31.8*  MCV 91.0 91.6 91.5 91.2 90.3  PLT 341 316 324 271 316   BNP (last 3 results)  Recent Labs  05/25/14 2127 06/02/14 2211  PROBNP 109.9 144.3*     Radiographs/Studies:   Ct Chest Wo Contrast  06/14/2014   ADDENDUM REPORT: 14-Jun-2014 14:11  ADDENDUM: Dr. Benay Spice stopped by and reported that this patient is having severe, pleuritic left paramidline anterior chest wall pain. Careful review of this exam does not show definite pericardial or left anterior parasternal soft tissue disease. However, assessment is hindered by the lack of intravenous contrast material. When reviewing the study from 05/25/2014 performed with intravenous contrast, there is a subtle 16 mm soft tissue nodule seen just anterior to the right ventricle (see image 81 of series 4 from the 05/25/2014 CT of the chest) consistent with pericardial disease. This would be consistent with a metastatic deposit and a likely etiology for the patient's chest pain as Dr. Benay Spice states that this correlates  directly with the location of the patient's symptoms.   Electronically Signed   By: Misty Stanley M.D.   On: 2014-06-14 14:11   2014-06-14   CLINICAL DATA:  Pneumonia versus progressive lung cancer.  EXAM: CT CHEST WITHOUT CONTRAST  TECHNIQUE: Multidetector CT imaging of the chest was performed following the standard protocol without IV contrast.  COMPARISON:  05/25/2014  FINDINGS: Examination is technically limited for evaluation of hilar and mediastinal structures without IV contrast material.  Cardiac enlargement. Normal caliber thoracic aorta. Probable vascular congestion. Bilateral hilar and probable mediastinal lymphadenopathy, better visualized on previous contrast enhanced study. Airways remain patent although there is evidence of mucous in distal trachea and mild narrowing of the left lower lobe bronchus probably due to extrinsic compression. Left suprahilar mass lesion again demonstrated, measuring 3.2 bone 1.9 cm, similar to prior study. Diffuse emphysematous changes in the lungs. Increasing atelectasis or consolidation in the lung bases. No pneumothorax or effusion. Visualized portions of the upper abdominal organs are grossly unremarkable.  IMPRESSION: No definitive change in appearance of left suprahilar mass and bilateral hilar and mediastinal lymphadenopathy although evaluation is limited without contrast material. There appears to be increasing infiltration or atelectasis in the lung bases. Diffuse emphysematous changes.  Electronically Signed: By: Lucienne Capers M.D. On: 06/03/2014 02:47   Ct Angio Chest Pe W/cm &/or Wo Cm  05/25/2014   CLINICAL DATA:  Chest pain.  Shortness of breath.  Chest wall mass.  EXAM: CT ANGIOGRAPHY CHEST WITH CONTRAST  TECHNIQUE: Multidetector CT imaging of the chest was performed using the standard protocol during bolus administration of intravenous contrast. Multiplanar CT image reconstructions and MIPs were obtained to evaluate the vascular anatomy.  CONTRAST:   124mL OMNIPAQUE IOHEXOL 350 MG/ML SOLN  COMPARISON:  05/12/2014.  FINDINGS: Technically adequate study without pulmonary embolus. No acute aortic abnormality. Cardiomegaly is present. Lungs demonstrate bulky bilateral hilar adenopathy and mediastinal adenopathy, compatible with malignant adenopathy. Left upper lobe mass has increased slightly in size, measuring 33 mm AP by 20 mm transverse. Severe emphysema is present. No airspace disease/pneumonia. Prominent dependent atelectasis.  The hilar adenopathy appears to of increased in the short interval since the prior exam, with right hilar node today measuring 26 mm, previously 23 mm. Some of this probably has a do with slice selection. Left hilar node appears similar. Radiodense object present in the left upper quadrant of the abdomen which potentially represents contents in the enteric stream.  Review of the MIP images confirms the above findings.  IMPRESSION: 1. No pulmonary embolus or acute aortic abnormality. 2. Mild progression of higher and mediastinal adenopathy along with left upper lobe pulmonary mass measuring 33 mm x 20 mm.   Electronically Signed   By: Dereck Ligas M.D.   On: 05/25/2014 22:39   Ct Angio Ao+bifem W/cm &/or Wo/cm  06/11/2014   CLINICAL DATA:  History of right common femoral artery embolus. Recurrence of severe right leg pain.  EXAM: CT ANGIOGRAPHY OF ABDOMINAL AORTA WITH ILIOFEMORAL RUNOFF  TECHNIQUE: Multidetector CT imaging of the abdomen, pelvis and lower extremities was performed using the standard protocol during bolus administration of intravenous contrast. Multiplanar CT image reconstructions and MIPs were obtained to evaluate the vascular anatomy.  CONTRAST:  141mL OMNIPAQUE IOHEXOL 350 MG/ML SOLN  COMPARISON:  CT chest 06/03/2014. CT chest abdomen and pelvis 05/12/2014.  FINDINGS: Aorta: Normal caliber. Infrarenal calcifications. Mesenteric vessels and bilateral single renal arteries are patent.  Right Lower Extremity: There is  gas present within the subcutaneous soft tissues of the right growing, presumably related to recent vascular surgery. There is complete occlusion of the right superficial femoral artery just beyond the takeoff of the profunda femoris. No flow noted within the superficial femoral artery, popliteal artery or trifurcation vessels of the right calf.  Left Lower Extremity: Atherosclerotic calcifications in the common iliac artery without stenosis or aneurysm. Left common femoral artery, superficial femoral artery, popliteal artery, and trifurcation vessels and the left calf are unremarkable.  Abdomen/pelvis: Vascular congestion and bibasilar airspace opacities, likely atelectasis or consolidation, similar to prior study. No pleural effusions.  10 mm low-density lesion within the right hepatic lobe on image 71 of series 5 is stable since prior study. Gallbladder, spleen, pancreas, adrenals and kidneys are normal.  Urinary bladder, stomach, large and small bowel grossly unremarkable. No free fluid, free air or adenopathy.  Review of the MIP images confirms the above findings.  IMPRESSION: Complete occlusion of the right superficial femoral artery, with abrupt contrast cut off just beyond the profunda femoris takeoff. Remainder of the right lower extremity arterial system is unopacified.  Continued bilateral lower lobe airspace opacities, left greater than right, atelectasis versus consolidation.  Stable small right hepatic lesion, nonspecific.  Critical Value/emergent results were called by telephone at the time of interpretation on 06/11/2014 at 8:01 PM to Dr. Adele Barthel , who verbally acknowledged these results.   Electronically Signed   By: Rolm Baptise M.D.   On: 06/11/2014 20:01   Dg Ang/ext/uni/or Right  06/10/2014   CLINICAL DATA:  Right femoral embolus.  EXAM: RIGHT ANG/EXT/UNI/ OR  TECHNIQUE: Intraoperative image  CONTRAST:  See operative record.  FLUOROSCOPY TIME:  See operative record.  COMPARISON:  Pelvic CT  05/12/2014  FINDINGS: Single angiographic image of the distal femur and proximal calf were obtained. The distal superficial femoral artery and the popliteal artery are patent. There is three-vessel runoff in the proximal calf. There is no contrast within the mid and distal calf and most likely related to the timing of the image.  IMPRESSION: The distal superficial femoral artery, popliteal artery and proximal calf vessels are patent.   Electronically Signed   By: Markus Daft M.D.   On: 06/10/2014 18:23   Dg Chest Port 1 View  06/02/2014   CLINICAL DATA:  Left-sided chest and arm pain for 1 week. Wrist prior rotatory distress.  EXAM: PORTABLE CHEST - 1 VIEW  COMPARISON:  05/25/2014  FINDINGS: Heart size is normal. Normal pulmonary vascularity. Increasing perihilar mass or infiltration  bilaterally. This corresponds to perihilar lymphadenopathy noted on previous CT chest 05/25/2014. Stable interstitial changes in the lungs with suggestion of focal increasing opacity in the left lung base. Developing pneumonia is not excluded. No blunting of costophrenic angles. No pneumothorax.  IMPRESSION: Increasing bilateral perihilar mass or infiltration with suggestion of focal developing infiltration in the left lung base.   Electronically Signed   By: Lucienne Capers M.D.   On: 06/02/2014 22:36   Dg Chest Port 1 View  05/25/2014   CLINICAL DATA:  58 year old male with chest pain shortness of breath fever cough and weakness. Initial encounter. Metastatic adenocarcinoma.  EXAM: PORTABLE CHEST - 1 VIEW  COMPARISON:  Chest CTA 05/12/2014.  FINDINGS: Portable AP upright view at 2054 hrs. Stable cardiac size and mediastinal contours. Visualized tracheal air column is within normal limits. No pneumothorax, pulmonary edema, or pleural effusion. There is increased streaky left lung base opacity which may be postobstructive in light of the recent CT.  IMPRESSION: Mildly increased streaky left lung base opacity, suspicious for  postobstructive infection in light of the recent chest CT findings an the clinical presentation.   Electronically Signed   By: Lars Pinks M.D.   On: 05/25/2014 21:26    Medications:   . docusate sodium  100 mg Oral BID  . enoxaparin (LOVENOX) injection  60 mg Subcutaneous Q12H  . pantoprazole  40 mg Oral Daily  . potassium chloride  20-40 mEq Oral Once   Continuous Infusions: . sodium chloride 100 mL/hr at 06/14/14 0436    Time spent: 35 minutes, the patient is medically complex and required intensive review of records, coordination of care.   LOS: 12 days   [facility] Pager [phone]. If unable to reach me by pager, please call my cell phone at [phone].  *Please refer to [email or web address], password [email or web address] to get updated schedule on who will round on this patient, as hospitalists switch teams weekly. If 7PM-7AM, please contact night-coverage at [email or web address], password [email or web address] for any overnight needs.  06/14/2014, 8:38 AM    **Disclaimer: This note was dictated with voice recognition software. Similar sounding words can inadvertently be transcribed and this note may contain transcription errors which may not have been corrected upon publication of note.**    Anti-infectives   Start     Dose/Rate Route Frequency Ordered Stop   06/12/14 0215  cefUROXime (ZINACEF) 1.5 g in dextrose 5 % 50 mL IVPB     1.5 g 100 mL/hr over 30 Minutes Intravenous Every 12 hours 06/12/14 0210 06/12/14 1018   06/10/14 1900  cefUROXime (ZINACEF) 1.5 g in dextrose 5 % 50 mL IVPB     1.5 g 100 mL/hr over 30 Minutes Intravenous Every 12 hours 06/10/14 1819 06/11/14 0720   06/10/14 1200  levofloxacin (LEVAQUIN) tablet 500 mg  Status:  Discontinued     500 mg Oral Daily 06/10/14 1100 06/12/14 0210   06/05/14 2200  azithromycin (ZITHROMAX) tablet 500 mg  Status:  Discontinued     500 mg Oral Daily at bedtime 06/05/14 0845 06/10/14 1100   06/04/14 0000  cefTRIAXone (ROCEPHIN) 1 g in dextrose 5 % 50  mL IVPB  Status:  Discontinued     1 g 100 mL/hr over 30 Minutes Intravenous Every 24 hours 06/03/14 1440 06/10/14 1100   06/02/14 2345  cefTRIAXone (ROCEPHIN) 1 g in dextrose 5 % 50 mL IVPB     1 g 100 mL/hr over 30 Minutes Intravenous  Once 06/02/14 2330 06/03/14 0117  06/02/14 2330  azithromycin (ZITHROMAX) 500 mg in dextrose 5 % 250 mL IVPB  Status:  Discontinued     500 mg 250 mL/hr over 60 Minutes Intravenous Every 24 hours 06/02/14 2330 06/05/14 0844

## 2014-06-14 NOTE — Progress Notes (Signed)
Paged hospitalist about absent R pedal/post tib pulses. Referenced note per Vascular surgery. Pt having 10/10 pain in leg and will be receiving 2 mg of dilaudid approximately every hour. Hot packs being replaced under calf PRN. Will continue to monitor.

## 2014-06-14 NOTE — Progress Notes (Signed)
The patient does not want the bed/chair alarms on. The patient has been educated that he is  a fall risk. The patient has been educated that he needs to use the call bell for assistance. The patient continues to get out of the bed without asking for assistance.  The right leg is now draining. An ABD pad will be put on the RLE.

## 2014-06-14 NOTE — Telephone Encounter (Signed)
Called the 4th floor room 1435,spoke with RN Myrna, patient doing better, and he is able to come for radiation treatment via w/c, asked to medicate him for pain prior to 255pm appt, Myrna stated she would , called and spoke with Jennifer,RT linac#3,can treat patient in 1435,thanked Myrna,RN 10:11 AM

## 2014-06-14 NOTE — Progress Notes (Signed)
Pt confused. He believes he is at this RNs house and does not comprehend that he is at the hospital.

## 2014-06-14 NOTE — Progress Notes (Signed)
[facility] Therapy Treatment Record Phone [phone]   Radiation Therapy was administered to Philip Andrews on: 06/14/2014  3:23 PM and was treatment # 1 out of a planned course of 5 treatments.

## 2014-06-14 NOTE — Progress Notes (Signed)
NUTRITION FOLLOW UP  DOCUMENTATION CODES  Per approved criteria   -Severe malnutrition in the context of chronic illness  -Underweight    Pt meets criteria for severe MALNUTRITION in the context of chronic illness as evidenced by PO intake <75% for one month, severe muscle wasting and subcutaneous fat loss, 7.4% body weight loss in one month.  Intervention:   - Recommend to liberalize diet to encourage PO intake - Continue Ensure Complete po TID, each supplement provides 350 kcal and 13 grams of protein   Nutrition Dx:   Inadequate oral intake related to nausea/abd pain/vomiting as evidenced by PO intake <75%, 7.4% body weight loss in one month; ongoing  Goal:   Pt to meet >/= 90% of their estimated nutrition needs; not met  Monitor:   Total protein/energy intake, labs, weight, diet education needs, GI profile  Assessment:   Philip Andrews is a 58 y.o. 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 Has been unable to tolerate solid foods, consuming 3 Ensure Complete daily.  -Encouraged intake of soft high kcal/protein foods- peanut butter, whey powder to mix in w/milk and/or supplements, ice cream, puddings. Pt in agreement to consume milkshake as 8PM nourishment for nutrient replenishment  -Current PO intake is 50%, receiving assistance for meal ordering  -Discussed intake of bland foods and gingerale to assist with nausea  -Provided supplement coupons  -Endorsed an unintentional wt loss of 42 lbs since 10/2013. -Experienced 10 lbs weight loss in last month, or 7.4% body weight loss in one month, which is severe for time frame  -Pt with evident signs of severe muscle wasting and subcutaneous fat loss in temporal, clavicle and acromion regions  6/28: - Pt continues to drink his ensure TID. - Pt reports that he still is experiencing nausea and  pain which is still effecting his appetite. - Pt was provided with nutrition therapy and re-educated on a high-calorie, high-protein diet as well as ways to reduce nausea. - Pt was encouraged to consume small, frequent meals and snacks.  Pt transferred to [facility] 7/2 for emergent right femoral embolectomy due to ischemic right LE. Per MD notes likely was not a clot but material likely carcinoma. Per MD pt is ready to transfer back to [facility] for continued palliative XRT. Per pt he is ready to go back to [facility]. Pt states he does not eat much solid food but loves ensure.  Sodium low.   7/06: -RD contacted by nutrition ambassador concerning diet order and pt expressing frustration in ordering meals -Pt on heart healthy diet and finding it difficult to find foods he enjoys. Would benefit from liberalizing diet as pt with 15% PO intake and weight continuing to trend down. -Pt tolerating and enjoying Ensure. Provided pt with supplement w/ith RD follow up,  confirmed diet had been liberalized and informed pt. Pt refused any additional snacks.  Height: Ht Readings from Last 1 Encounters:  06/13/14 $RemoveB'5\' 9"'PLqffnbe$  (1.753 m)    Weight Status:   Wt Readings from Last 1 Encounters:  06/13/14 128 lb 15.5 oz (58.5 kg)  Admission weight: 130 lb (58.9 kg) 6/24  Re-estimated needs:  Kcal: 1900-2100 Protein: 85-100 g Fluid: 1.9-2.1 L/day  Skin: WNL  Diet Order: General   Intake/Output Summary (Last 24 hours) at 06/14/14 1413 Last data filed at 06/14/14 0801  Gross per 24 hour  Intake   1800 ml  Output    225 ml  Net   1575 ml    Last BM: 7/05   Labs:   Recent Labs Lab 06/10/14 0438 06/10/14 1925 06/11/14 0319 06/12/14 0250 06/12/14 0934  NA 132*  --  131* 134*  --   K 4.0  --  4.0 5.6* 4.1  CL 93*  --  93* 95*  --   CO2 30  --  24 26  --   BUN 9  --  9 8  --   CREATININE 0.64 0.54 0.58 0.58  --   CALCIUM 9.1  --  8.8 9.1  --   GLUCOSE 110*  --  101* 138*  --     CBG (last 3)  No results found  for this basename: GLUCAP,  in the last 72 hours  Scheduled Meds: . docusate sodium  100 mg Oral BID  . enoxaparin (LOVENOX) injection  60 mg Subcutaneous Q12H  . pantoprazole  40 mg Oral Daily  . potassium chloride  20-40 mEq Oral Once    Continuous Infusions: . sodium chloride 100 mL/hr at 06/14/14 [place] LDN Clinical Dietitian [phone]

## 2014-06-14 NOTE — Telephone Encounter (Addendum)
Message copied by Gena Fray on Mon Jun 14, 2014 10:50 AM ------      Message from: Denman George      Created: Thu Jun 10, 2014  3:56 PM      Regarding: Steph log; also needs 2 wk. f/u appt.                   ----- Message -----         From: Gabriel Earing, PA-C         Sent: 06/10/2014   3:42 PM           To: Vvs Charge Pool            S/p embolectomy right femoral artery 06/10/14.  F/u with Dr. Scot Dock in 2 weeks.            Thanks      Samantha ------  06/14/14: no answer, no voicemail, mailed letter, dpm

## 2014-06-15 ENCOUNTER — Ambulatory Visit
Admit: 2014-06-15 | Discharge: 2014-06-15 | Disposition: A | Discharge status: Home / Self Care | Payer: Medicaid Other | Attending: Radiation Oncology | Admitting: Radiation Oncology

## 2014-06-15 DIAGNOSIS — M79609 Pain in unspecified limb: Secondary | ICD-10-CM

## 2014-06-15 LAB — CBC
HCT: 33.2 % — ABNORMAL LOW (ref 39.0–52.0)
Hemoglobin: 11.6 g/dL — ABNORMAL LOW (ref 13.0–17.0)
MCH: 31.4 pg (ref 26.0–34.0)
MCHC: 34.9 g/dL (ref 30.0–36.0)
MCV: 89.7 fL (ref 78.0–100.0)
PLATELETS: 285 10*3/uL (ref 150–400)
RBC: 3.7 MIL/uL — ABNORMAL LOW (ref 4.22–5.81)
RDW: 11.5 % (ref 11.5–15.5)
WBC: 6.4 10*3/uL (ref 4.0–10.5)

## 2014-06-15 LAB — BASIC METABOLIC PANEL
ANION GAP: 13 (ref 5–15)
BUN: 10 mg/dL (ref 6–23)
CO2: 22 mEq/L (ref 19–32)
CREATININE: 0.4 mg/dL — AB (ref 0.50–1.35)
Calcium: 8.4 mg/dL (ref 8.4–10.5)
Chloride: 91 mEq/L — ABNORMAL LOW (ref 96–112)
GFR calc non Af Amer: >90 mL/min (ref >90)
Glucose, Bld: 100 mg/dL — ABNORMAL HIGH (ref 70–99)
Potassium: 4.1 mEq/L (ref 3.7–5.3)
Sodium: 126 mEq/L — ABNORMAL LOW (ref 137–147)

## 2014-06-15 LAB — CK: CK TOTAL: 6402 U/L — AB (ref 7–232)

## 2014-06-15 MED ORDER — VITAMIN B-1 100 MG PO TABS
100.0000 mg | ORAL_TABLET | Freq: Every day | ORAL | Status: DC
  Administered 2014-06-15 – 2014-06-17 (×3): 100 mg via ORAL
  Filled 2014-06-15 (×3): qty 1

## 2014-06-15 MED ORDER — DIAZEPAM 5 MG/ML IJ SOLN
5.0000 mg | INTRAMUSCULAR | Status: DC | PRN
  Administered 2014-06-15 – 2014-06-16 (×4): 5 mg via INTRAVENOUS
  Filled 2014-06-15 (×4): qty 2

## 2014-06-15 MED ORDER — SENNOSIDES-DOCUSATE SODIUM 8.6-50 MG PO TABS
2.0000 | ORAL_TABLET | Freq: Every day | ORAL | Status: DC
  Administered 2014-06-15 – 2014-06-17 (×3): 2 via ORAL
  Filled 2014-06-15 (×3): qty 2

## 2014-06-15 MED ORDER — FOLIC ACID 1 MG PO TABS
1.0000 mg | ORAL_TABLET | Freq: Every day | ORAL | Status: DC
  Administered 2014-06-15 – 2014-06-17 (×3): 1 mg via ORAL
  Filled 2014-06-15 (×3): qty 1

## 2014-06-15 NOTE — Progress Notes (Signed)
PCP notified RN. RN would continue to observe/monitor the patient.

## 2014-06-15 NOTE — Progress Notes (Signed)
[place] [facility] Dept Therapy Treatment Record Phone [phone]   Radiation Therapy was administered to Philip Andrews on: 06/15/2014  3:56 PM and was treatment # 2 out of a planned course of 5 treatments.

## 2014-06-15 NOTE — Progress Notes (Signed)
IP PROGRESS NOTE  Subjective:   Philip Andrews complains of pain in the right leg. The proximal right leg is painful at present. He has numbness in the right lower leg. He is not have adequate pain relief at present.  Objective: Vital signs in last 24 hours: Blood pressure 109/44, pulse 101, temperature 98.6 F (37 C), temperature source Oral, resp. rate 16, height 5\' 9"  (1.753 m), weight 128 lb 15.5 oz (58.5 kg), SpO2 92.00%.  Intake/Output from previous day: 07/06 0701 - 07/07 0700 In: 1937 [P.O.:600; I.V.:1100] Out: 625 [Urine:625]  Physical Exam: Cardiac: Regular rate and rhythm Lungs: Inspiratory rales at the right lower chest, no respiratory distress Abdomen: Soft and nontender Vascular: The right lower leg and foot are cold, diminished right DP pulse, the right thigh is warm with a good femoral pulse. Staples in place over the growing and right calf incisions. No evidence of bleeding.  Musculoskeletal: No tenderness at the left anterior chest  Lab Results:  Recent Labs  06/13/14 0244 06/15/14 0442  WBC 4.9 6.4  HGB 11.0* 11.6*  HCT 31.8* 33.2*  PLT 316 285    BMET  Recent Labs  06/12/14 0934 06/15/14 0442  NA  --  126*  K 4.1 4.1  CL  --  91*  CO2  --  22  GLUCOSE  --  100*  BUN  --  10  CREATININE  --  0.40*  CALCIUM  --  8.4    Medications: I have reviewed the patient's current medications.  Assessment/Plan:  1. Metastatic adenocarcinoma involving a pericardial effusion, TTF-1 and Napsin-A negative.  Status post pericardial window procedure and bronchoscopy on 10/31/2013. No tumor found at bronchoscopy and the pericardial tissue was negative for malignancy.  Staging PET scan 11/17/2013 with a hypermetabolic 1.6 cm left upper lobe suprahilar nodule with SUV max 9.1; medial to that nodule is a 6 mm nodule not showing any increased metabolic activity; multiple hypermetabolic small mediastinal and hilar lymph nodes bilaterally; additional nodes present in the  AP window, prevascular space and both hila; focally increased metabolic activity within the soft tissues of the right back with SUV max 6.7.  Status post palliative radiation to a soft tissue metastasis at the right back 11/26/2013 through 12/15/2013.  Restaging CT chest and abdomen 02/18/2014. Interval enlargement of the left upper lobe pulmonary nodule; enlargement of pericardial lesions overlying the heart; enlargement of bilateral hilar and mediastinal lymph nodes; ill-defined lesion in the liver not identified on prior studies. Previously suspected soft tissue lesion in the paraspinous muscular posterior to the right kidney not confidently identified on current study.  Cycle 1 Taxol/carboplatin 03/04/2014.  Cycle 2 Taxol/carboplatin 03/25/2014. Restaging CT 05/12/2014 with mild progression of the chest lymphadenopathy 2. Respiratory failure/cardiac tamponade secondary to #1. Resolved. 3. History of polysubstance abuse. 4. History of Acute renal failure. Resolved. 5. Mild normocytic anemia. 6. Left hilar mass on the noncontrast chest CT 10/24/2013. 7. Right low back/flank pain. Likely secondary to a soft tissue metastasis at the right back. He completed palliative radiation 11/26/2013 through 12/15/2013. Resolved.  8. Repeat hospital admission with left anterior chest pain-this appears to be related to a mass at the left anterior chest wall/pericardial  Initiation of palliative radiation 06/07/2014 9. Parenchymal infiltrates in the lower lungs-potentially nodular tumor spread 10. Acute Rt. Foot pain 06/10/16, s/p rt. Femoral embolectomy 06/10/14  Recurrent SFA and popliteal occlusion 06/11/14,s/p thromboembolectomy procedures to Rt. Leg vasculature 06/11/14  Maintained on therapeutic Lovenox  Loss of right leg  pulses 06/13/2014   Philip Andrews has metastatic carcinoma, likely of lung origin. He is now symptomatic with pain secondary to an ischemic right leg. 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 He also has left anterior chest pain and was undergoing palliative radiation prior to developing the ischemic right leg. His overall prognosis is poor. He is a candidate for Hospice care. I recommend an evaluation for [facility]. Continuing chest radiation is likely not indicated given his pain/narcotic requirement for the ischemic right leg.   Recommendations: 1. narcotic analgesics for pain, he will likely require a narcotic drip if the right leg ischemia progresses 2. [place] hospice referral To Consider [facility] 3. continue Lovenox 4. vascular surgery evaluation to consider palliative amputation if pain cannot be controlled with narcotics        LOS: 13 days   [place]  06/15/2014, 7:52 AM

## 2014-06-15 NOTE — Progress Notes (Signed)
Patient was getting out of bed and fell on the floor. The patient's vitals were: 97.44F,HR 117, RR 20, 140/78,92% on [place]. The patient c/o some pain in the right leg. The PCP on call was notified.  Awaiting any new orders.

## 2014-06-15 NOTE — Progress Notes (Addendum)
Patient ID: Philip Andrews, male   DOB: January 12, 1956, 58 y.o.   MRN: [id number]  [facility] PROGRESS NOTE  Philip Andrews QHU:[id number] DOB: 1956/02/05 DOA: 06/02/2014 PCP: No PCP Per Patient  Brief Narrative:   58 y.o. male with a PMH of metastatic adenocarcinoma of probable lung primary with metastasis to the pericardium, status post 2 cycles of Taxol/carboplatin who was admitted to [facility] 06/02/14 with a chief complaint of chest pain, which was felt to be due to pericardial metastasis. On 06/10/14, the patient developed acute onset of pain in his right second, third, and fourth toes. A duplex scan was done which showed clot in the deep femoral artery on the right as well as the mid femoral artery. He was transferred to [facility] and underwent a right femoral embolectomy on the same day. The patient was cleared by vascular surgery to return to [facility] to resume XRT on 06/11/14. After he returned to [place], he developed worsening pain and a CT angiogram demonstrated SFA and popliteal occlusion requiring an urgent right thrombectomy (transferred back to [facility] for this). Developed signs of reocclusion on 06/13/14, but no further embolectomy or amputation recommended by vascular surgery given his overall poor prognosis. He is now back at [facility].   Assessment/Plan:   Principal Problem:  Lung cancer Metastatic primary lung cancer with chest pain /cancer of anterior mediastinum  Followed by Dr. Benay Spice, completed 2 cycles of chemotherapy and declined further treatment.  Restaging CT of the chest 05/12/14 revealed mild progression of disease.  Dr. Benay Spice following the patient and initially recommended hospice care.  Seen by Dr. Lisbeth Renshaw of radiation oncology 06/04/14 with initiation of his first radiation treatment on 06/07/14, 30 gray in 10 fractions planned, which was resumed 7/6 and will be completed 06/18/14.  Palliative care consult requested  Active Problems:  Acute respiratory failure with hypoxia /suspected community-acquired  pneumonia  Initially felt to be secondary to community-acquired pneumonia, with initial chest radiograph showing increasing bilateral perihilar mass versus infiltration with a suggestion of focal developing infiltration in the left lung base. Infiltrates felt to represent spread of tumor by Dr. Benay Spice.  Treated with Rocephin/azithromycin x7 days as well as supplemental oxygen support. Protein-calorie malnutrition, severe / Underweight  Pt meets criteria for severe MALNUTRITION in the context of chronic illness as evidenced by PO intake <75% for one month, severe muscle wasting and subcutaneous fat loss, 7.4% body weight loss in one month.  Seen by dietitian 06/03/14. Continue nutritional supplements. Ischemia of toe/Embolism, arterial, leg, right / acquired thrombophilia secondary malignancy  On 06/10/14, the patient developed acute onset of pain in his right second, third, and fourth toes. A duplex scan was done which showed clot in the deep femoral artery on the right as well as the mid femoral artery.  Transferred to [facility] and underwent a right femoral embolectomy 06/10/14.  Underwent further embolectomy of multiple vessels along with a 4 compartment fasciotomy 06/12/14 for recurrent symptoms.  Initially treated with heparin postoperatively, now on treatment dose Lovenox.  Developed symptoms of reocclusion 06/23/14 but is not a candidate for further surgical intervention for vascular surgery notes. Comfort care/palliation recommended. CK elevated and could be contributing to LE pain. CK elevated at 6402. Hyponatremia  Mild. 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 DVT Prophylaxis  Continue Lovenox.  Code Status: DNR Family Communication: updated the pt at the bedside  Disposition Plan: Home with hospice vs residential hospice when stable.   IV Access:   Peripheral IV Procedures:   ABIs 06/10/14:  Right 1.02, left 1.05.  Arterial duplex 06/10/14: Positive for clot in the deep femoral artery and superficial  femoral artery on the right.  Right femoral embolectomy, intraoperative arteriogram, vein patch angioplasty of the right common femoral artery 06/10/14 by Dr. Scot Dock.  Right superficial and profunda femoral artery thromboembolectomy, right popliteal thromboembolectomy, right anterior tibial artery and posterior tibial artery thromboembolectomy, 4 compartment fasciotomy by Dr. Bridgett Larsson 06/12/14. Medical Consultants:   Dr. Julieanne Manson, Oncology.  Dr. Marye Round, XRT.  Dr. Deitra Mayo and Dr. Adele Barthel, Vascular Surgery. Other Consultants:   Physical therapy: SNF recommended.  Occupational therapy: No OT followup recommended.  Dietitian Anti-Infectives:   Azithromycin 06/02/14---> 06/10/14  Rocephin 06/03/14---> 06/10/14  Levaquin 06/10/14---> 06/12/14  Cefuroxime 06/10/14---> 06/12/14  Leisa Lenz, MD  [facility] Pager [phone]  If 7PM-7AM, please contact night-coverage [email or web address] Password [email or web address] 06/15/2014, 12:04 PM   LOS: 13 days   HPI/Subjective: No acute overnight events. Complains of pain this am.   Objective: Filed Vitals:   06/14/14 1428 06/14/14 2152 06/15/14 0150 06/15/14 0557  BP: 102/71 109/70 140/78 109/44  Pulse: 129 117 117 101  Temp: 98.2 F (36.8 C) 97.8 F (36.6 C) 97.4 F (36.3 C) 98.6 F (37 C)  TempSrc: Oral Oral  Oral  Resp: 20 20 20 16   Height:      Weight:      SpO2: 95% 90% 92% 92%    Intake/Output Summary (Last 24 hours) at 06/15/14 1204 Last data filed at 06/15/14 0911  Gross per 24 hour  Intake   1817 ml  Output    400 ml  Net   1417 ml    Exam:   General:  Pt is alert, follows commands appropriately, not in acute distress  Cardiovascular: Regular rate and rhythm, S1/S2, no murmurs  Respiratory: Clear to auscultation bilaterally, no wheezing, no crackles, no rhonchi  Abdomen: Soft, non tender, non distended, bowel sounds present  Data Reviewed: Basic Metabolic Panel:  Recent Labs Lab 06/10/14 0438 06/10/14 1925  06/11/14 0319 06/12/14 0250 06/12/14 0934 06/15/14 0442  NA 132*  --  131* 134*  --  126*  K 4.0  --  4.0 5.6* 4.1 4.1  CL 93*  --  93* 95*  --  91*  CO2 30  --  24 26  --  22  GLUCOSE 110*  --  101* 138*  --  100*  BUN 9  --  9 8  --  10  CREATININE 0.64 0.54 0.58 0.58  --  0.40*  CALCIUM 9.1  --  8.8 9.1  --  8.4   Liver Function Tests:  Recent Labs Lab 06/11/14 0319 06/12/14 0250  AST 14 19  ALT 7 8  ALKPHOS 75 69  BILITOT 0.3 0.2*  PROT 6.8 6.6  ALBUMIN 2.6* 2.7*   CBC:  Recent Labs Lab 06/10/14 1925 06/11/14 0319 06/12/14 0250 06/13/14 0244 06/15/14 0442  WBC 5.9 5.8 7.0 4.9 6.4  HGB 13.2 12.8* 11.6* 11.0* 11.6*  HCT 38.1* 36.8* 34.0* 31.8* 33.2*  MCV 91.6 91.5 91.2 90.3 89.7  PLT 316 324 271 316 285   Cardiac Enzymes:  Recent Labs Lab 06/15/14 0442  CKTOTAL 6402*   Studies: No results found.  Scheduled Meds: . ALPRAZolam  1 mg Oral QHS  . docusate sodium  100 mg Oral BID  . enoxaparin (LOVENOX) injection  60 mg Subcutaneous Q12H  . feeding supplement (ENSURE COMPLETE)  237 mL Oral TID BM  . folic acid  1 mg Oral Daily  . OxyCODONE  40 mg Oral Q12H  . pantoprazole  40 mg Oral Daily  . potassium chloride  20-40 mEq Oral Once  . senna-docusate  2 tablet Oral Daily  . thiamine  100 mg Oral Daily   Continuous Infusions: . sodium chloride 100 mL/hr at 06/15/14 0315

## 2014-06-15 NOTE — Clinical Social Work Note (Signed)
RN advised CSW that MD has ordered  CSW for residential hospice placement. I have spoken with patient and he is open to talking with [facility] place rep- he has some questions regarding this option and I have spoken with Erling Conte who will follow up and advise-  Full assessment to follow-    Eduard Clos, MSW, [place]

## 2014-06-15 NOTE — Progress Notes (Signed)
Attempted visit. Pt was asleep. Will return visit at a later time. Ernest Haber Chaplain  06/15/14 1200  Clinical Encounter Type  Visited With Other (Comment)

## 2014-06-15 NOTE — Progress Notes (Signed)
RN notified the patient's son about the patient slipping onto the floor. Will keep monitoring the patient.

## 2014-06-15 NOTE — Progress Notes (Signed)
Telemetry has been discontinued.

## 2014-06-15 NOTE — Progress Notes (Signed)
Patient has a wallet/money but has refused for the staff to take the wallet to security.

## 2014-06-15 NOTE — Consult Note (Addendum)
Palliative Medicine Team at [facility]  Date: 06/15/2014   Patient Name: Philip Andrews  DOB: January 07, 1956  MRN: [id number]  Age / Sex: 58 y.o., male   PCP: No Pcp Per Patient Referring Physician: Venetia Maxon Rama, MD  Active Problems: Principal Problem:   Lung cancer Active Problems:   Acute respiratory failure with hypoxia   Chest pain   Metastatic primary lung cancer   Protein-calorie malnutrition, severe   Cancer of anterior mediastinum   Ischemia of toe   Embolism, arterial, leg, right   Underweight   Hyponatremia   CAP (community acquired pneumonia)   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 His treatment plan thus far and medical care has been significantly affected by complex psycho/social issues and inadequate financial and emotional resources. PMT consulted for full goals of care discussion.  Participants in Discussion: Patient only- he does not want his condition discussed with other people. When I asked him who supported him and helped him or if he had family locally he adamantly said no "I have no one who I trust or who can help me- I have people but I mostly just do for myself"   Advance Directive: none   Code Status Orders        Start     Ordered   06/14/14 2216  Do not attempt resuscitation (DNR)   Continuous     06/14/14 2216     I have reviewed the medical record, interviewed the patient and family, and examined the patient. The following aspects are pertinent.  Past Medical History  Diagnosis Date  . Substance abuse     per H&P  . Lung nodules   . Lung nodules   . Gastric ulcer   . History of radiation therapy 11/26/13-12/15/13    30 gray to right lower posterior chest wall mass  . Metastatic adenocarcinoma 02/23/2014   History   Social History  . Marital Status: Single    Spouse Name:  N/A    Number of Children: 2  . Years of Education: N/A   Social History Main Topics  . Smoking status: Current Every Day Smoker -- 0.04 packs/day for 44 years    Types: Cigarettes  . Smokeless tobacco: None  . Alcohol Use: 1.2 oz/week    2 Cans of beer per week  . Drug Use: No  . Sexual Activity: None   Other Topics Concern  . None   Social History Narrative  . None   History reviewed. No pertinent family history. Scheduled Meds: . ALPRAZolam  1 mg Oral QHS  . docusate sodium  100 mg Oral BID  . enoxaparin (LOVENOX) injection  60 mg Subcutaneous Q12H  . feeding supplement (ENSURE COMPLETE)  237 mL Oral TID BM  . OxyCODONE  40 mg Oral Q12H  . pantoprazole  40 mg Oral Daily  . potassium chloride  20-40 mEq Oral Once   Continuous Infusions: . sodium chloride 100 mL/hr at 06/14/14 1810   PRN Meds:.acetaminophen, acetaminophen, alum & mag hydroxide-simeth, bisacodyl, guaiFENesin-dextromethorphan, hydrALAZINE, HYDROmorphone (DILAUDID) injection, labetalol, metoprolol, ondansetron, oxyCODONE, phenol Allergies  Allergen Reactions  . Benadryl [Diphenhydramine Hcl]     Pt states he just knows he's allergic   CBC:    Component Value Date/Time   WBC 4.9 06/13/2014 0244   WBC 3.6* 05/12/2014 1341   HGB 11.0*  06/13/2014 0244   HGB 14.4 05/12/2014 1341   HCT 31.8* 06/13/2014 0244   HCT 41.6 05/12/2014 1341   PLT 316 06/13/2014 0244   PLT 241 05/12/2014 1341   MCV 90.3 06/13/2014 0244   MCV 94.8 05/12/2014 1341   NEUTROABS 2.0 05/12/2014 1341   NEUTROABS 11.7* 10/24/2013 1627   LYMPHSABS 1.0 05/12/2014 1341   LYMPHSABS 1.5 10/24/2013 1627   MONOABS 0.5 05/12/2014 1341   MONOABS 0.6 10/24/2013 1627   EOSABS 0.1 05/12/2014 1341   EOSABS 0.0 10/24/2013 1627   BASOSABS 0.0 05/12/2014 1341   BASOSABS 0.0 10/24/2013 1627   Comprehensive Metabolic Panel:    Component Value Date/Time   NA 134* 06/12/2014 0250   NA 138 05/12/2014 1342   K 4.1 06/12/2014 0934   K 3.7 05/12/2014 1342   CL 95* 06/12/2014 0250    CO2 26 06/12/2014 0250   CO2 22 05/12/2014 1342   BUN 8 06/12/2014 0250   BUN 10.8 05/12/2014 1342   CREATININE 0.58 06/12/2014 0250   CREATININE 0.8 05/12/2014 1342   GLUCOSE 138* 06/12/2014 0250   GLUCOSE 94 05/12/2014 1342   CALCIUM 9.1 06/12/2014 0250   CALCIUM 9.2 05/12/2014 1342   AST 19 06/12/2014 0250   AST 25 05/12/2014 1342   ALT 8 06/12/2014 0250   ALT 10 05/12/2014 1342   ALKPHOS 69 06/12/2014 0250   ALKPHOS 101 05/12/2014 1342   BILITOT 0.2* 06/12/2014 0250   BILITOT 0.58 05/12/2014 1342   PROT 6.6 06/12/2014 0250   PROT 7.6 05/12/2014 1342   ALBUMIN 2.7* 06/12/2014 0250   ALBUMIN 3.5 05/12/2014 1342    Vital Signs: BP 109/70  Pulse 117  Temp(Src) 97.8 F (36.6 C) (Oral)  Resp 20  Ht 5\' 9"  (1.753 m)  Wt 58.5 kg (128 lb 15.5 oz)  BMI 19.04 kg/m2  SpO2 90% Filed Weights   06/11/14 1524 06/12/14 0200 06/13/14 2109  Weight: 62.4 kg (137 lb 9.1 oz) 60.9 kg (134 lb 4.2 oz) 58.5 kg (128 lb 15.5 oz)   07/06 0701 - 07/07 0700 In: 1937 [P.O.:600; I.V.:1100] Out: 350 [Urine:350]  Physical Exam:  General Appearance: Alert, oriented,mild distress-malnourished with temporal wasting HEENT: poor dentition Lungs: coarse sounds heard CV: tachycardiaregular rate and rhythm Abdomen:soft, non-tender; bowel sounds normal; no masses,  no organomegaly Extremities: His right lower leg is draining significant amount  Of serosanguinous blood, the skin is tense Psych: talkative, appears reliable  Summary of Established Goals of Care and Medical Treatment Preferences   Primary Diagnoses  1. Metastatic Lung Cancer, planned radiation for possible pericardial mets he was admitted with severe chest pain. 2. Arterial Embolism right leg  Active Symptoms: 1. Severe Pain in chest and in his post/op leg  Psycho-social/Spiritual:  Mr. Winemiller has a very difficult social situation that is not fully captured in review of his documentation, he largely has been off the medical radar for the past two months due to lost to follow  up/no return calls. Mr. Auker tells me today that he is and has been essentially homeless for the past few weeks. Mr. Rao has spent the majority of his adult and young adult life in prison (in prison on and off since 1975), he tells me he has never "had his own place", he has never paid a bill or received important mail in his entire life. He says he is a survivor and has lived a players life and that in the past he would take anything or stick up  anyone to survive. Public records indicate that he has over 10 DWI charges and a permanently revoked drivers license. He says his life is different now and that he is a man of god and that had been living with someone he called his "aunt" and older lady who was 57yo but he actually wasn't related to her-she moved away he says to [place] about a week ago. He has been moving around to different friends houses- he gets "488 dollars a month to live on".  Prognosis: <6 months   Palliative Performance Scale: 40  Recommendations:  1. Code Status: DNR, confirmed 2. Scope of Treatment:    Comfort and QOL, he says most days the pain is so bad that death would be better  He wants some dignity and independence- really wants his own place before he dies  Desires aggressive pain control 3. Symptom Management:   He has received a total of 25mg  of IV dilaudid in teh past 24 hours- this is almost 500mg  oral morphine equivalents.  Started him on Oxycontin 40 BID  Alprazolam 1mg  PO QHS for sleep  IV Valium for agitation or distress  Prior history of severe ETOH abuse-unclear if still drinking heavily or not- may start having symptoms of withdrawal- will also start thiamine.  Given his stated goals and DNR status I will discontinue his telemetry  Palliative Prophylaxis: Will start bowel regimen 4. Disposition: Will need CSW assistance- I discussed Hospice Facility vs. SNF with Hospice since he has medicaid.   Time In: 9:30PM Time Out: 1040PM Time Total:  70 minutes Greater than 50%  of this time was spent counseling and coordinating care related to the above assessment and plan.  Signed by: Roma Schanz, DO  06/15/2014, 1:32 AM  Please contact Palliative Medicine Team phone at [phone] for questions and concerns.

## 2014-06-16 ENCOUNTER — Ambulatory Visit
Admit: 2014-06-16 | Discharge: 2014-06-16 | Disposition: A | Discharge status: Home / Self Care | Payer: Medicaid Other | Attending: Radiation Oncology | Admitting: Radiation Oncology

## 2014-06-16 ENCOUNTER — Encounter: Payer: Self-pay | Admitting: Oncology

## 2014-06-16 LAB — BASIC METABOLIC PANEL
Anion gap: 10 (ref 5–15)
BUN: 10 mg/dL (ref 6–23)
CO2: 25 meq/L (ref 19–32)
Calcium: 8.3 mg/dL — ABNORMAL LOW (ref 8.4–10.5)
Chloride: 90 mEq/L — ABNORMAL LOW (ref 96–112)
Creatinine, Ser: 0.5 mg/dL (ref 0.50–1.35)
GFR calc Af Amer: >90 mL/min (ref >90)
GLUCOSE: 108 mg/dL — AB (ref 70–99)
POTASSIUM: 4 meq/L (ref 3.7–5.3)
SODIUM: 125 meq/L — AB (ref 137–147)

## 2014-06-16 LAB — CBC
HEMATOCRIT: 32.2 % — AB (ref 39.0–52.0)
Hemoglobin: 11.6 g/dL — ABNORMAL LOW (ref 13.0–17.0)
MCH: 31.8 pg (ref 26.0–34.0)
MCHC: 36 g/dL (ref 30.0–36.0)
MCV: 88.2 fL (ref 78.0–100.0)
Platelets: 278 10*3/uL (ref 150–400)
RBC: 3.65 MIL/uL — ABNORMAL LOW (ref 4.22–5.81)
RDW: 11.3 % — ABNORMAL LOW (ref 11.5–15.5)
WBC: 6.9 10*3/uL (ref 4.0–10.5)

## 2014-06-16 LAB — CK: Total CK: 5971 U/L — ABNORMAL HIGH (ref 7–232)

## 2014-06-16 NOTE — Progress Notes (Signed)
Patient sitting on side of bed at time of visit. Offered support. He said he was doing "fine" at the moment and denied needing support. Will continue to follow as needed.  Epifania Gore, PhD, [facility] Chaplain [phone]

## 2014-06-16 NOTE — Progress Notes (Signed)
[facility] Therapy Treatment Record Phone [phone]   Radiation Therapy was administered to Philip Andrews on: 06/16/2014  3:27 PM and was treatment # 6 out of a planned course of 8 treatments.

## 2014-06-16 NOTE — Progress Notes (Addendum)
Clinical Social Work Department BRIEF PSYCHOSOCIAL ASSESSMENT 06/16/2014  Patient:  Philip Andrews, Philip Andrews     Account Number:  [id number]     Admit date:  06/02/2014  Clinical Social Worker:  Philip Andrews  Date/Time:  06/16/2014 12:00 N  Referred by:  Physician  Date Referred:  06/16/2014 Referred for  Residential hospice placement   Other Referral:   Interview type:  Family Other interview type:    PSYCHOSOCIAL DATA Living Status:  ALONE Admitted from facility:   Level of care:   Primary support name:  Philip Andrews Primary support relationship to patient:  CHILD, ADULT Degree of support available:   adequate    CURRENT CONCERNS Current Concerns  Post-Acute Placement   Other Concerns:    SOCIAL WORK ASSESSMENT / PLAN CSW received handoff regarding referral for residential hospice placement.    CSW reviewed case with Philip Andrews, Philip Andrews who received referral yesterday and was able to meet with pt and clarify pt questions regarding [employer]. [name], Philip Andrews discussed with this CSW that pt expressed yesterday that he had three more radiation treatments. Per Dr. Benay Andrews note on 06/15/2014, recommendation for [facility] to evaluate and continuing chest radiation is likely not indicated given his pain/narcotic requirement for the ischemic right leg.    Per [facility], Philip Andrews, pt is eligible for [facility] and bed available tomorrow as long as pt will not be continuing radiation.    CSW contacted attending MD to update and attending MD planned to contact Dr. Benay Andrews to discuss.    CSW received notification from attending MD that attending MD was able to contact Dr. Benay Andrews regarding plans for [facility]. Per attending MD, Dr. Benay Andrews feels that transfer to [facility] is appropriate and radiation following pt discharge to [facility] is not indicated, but pt can have radiation while in the hospital.    CSW and attending  MD attempted to meet with pt at bedside to discuss. Pt very sleepy and unable to participate in discussion at this time. Per RN, pt had just received medication, but was awake before receiving medication.    CSW contacted pt son, Philip Andrews via telephone [phone]). CSW introduced self and explained role. CSW discussed with pt son Dr. Benay Andrews recommendation for [facility] and bed availability tomorrow. CSW discussed about radiation continuing while pt in hospital, but per Dr. Benay Andrews will not be indicated once pt transfers to [facility]. Pt son expressed understanding and discussed that pt had discussed with pt son about transitioning to [facility]. Pt son agreeable with assisting with paperwork for [facility] and agreeable to [facility] contacting pt son to schedule time for paperwork.    CSW updated [employer], Philip Andrews.    CSW spoke with radiation oncology and was able to arrange for pt radiation treatment to be scheduled at 8:45 tomorrow morning in order for pt to receive radiation prior to pt transitioning to [facility].    CSW to follow up with pt when pt more awake to discuss and ensure that pt agreeable.    CSW to continue to follow.   Assessment/plan status:  Psychosocial Support/Ongoing Assessment of Needs Other assessment/ plan:   discharge planning   Information/referral to community resources:   [employer]    PATIENT'S/FAMILY'S RESPONSE TO PLAN OF CARE: Per chart, pt alert and oriented x 4. Pt drowsy at this time and unable to discuss disposition planning. Pt son discussed that pt had  shared with pt son about [employer] and pt son agreeable to assisting as needed. Will follow up with pt re: [facility].    Alison Murray, MSW, [place] [employer] [phone]

## 2014-06-16 NOTE — Progress Notes (Signed)
ANTICOAGULATION CONSULT NOTE - Follow Up Consult  Pharmacy Consult for Lovenox Indication: DVT  Allergies  Allergen Reactions  . Benadryl [Diphenhydramine Hcl]     Pt states he just knows he's allergic    Patient Measurements: Height: 5\' 9"  (175.3 cm) Weight: 128 lb 15.5 oz (58.5 kg) IBW/kg (Calculated) : 70.7  Vital Signs: Temp: 98.9 F (37.2 C) (07/08 0436) Temp src: Oral (07/08 0436) BP: 110/58 mmHg (07/08 0436) Pulse Rate: 117 (07/08 0436)  Labs:  Recent Labs  06/15/14 0442 06/16/14 0428  HGB 11.6* 11.6*  HCT 33.2* 32.2*  PLT 285 278  CREATININE 0.40* 0.50  CKTOTAL 6402* 5971*    Estimated Creatinine Clearance: 82.3 ml/min (by C-G formula based on Cr of 0.5).   Medications:  Scheduled:  . ALPRAZolam  1 mg Oral QHS  . docusate sodium  100 mg Oral BID  . enoxaparin (LOVENOX) injection  60 mg Subcutaneous Q12H  . feeding supplement (ENSURE COMPLETE)  237 mL Oral TID BM  . folic acid  1 mg Oral Daily  . OxyCODONE  40 mg Oral Q12H  . pantoprazole  40 mg Oral Daily  . potassium chloride  20-40 mEq Oral Once  . senna-docusate  2 tablet Oral Daily  . thiamine  100 mg Oral Daily   Infusions:  . sodium chloride 50 mL/hr at 06/15/14 1217    Assessment: 58 yo male admitted 6/25 to [facility] for CP found to be associated w/ metastatic adenocarcinoma of lung, tx'd to [facility] for urgent embolectomy on 7/2 after duplex revealed clot in deep femoral artery.  Developed re-occlusion and required another thrombectomy on 7/4.  Post-op he was on IV Heparin which was changed to therapeutic Lovenox per pharmacy dosing on 7/5.   SCr 0.5, CrCl ~ 82 ml/min  CBC:  Hgb 11.6 and Plt 278.  Stable.   Goal of Therapy:  Anti-Xa level 0.6-1 units/ml 4hrs after LMWH dose given Monitor platelets by anticoagulation protocol: Yes   Plan:   Continue Lovenox 60mg  SQ Q12 hours.  Follow up renal function and CBC at least q72 hours while inpatient.  Gretta Arab PharmD, BCPS Pager  [phone] 06/16/2014 8:23 AM

## 2014-06-16 NOTE — Consult Note (Signed)
[place] Liaison: (late entry from 1400 06/15/2014) Received request from [place] to follow up with patient for interest in [facility]. Chart reviewed and spoke with Philip Andrews to answer questions and offer information. Visit interrupted due to patient going for radiation. He informed me he has "three more after today" and may be interested in [facility] at that time. He is pleased rooms are private with outdoor access. He expressed confidence in Dr. Benay Spice and the way he "tells me the truth." Patient requested I contact his son at number provided, which I was not successful at day of this visit but will try continue to try today. Bing Ree MD reviewing clinical information. Will continue to follow, update patient and staff re availability. Thank you. Philip Conte LCSW [phone]

## 2014-06-16 NOTE — Progress Notes (Signed)
PT Cancellation Note  Patient Details Name: Philip Andrews MRN: [id number] DOB: [birth year]   Cancelled Treatment:    Reason Eval/Treat Not Completed: Medical issues which prohibited therapy . Chart reviewed. Noted [place] is evaluating for posiible admission. Also noted that pt's LE pain is high, s/p thromboembolectomy and 4 compartment faciotomy. PT may not be indicated at this time. Will F/U tomorrow.   Claretha Cooper 06/16/2014, 12:44 PM Tresa Endo PT [phone]

## 2014-06-16 NOTE — Progress Notes (Addendum)
Patient ID: Philip Andrews, male   DOB: July 09, 1956, 58 y.o.   MRN: [id number] [facility] PROGRESS NOTE  Philip Andrews VCB:[id number] DOB: 1956/08/29 DOA: 06/02/2014 PCP: No PCP Per Patient  Brief narrative: 58 y.o. male with a PMH of metastatic adenocarcinoma of probable lung primary with metastasis to the pericardium, status post 2 cycles of Taxol/carboplatin who was admitted to [facility] 06/02/14 with a chief complaint of chest pain, which was felt to be due to pericardial metastasis. On 06/10/14, the patient developed acute onset of pain in his right second, third, and fourth toes. A duplex scan was done which showed clot in the deep femoral artery on the right as well as the mid femoral artery. He was transferred to [facility] and underwent a right femoral embolectomy on the same day. The patient was cleared by vascular surgery to return to [facility] to resume XRT on 06/11/14. After he returned to [facility], he developed worsening pain and a CT angiogram demonstrated SFA and popliteal occlusion requiring an urgent right thrombectomy (transferred back to [facility] for this). Developed signs of reocclusion on 06/13/14, but no further embolectomy or amputation recommended by vascular surgery given his overall poor prognosis.   Assessment and Plan:  Principal Problem:  Lung cancer Metastatic primary lung cancer with chest pain /cancer of anterior mediastinum  Followed by Dr. Benay Spice, completed 2 cycles of chemotherapy and declined further treatment.  Restaging CT of the chest 05/12/14 revealed mild progression of disease.  Per oncology recommendation for residential hospice; SW assisting D/C plan Seen by Dr. Lisbeth Renshaw of radiation oncology 06/04/14 with initiation of his first radiation treatment on 06/07/14, 30 gray in 10 fractions planned, which was resumed 7/6 and will be completed 06/18/14 but if plan for residential hospice than RT can be stopped per DR. Sherrill.  Active Problems:  Acute respiratory failure with hypoxia /suspected  community-acquired pneumonia  Initially felt to be secondary to community-acquired pneumonia, with initial chest radiograph showing increasing bilateral perihilar mass versus infiltration with a suggestion of focal developing infiltration in the left lung base. Infiltrates felt to represent spread of tumor by Dr. Benay Spice.  Treated with Rocephin/azithromycin x7 days as well as supplemental oxygen support. Protein-calorie malnutrition, severe / Underweight  Pt meets criteria for severe MALNUTRITION in the context of chronic illness as evidenced by PO intake <75% for one month, severe muscle wasting and subcutaneous fat loss, 7.4% body weight loss in one month.  Seen by dietitian 06/03/14. Continue nutritional supplements. Anemia of chronic disease  Secondary to history of malignancy  No current indications for transfusion  Ischemia of toe/Embolism, arterial, leg, right / acquired thrombophilia secondary malignancy  On 06/10/14, the patient developed acute onset of pain in his right second, third, and fourth toes. A duplex scan was done which showed clot in the deep femoral artery on the right as well as the mid femoral artery.  Transferred to [facility] and underwent a right femoral embolectomy 06/10/14.  Underwent further embolectomy of multiple vessels along with a 4 compartment fasciotomy 06/12/14 for recurrent symptoms.  Initially treated with heparin postoperatively, now on treatment dose Lovenox.  Developed symptoms of reocclusion 06/23/14 but is not a candidate for further surgical intervention for vascular surgery notes. Comfort care/palliation recommended.  CK elevated and could be contributing to LE pain. CK elevated at 6402. 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 DVT Prophylaxis  Continue Lovenox.   Code Status: DNR/DNI Family Communication: family not at the bedside Disposition Plan: Residential hospice when stable.  IV Access:   Peripheral IV Procedures:   ABIs 06/10/14: Right 1.02,  left 1.05.  Arterial duplex 06/10/14: Positive for clot in the deep femoral artery and superficial femoral artery on the right.  Right femoral embolectomy, intraoperative arteriogram, vein patch angioplasty of the right common femoral artery 06/10/14 by Dr. Scot Dock.  Right superficial and profunda femoral artery thromboembolectomy, right popliteal thromboembolectomy, right anterior tibial artery and posterior tibial artery thromboembolectomy, 4 compartment fasciotomy by Dr. Bridgett Larsson 06/12/14. Medical Consultants:   Dr. Julieanne Manson, Oncology.  Dr. Marye Round, XRT.  Dr. Deitra Mayo and Dr. Adele Barthel, Vascular Surgery. Other Consultants:   Physical therapy: SNF recommended.  Occupational therapy: No OT followup recommended.  Dietitian Anti-Infectives:   Azithromycin 06/02/14---> 06/10/14  Rocephin 06/03/14---> 06/10/14  Levaquin 06/10/14---> 06/12/14  Cefuroxime 06/10/14---> 06/12/14    Leisa Lenz, MD  [facility] Pager [phone]  If 7PM-7AM, please contact night-coverage [email or web address] Password [facility] 06/16/2014, 12:27 PM   LOS: 14 days    HPI/Subjective: No acute overnight events.  Objective: Filed Vitals:   06/15/14 0557 06/15/14 1444 06/15/14 2014 06/16/14 0436  BP: 109/44 102/72 129/77 110/58  Pulse: 101 119 111 117  Temp: 98.6 F (37 C) 98 F (36.7 C) 99.1 F (37.3 C) 98.9 F (37.2 C)  TempSrc: Oral Oral Oral Oral  Resp: 16 18 16 16   Height:      Weight:      SpO2: 92% 95% 93% 91%    Intake/Output Summary (Last 24 hours) at 06/16/14 1227 Last data filed at 06/16/14 0745  Gross per 24 hour  Intake 1484.17 ml  Output    250 ml  Net 1234.17 ml    Exam:  General: Pt is sleeping, no distress  Cardiovascular: Regular rate and rhythm, S1/S2, no murmurs  Respiratory: Clear to auscultation bilaterally, no wheezing Abdomen: Soft, non tender, non distended, bowel sounds present   Data Reviewed: Basic Metabolic Panel:  Recent Labs Lab 06/10/14 0438  06/10/14 1925 06/11/14 0319 06/12/14 0250 06/12/14 0934 06/15/14 0442 06/16/14 0428  NA 132*  --  131* 134*  --  126* 125*  K 4.0  --  4.0 5.6* 4.1 4.1 4.0  CL 93*  --  93* 95*  --  91* 90*  CO2 30  --  24 26  --  22 25  GLUCOSE 110*  --  101* 138*  --  100* 108*  BUN 9  --  9 8  --  10 10  CREATININE 0.64 0.54 0.58 0.58  --  0.40* 0.50  CALCIUM 9.1  --  8.8 9.1  --  8.4 8.3*   Liver Function Tests:  Recent Labs Lab 06/11/14 0319 06/12/14 0250  AST 14 19  ALT 7 8  ALKPHOS 75 69  BILITOT 0.3 0.2*  PROT 6.8 6.6  ALBUMIN 2.6* 2.7*   No results found for this basename: LIPASE, AMYLASE,  in the last 168 hours No results found for this basename: AMMONIA,  in the last 168 hours CBC:  Recent Labs Lab 06/11/14 0319 06/12/14 0250 06/13/14 0244 06/15/14 0442 06/16/14 0428  WBC 5.8 7.0 4.9 6.4 6.9  HGB 12.8* 11.6* 11.0* 11.6* 11.6*  HCT 36.8* 34.0* 31.8* 33.2* 32.2*  MCV 91.5 91.2 90.3 89.7 88.2  PLT 324 271 316 285 278   Cardiac Enzymes:  Recent Labs Lab 06/15/14 0442 06/16/14 0428  CKTOTAL 6402* 5971*   BNP: No components found with this basename: POCBNP,  CBG: No results found for this basename:  GLUCAP,  in the last 168 hours  No results found for this or any previous visit (from the past 240 hour(s)).   Studies: No results found.  Scheduled Meds: . ALPRAZolam  1 mg Oral QHS  . docusate sodium  100 mg Oral BID  . enoxaparin (LOVENOX) injection  60 mg Subcutaneous Q12H  . feeding supplement (ENSURE COMPLETE)  237 mL Oral TID BM  . folic acid  1 mg Oral Daily  . OxyCODONE  40 mg Oral Q12H  . pantoprazole  40 mg Oral Daily  . potassium chloride  20-40 mEq Oral Once  . senna-docusate  2 tablet Oral Daily  . thiamine  100 mg Oral Daily   Continuous Infusions: . sodium chloride 50 mL/hr at 06/15/14 1217

## 2014-06-16 NOTE — Progress Notes (Signed)
Faxed son's fmla to [phone]

## 2014-06-16 NOTE — Progress Notes (Signed)
CSW continuing to follow for pt transition to residential hospice.  CSW met with pt at bedside. Pt sitting on side of bed at this time. Pt more alert, but still a bit drowsy. CSW discussed with pt about Dr. Benay Spice recommendation for [facility] and facility will have bed available tomorrow. Pt expressed that he has confidence in Dr. Gearldine Shown recommendation and is agreeable to transition to [facility]. CSW shared with pt that CSW clarified with MD about pt radiation and MD was able to speak to Dr. Benay Spice who stated that it would be appropriate for pt to transfer to [facility] tomorrow following radiation and radiation on Friday would not be indicated. Pt nodded his head in understanding. CSW inquired with pt if he wanted [facility], Erling Conte to return to clarify any more questions today and pt stated that he did not feel this was necessary. Pt quiet during conversation, but answered questions appropriately and asked appropriate questions.  CSW to continue to follow and assist with pt transition to [facility].  Alison Murray, MSW, [place] Work [phone]

## 2014-06-17 ENCOUNTER — Ambulatory Visit
Admit: 2014-06-17 | Discharge: 2014-06-17 | Disposition: A | Discharge status: Home / Self Care | Payer: Medicaid Other | Attending: Radiation Oncology | Admitting: Radiation Oncology

## 2014-06-17 LAB — CBC
HEMATOCRIT: 29.7 % — AB (ref 39.0–52.0)
HEMOGLOBIN: 10.4 g/dL — AB (ref 13.0–17.0)
MCH: 31.5 pg (ref 26.0–34.0)
MCHC: 35 g/dL (ref 30.0–36.0)
MCV: 90 fL (ref 78.0–100.0)
Platelets: 248 10*3/uL (ref 150–400)
RBC: 3.3 MIL/uL — AB (ref 4.22–5.81)
RDW: 11.6 % (ref 11.5–15.5)
WBC: 7.4 10*3/uL (ref 4.0–10.5)

## 2014-06-17 MED ORDER — PANTOPRAZOLE SODIUM 40 MG PO TBEC: 40.0000 mg | DELAYED_RELEASE_TABLET | Freq: Every day | ORAL | Status: AC

## 2014-06-17 MED ORDER — ALPRAZOLAM 1 MG PO TABS: 1.0000 mg | ORAL_TABLET | Freq: Every day | ORAL | Status: AC

## 2014-06-17 MED ORDER — ENOXAPARIN SODIUM 60 MG/0.6ML ~~LOC~~ SOLN: 60.0000 mg | Freq: Two times a day (BID) | SUBCUTANEOUS | Status: AC

## 2014-06-17 MED ORDER — DIAZEPAM 5 MG/ML IJ SOLN: 5.0000 mg | INTRAMUSCULAR | Status: AC | PRN

## 2014-06-17 MED ORDER — ACETAMINOPHEN 325 MG PO TABS: 325.0000 mg | ORAL_TABLET | ORAL | Status: AC | PRN

## 2014-06-17 MED ORDER — ENSURE COMPLETE PO LIQD: 237.0000 mL | Freq: Three times a day (TID) | ORAL | Status: AC

## 2014-06-17 MED ORDER — OXYCODONE HCL ER 40 MG PO T12A: 40.0000 mg | EXTENDED_RELEASE_TABLET | Freq: Two times a day (BID) | ORAL | Status: AC

## 2014-06-17 MED ORDER — PHENOL 1.4 % MT LIQD: 1.0000 | OROMUCOSAL | Status: AC | PRN

## 2014-06-17 MED ORDER — FOLIC ACID 1 MG PO TABS: 1.0000 mg | ORAL_TABLET | Freq: Every day | ORAL | Status: AC

## 2014-06-17 MED ORDER — THIAMINE HCL 100 MG PO TABS: 100.0000 mg | ORAL_TABLET | Freq: Every day | ORAL | Status: AC

## 2014-06-17 MED ORDER — ALUM & MAG HYDROXIDE-SIMETH 200-200-20 MG/5ML PO SUSP: 15.0000 mL | ORAL | Status: AC | PRN

## 2014-06-17 MED ORDER — OXYCODONE HCL 20 MG/ML PO CONC: 5.0000 mg | ORAL | Status: AC | PRN

## 2014-06-17 MED ORDER — DSS 100 MG PO CAPS: 100.0000 mg | ORAL_CAPSULE | Freq: Two times a day (BID) | ORAL | Status: AC

## 2014-06-17 MED ORDER — OXYCODONE HCL 20 MG/ML PO CONC
5.0000 mg | ORAL | Status: DC | PRN
  Administered 2014-06-17: 5 mg via ORAL
  Filled 2014-06-17: qty 1

## 2014-06-17 MED ORDER — GUAIFENESIN-DM 100-10 MG/5ML PO SYRP: 5.0000 mL | ORAL_SOLUTION | ORAL | Status: AC | PRN

## 2014-06-17 MED ORDER — HYDROMORPHONE HCL PF 1 MG/ML IJ SOLN: 1.0000 mg | INTRAMUSCULAR | Status: AC | PRN

## 2014-06-17 NOTE — Progress Notes (Signed)
On my first attempt, pt was w/NT. Second attempt to visit, pt was sitting on side of the bed eating. I told him I would return. He asked me to come back anytime. Third attempt to visit, pt was discharged. Ernest Haber Chaplain  06/17/14 1100  Clinical Encounter Type  Visited With Patient

## 2014-06-17 NOTE — Discharge Summary (Signed)
Physician Discharge Summary  Philip Andrews XBJ:[id number] DOB: 02/12/1956 DOA: 06/02/2014  PCP: No PCP Per Patient  Admit date: 06/02/2014 Discharge date: 06/17/2014  Recommendations for Outpatient Follow-up:  1. Discharge to residential hospice. May continue Dilaudid 1-2 mg every 1 hours IV as needed for severe pain per Dr. Benay Spice.  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 On 06/10/14, the patient developed acute onset of pain in his right second, third, and fourth toes. A duplex scan was done which showed clot in the deep femoral artery on the right as well as the mid femoral artery. He was transferred to [facility] and underwent a right femoral embolectomy on the same day. The patient was cleared by vascular surgery to return to [facility] to resume XRT on 06/11/14. After he returned to [facility], he developed worsening pain and a CT angiogram demonstrated SFA and popliteal occlusion requiring an urgent right thrombectomy (transferred back to [facility] for this). Developed signs of reocclusion on 06/13/14, but no further embolectomy or amputation recommended by vascular surgery given his overall poor prognosis.   Assessment and Plan:   Principal Problem:  Lung cancer Metastatic primary lung  cancer with chest pain /cancer of anterior mediastinum  Followed by Dr. Benay Spice, completed 2 cycles of chemotherapy and declined further treatment.  Restaging CT of the chest 05/12/14 revealed mild progression of disease.  Per oncology recommendation for residential hospice; appreciate social work assisting discharge planning. Seen by Dr. Lisbeth Renshaw of radiation oncology 06/04/14 with initiation of his first radiation treatment on 06/07/14, 30 gray in 10 fractions planned, which was resumed 7/6 and will be stopped today 06/17/2014.  Active Problems:  Acute respiratory failure with hypoxia /suspected community-acquired pneumonia  Initially felt to be secondary to community-acquired pneumonia, with initial chest radiograph showing increasing bilateral perihilar mass versus infiltration with a suggestion of focal developing infiltration in the left lung base. Infiltrates felt to represent spread of tumor by Dr. Benay Spice.  Treated with Rocephin/azithromycin x7 days as well as supplemental oxygen support. Protein-calorie malnutrition, severe / Underweight  Pt met criteria for severe malnutrition in the context of chronic illness as evidenced by PO intake <75% for one month, severe muscle wasting and subcutaneous fat loss, 7.4% body weight loss in one month.  Seen by dietitian 06/03/14. Continue nutritional supplements. Anemia of chronic disease   Secondary to history of malignancy. Ischemia of toe/Embolism, arterial, leg, right / acquired thrombophilia secondary malignancy  On 06/10/14, the patient developed acute onset of pain in his right second, third, and fourth toes. A duplex scan was done which showed clot in the deep femoral artery on the right as well as the mid femoral artery.  Transferred to [facility] and underwent a right femoral embolectomy 06/10/14.  Underwent further embolectomy of multiple  vessels along with a 4 compartment fasciotomy 06/12/14 for recurrent symptoms.  Initially treated with heparin  postoperatively, now on treatment dose Lovenox.  Developed symptoms of reocclusion 06/23/14 but is not a candidate for further surgical intervention for vascular surgery notes. Comfort care/palliation recommended.  CK elevated and could be contributing to LE pain. CK elevated at 6402. 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 Sodium ranges 125 to 126 DVT Prophylaxis  Continue Lovenox 60 mg Q 12 hours    Code Status: DNR/DNI  Family Communication: family not at the bedside   IV Access:   Peripheral IV Procedures:   ABIs 06/10/14: Right 1.02, left 1.05.  Arterial duplex 06/10/14: Positive for clot in the deep femoral artery and superficial femoral artery on the right.  Right femoral embolectomy, intraoperative arteriogram, vein patch angioplasty of the right common femoral artery 06/10/14 by Dr. Scot Dock.  Right superficial and profunda femoral artery thromboembolectomy, right popliteal thromboembolectomy, right anterior tibial artery and posterior tibial artery thromboembolectomy, 4 compartment fasciotomy by Dr. Bridgett Larsson 06/12/14. Medical Consultants:   Dr. Julieanne Manson, Oncology.  Dr. Marye Round, XRT.  Dr. Deitra Mayo and Dr. Adele Barthel, Vascular Surgery. Other Consultants:   Physical therapy: SNF recommended.  Occupational therapy: No OT followup recommended.  Dietitian Anti-Infectives:   Azithromycin 06/02/14---> 06/10/14  Rocephin 06/03/14---> 06/10/14  Levaquin 06/10/14---> 06/12/14  Cefuroxime 06/10/14---> 06/12/14     Signed:  Leisa Lenz, MD  [facility] 06/17/2014, 9:06 AM  Pager #: [phone]   Discharge Exam: Filed Vitals:   06/17/14 0416  BP: 96/50  Pulse: 119  Temp: 99.2 F (37.3 C)  Resp: 22   Filed Vitals:   06/16/14 1416 06/16/14 1613 06/16/14 2041 06/17/14 0416  BP: 103/47 101/53 129/60 96/50  Pulse: 124 114 114 119  Temp: 97.9 F (36.6 C)  98.3 F (36.8 C) 99.2 F (37.3 C)  TempSrc: Oral  Oral Oral  Resp: _0 Height:      Weight:       SpO2: 91% 90% 91% 93%    Exam:  General: Pt is not in acute distress Cardiovascular: Regular rate and rhythm, S1/S2 appreciated Respiratory: Clear to auscultation bilaterally, no wheezing and no rhonchi Abdomen: Soft, non tender, non distended, bowel sounds present   Discharge Instructions  Discharge Instructions   Call MD for:  difficulty breathing, headache or visual disturbances    Complete by:  As directed      Call MD for:  persistant dizziness or light-headedness    Complete by:  As directed      Call MD for:  persistant nausea and vomiting    Complete by:  As directed      Call MD for:  severe uncontrolled pain    Complete by:  As directed      Diet - low sodium heart healthy    Complete by:  As directed      Discharge instructions    Complete by:  As directed   1. Please continue current pain medication regimen including Dilaudid 1-2 mg every one hour as needed IV per Dr. Benay Spice.     Increase activity slowly    Complete by:  As directed             Medication List    STOP taking these medications       dexamethasone 4 MG tablet  Commonly known as:  DECADRON     ibuprofen 600 MG tablet  Commonly known as:  ADVIL,MOTRIN  oxyCODONE-acetaminophen 5-325 MG per tablet  Commonly known as:  PERCOCET/ROXICET     prochlorperazine 10 MG tablet  Commonly known as:  COMPAZINE      TAKE these medications       acetaminophen 325 MG tablet  Commonly known as:  TYLENOL  Take 1-2 tablets (325-650 mg total) by mouth every 4 (four) hours as needed for mild pain (headache, or temp >/= 101 F).     ALPRAZolam 1 MG tablet  Commonly known as:  XANAX  Take 1 tablet (1 mg total) by mouth at bedtime.     alum & mag hydroxide-simeth 200-200-20 MG/5ML suspension  Commonly known as:  MAALOX/MYLANTA  Take 15-30 mLs by mouth every 2 (two) hours as needed for indigestion.     diazepam 5 MG/ML injection  Commonly known as:  VALIUM  Inject 1 mL (5 mg total) into the vein  every 4 (four) hours as needed.     DSS 100 MG Caps  Take 100 mg by mouth 2 (two) times daily.     enoxaparin 60 MG/0.6ML injection  Commonly known as:  LOVENOX  Inject 0.6 mLs (60 mg total) into the skin every 12 (twelve) hours.     feeding supplement (ENSURE COMPLETE) Liqd  Take 237 mLs by mouth 3 (three) times daily between meals.     folic acid 1 MG tablet  Commonly known as:  FOLVITE  Take 1 tablet (1 mg total) by mouth daily.     guaiFENesin-dextromethorphan 100-10 MG/5ML syrup  Commonly known as:  ROBITUSSIN DM  Take 5 mLs by mouth every 4 (four) hours as needed for cough.     HYDROmorphone 1 MG/ML Soln injection  Commonly known as:  DILAUDID  Inject 1-2 mLs (1-2 mg total) into the vein every hour as needed for severe pain.     Ipratropium-Albuterol 20-100 MCG/ACT Aers respimat  Commonly known as:  COMBIVENT  Inhale 1 puff into the lungs every 6 (six) hours as needed for wheezing or shortness of breath.     ondansetron 8 MG tablet  Commonly known as:  ZOFRAN  Take 1 tablet (8 mg total) by mouth every 8 (eight) hours as needed for nausea or vomiting.     OxyCODONE 40 mg T12a 12 hr tablet  Commonly known as:  OXYCONTIN  Take 1 tablet (40 mg total) by mouth every 12 (twelve) hours.     oxyCODONE 20 MG/ML concentrated solution  Commonly known as:  ROXICODONE INTENSOL  Take 0.3 mLs (6 mg total) by mouth every 2 (two) hours as needed for moderate pain or breakthrough pain.     pantoprazole 40 MG tablet  Commonly known as:  PROTONIX  Take 1 tablet (40 mg total) by mouth daily.     phenol 1.4 % Liqd  Commonly known as:  CHLORASEPTIC  Use as directed 1 spray in the mouth or throat as needed for throat irritation / pain.     thiamine 100 MG tablet  Take 1 tablet (100 mg total) by mouth daily.           Follow-up Information   Follow up with DICKSON,CHRISTOPHER S, MD In 2 weeks. (Office will call you to arrange your appt (sent))    Specialty:  Vascular Surgery    Contact information:   [place] [address] [phone]        The results of significant diagnostics from this hospitalization (including imaging, microbiology, ancillary and laboratory) are listed below for reference.  Significant Diagnostic Studies: Ct Chest Wo Contrast  06/04/2014   ADDENDUM REPORT: 06/04/2014 14:11  ADDENDUM: Dr. Benay Spice stopped by and reported that this patient is having severe, pleuritic left paramidline anterior chest wall pain. Careful review of this exam does not show definite pericardial or left anterior parasternal soft tissue disease. However, assessment is hindered by the lack of intravenous contrast material. When reviewing the study from 05/25/2014 performed with intravenous contrast, there is a subtle 16 mm soft tissue nodule seen just anterior to the right ventricle (see image 81 of series 4 from the 05/25/2014 CT of the chest) consistent with pericardial disease. This would be consistent with a metastatic deposit and a likely etiology for the patient's chest pain as Dr. Benay Spice states that this correlates directly with the location of the patient's symptoms.   Electronically Signed   By: Misty Stanley M.D.   On: 06/04/2014 14:11   06/04/2014   CLINICAL DATA:  Pneumonia versus progressive lung cancer.  EXAM: CT CHEST WITHOUT CONTRAST  TECHNIQUE: Multidetector CT imaging of the chest was performed following the standard protocol without IV contrast.  COMPARISON:  05/25/2014  FINDINGS: Examination is technically limited for evaluation of hilar and mediastinal structures without IV contrast material.  Cardiac enlargement. Normal caliber thoracic aorta. Probable vascular congestion. Bilateral hilar and probable mediastinal lymphadenopathy, better visualized on previous contrast enhanced study. Airways remain patent although there is evidence of mucous in distal trachea and mild narrowing of the left lower lobe bronchus probably due to extrinsic  compression. Left suprahilar mass lesion again demonstrated, measuring 3.2 bone 1.9 cm, similar to prior study. Diffuse emphysematous changes in the lungs. Increasing atelectasis or consolidation in the lung bases. No pneumothorax or effusion. Visualized portions of the upper abdominal organs are grossly unremarkable.  IMPRESSION: No definitive change in appearance of left suprahilar mass and bilateral hilar and mediastinal lymphadenopathy although evaluation is limited without contrast material. There appears to be increasing infiltration or atelectasis in the lung bases. Diffuse emphysematous changes.  Electronically Signed: By: Lucienne Capers M.D. On: 06/03/2014 02:47   Ct Angio Chest Pe W/cm &/or Wo Cm  05/25/2014   CLINICAL DATA:  Chest pain.  Shortness of breath.  Chest wall mass.  EXAM: CT ANGIOGRAPHY CHEST WITH CONTRAST  TECHNIQUE: Multidetector CT imaging of the chest was performed using the standard protocol during bolus administration of intravenous contrast. Multiplanar CT image reconstructions and MIPs were obtained to evaluate the vascular anatomy.  CONTRAST:  152m OMNIPAQUE IOHEXOL 350 MG/ML SOLN  COMPARISON:  05/12/2014.  FINDINGS: Technically adequate study without pulmonary embolus. No acute aortic abnormality. Cardiomegaly is present. Lungs demonstrate bulky bilateral hilar adenopathy and mediastinal adenopathy, compatible with malignant adenopathy. Left upper lobe mass has increased slightly in size, measuring 33 mm AP by 20 mm transverse. Severe emphysema is present. No airspace disease/pneumonia. Prominent dependent atelectasis.  The hilar adenopathy appears to of increased in the short interval since the prior exam, with right hilar node today measuring 26 mm, previously 23 mm. Some of this probably has a do with slice selection. Left hilar node appears similar. Radiodense object present in the left upper quadrant of the abdomen which potentially represents contents in the enteric  stream.  Review of the MIP images confirms the above findings.  IMPRESSION: 1. No pulmonary embolus or acute aortic abnormality. 2. Mild progression of higher and mediastinal adenopathy along with left upper lobe pulmonary mass measuring 33 mm x 20 mm.   Electronically Signed   By:  Dereck Ligas M.D.   On: 05/25/2014 22:39   Ct Angio Ao+bifem W/cm &/or Wo/cm  06/11/2014   CLINICAL DATA:  History of right common femoral artery embolus. Recurrence of severe right leg pain.  EXAM: CT ANGIOGRAPHY OF ABDOMINAL AORTA WITH ILIOFEMORAL RUNOFF  TECHNIQUE: Multidetector CT imaging of the abdomen, pelvis and lower extremities was performed using the standard protocol during bolus administration of intravenous contrast. Multiplanar CT image reconstructions and MIPs were obtained to evaluate the vascular anatomy.  CONTRAST:  170m OMNIPAQUE IOHEXOL 350 MG/ML SOLN  COMPARISON:  CT chest 06/03/2014. CT chest abdomen and pelvis 05/12/2014.  FINDINGS: Aorta: Normal caliber. Infrarenal calcifications. Mesenteric vessels and bilateral single renal arteries are patent.  Right Lower Extremity: There is gas present within the subcutaneous soft tissues of the right growing, presumably related to recent vascular surgery. There is complete occlusion of the right superficial femoral artery just beyond the takeoff of the profunda femoris. No flow noted within the superficial femoral artery, popliteal artery or trifurcation vessels of the right calf.  Left Lower Extremity: Atherosclerotic calcifications in the common iliac artery without stenosis or aneurysm. Left common femoral artery, superficial femoral artery, popliteal artery, and trifurcation vessels and the left calf are unremarkable.  Abdomen/pelvis: Vascular congestion and bibasilar airspace opacities, likely atelectasis or consolidation, similar to prior study. No pleural effusions.  10 mm low-density lesion within the right hepatic lobe on image 71 of series 5 is stable since  prior study. Gallbladder, spleen, pancreas, adrenals and kidneys are normal.  Urinary bladder, stomach, large and small bowel grossly unremarkable. No free fluid, free air or adenopathy.  Review of the MIP images confirms the above findings.  IMPRESSION: Complete occlusion of the right superficial femoral artery, with abrupt contrast cut off just beyond the profunda femoris takeoff. Remainder of the right lower extremity arterial system is unopacified.  Continued bilateral lower lobe airspace opacities, left greater than right, atelectasis versus consolidation.  Stable small right hepatic lesion, nonspecific.  Critical Value/emergent results were called by telephone at the time of interpretation on 06/11/2014 at 8:01 PM to Dr. BAdele Barthel, who verbally acknowledged these results.   Electronically Signed   By: KRolm BaptiseM.D.   On: 06/11/2014 20:01   Dg Ang/ext/uni/or Right  06/10/2014   CLINICAL DATA:  Right femoral embolus.  EXAM: RIGHT ANG/EXT/UNI/ OR  TECHNIQUE: Intraoperative image  CONTRAST:  See operative record.  FLUOROSCOPY TIME:  See operative record.  COMPARISON:  Pelvic CT 05/12/2014  FINDINGS: Single angiographic image of the distal femur and proximal calf were obtained. The distal superficial femoral artery and the popliteal artery are patent. There is three-vessel runoff in the proximal calf. There is no contrast within the mid and distal calf and most likely related to the timing of the image.  IMPRESSION: The distal superficial femoral artery, popliteal artery and proximal calf vessels are patent.   Electronically Signed   By: AMarkus DaftM.D.   On: 06/10/2014 18:23   Dg Chest Port 1 View  06/02/2014   CLINICAL DATA:  Left-sided chest and arm pain for 1 week. Wrist prior rotatory distress.  EXAM: PORTABLE CHEST - 1 VIEW  COMPARISON:  05/25/2014  FINDINGS: Heart size is normal. Normal pulmonary vascularity. Increasing perihilar mass or infiltration bilaterally. This corresponds to perihilar  lymphadenopathy noted on previous CT chest 05/25/2014. Stable interstitial changes in the lungs with suggestion of focal increasing opacity in the left lung base. Developing pneumonia is not excluded. No blunting of  costophrenic angles. No pneumothorax.  IMPRESSION: Increasing bilateral perihilar mass or infiltration with suggestion of focal developing infiltration in the left lung base.   Electronically Signed   By: Lucienne Capers M.D.   On: 06/02/2014 22:36   Dg Chest Port 1 View  05/25/2014   CLINICAL DATA:  58 year old male with chest pain shortness of breath fever cough and weakness. Initial encounter. Metastatic adenocarcinoma.  EXAM: PORTABLE CHEST - 1 VIEW  COMPARISON:  Chest CTA 05/12/2014.  FINDINGS: Portable AP upright view at 2054 hrs. Stable cardiac size and mediastinal contours. Visualized tracheal air column is within normal limits. No pneumothorax, pulmonary edema, or pleural effusion. There is increased streaky left lung base opacity which may be postobstructive in light of the recent CT.  IMPRESSION: Mildly increased streaky left lung base opacity, suspicious for postobstructive infection in light of the recent chest CT findings an the clinical presentation.   Electronically Signed   By: Lars Pinks M.D.   On: 05/25/2014 21:26    Microbiology: No results found for this or any previous visit (from the past 240 hour(s)).   Labs: Basic Metabolic Panel:  Recent Labs Lab 06/10/14 1925 06/11/14 0319 06/12/14 0250 06/12/14 0934 06/15/14 0442 06/16/14 0428  NA  --  131* 134*  --  126* 125*  K  --  4.0 5.6* 4.1 4.1 4.0  CL  --  93* 95*  --  91* 90*  CO2  --  24 26  --  22 25  GLUCOSE  --  101* 138*  --  100* 108*  BUN  --  9 8  --  10 10  CREATININE 0.54 0.58 0.58  --  0.40* 0.50  CALCIUM  --  8.8 9.1  --  8.4 8.3*   Liver Function Tests:  Recent Labs Lab 06/11/14 0319 06/12/14 0250  AST 14 19  ALT 7 8  ALKPHOS 75 69  BILITOT 0.3 0.2*  PROT 6.8 6.6  ALBUMIN 2.6*  2.7*   No results found for this basename: LIPASE, AMYLASE,  in the last 168 hours No results found for this basename: AMMONIA,  in the last 168 hours CBC:  Recent Labs Lab 06/12/14 0250 06/13/14 0244 06/15/14 0442 06/16/14 0428 06/17/14 0500  WBC 7.0 4.9 6.4 6.9 7.4  HGB 11.6* 11.0* 11.6* 11.6* 10.4*  HCT 34.0* 31.8* 33.2* 32.2* 29.7*  MCV 91.2 90.3 89.7 88.2 90.0  PLT 271 316 285 278 248   Cardiac Enzymes:  Recent Labs Lab 06/15/14 0442 06/16/14 0428  CKTOTAL 6402* 5971*   BNP: BNP (last 3 results)  Recent Labs  05/25/14 2127 06/02/14 2211  PROBNP 109.9 144.3*   CBG: No results found for this basename: GLUCAP,  in the last 168 hours  Time coordinating discharge: Over 30 minutes

## 2014-06-17 NOTE — Progress Notes (Addendum)
Pt for discharge to [facility].  CSW facilitated pt discharge needs including discussing with [facility] liaison, Erling Conte who confirmed that she faxed d/c summary to [facility], discussing with pt at bedside, providing RN phone number to call report to [facility], and arranging ambulance transport for pt to [facility]. (Service Request ID#: [id number]).  Pt coping appropriately with transition to [facility]. CSW clarified pt questions regarding seeing the MD at [facility] and transportation to [facility]. Per Erling Conte, Erling Conte contacted pt son this morning regarding pt discharge to [facility].   No further social work needs identified at this time.  CSW signing off.   Alison Murray, MSW, [place] Work [phone]

## 2014-06-17 NOTE — Progress Notes (Signed)
Report has already called to [facility]. Transport/ [facility] is here to tx pt to the facility. Pt is alert and oriented but is still very forgetful and has not yet grasped the true reality and extent of medical state/condition. Pt medicated for pain, staples in his RLE remain for now, pt was sent with his current PIV so that he could continue his IV pain meds (dilaudid). Other than his surgical incisions all other skin is intact, VSS, no apparent distress or discomfort at this time. The NT packed all of his personal belongings and valuables and these were given to [facility] staff for transport. Pt DC'd.

## 2014-06-17 NOTE — Progress Notes (Signed)
[facility] Therapy Treatment Record Phone [phone]   Radiation Therapy was administered to Philip Andrews on: 06/17/2014  8:44 AM and was treatment # 7 out of a planned course of 8 treatments.

## 2014-06-17 NOTE — Progress Notes (Signed)
PT Cancellation Note /Screen  Patient Details Name: Philip Andrews MRN: [id number] DOB: January 08, 1956   Cancelled Treatment:    Reason Eval/Treat Not Completed: PT screened, no needs identified, will sign off Pt to d/c to [facility] today.   [name],KATHrine E 06/17/2014, 10:36 AM

## 2014-06-17 NOTE — Discharge Instructions (Signed)
Hospice °Hospice is a service that is designed to provide people who are terminally ill and their families with medical, spiritual, and psychological support. Its aim is to improve your quality of life by keeping you as alert and comfortable as possible. Hospice is performed by a team of health care professionals and volunteers who: °· Help keep you comfortable. Hospice can be provided in your home or in a homelike setting. The hospice staff works with your family and friends to help meet your needs. You will enjoy the support of loved ones by receiving much of your basic care from family and friends. °· Provide pain relief and manage your symptoms. The staff supply all necessary medicines and equipment. °· Provide companionship when you are alone. °· Allow you and your family to rest. They may do light housekeeping, prepare meals, and run errands. °· Provide counseling. They will make sure your emotional, spiritual, and social needs and those of your family are being met. °· Provide spiritual care. Spiritual care is individualized to meet your needs and your family's needs. It may involve helping you look at what death means to you, say goodbye, or perform a specific religious ceremony or ritual. °Hospice teams often include: °· A nurse. °· A doctor. °· Social workers. °· Religious leaders (such as a chaplain). °· Trained volunteers. °WHEN SHOULD HOSPICE CARE BEGIN? °Most people who use hospice are believed to have fewer than 6 months to live. Your family and health care providers can help you decide when hospice services should begin. If your condition improves, you may discontinue the program. °WHAT SHOULD I CONSIDER BEFORE SELECTING A PROGRAM? °Most hospice programs are run by nonprofit, independent organizations. Some are affiliated with hospitals, nursing homes, or home health care agencies. Hospice programs can take place in the home or at a hospice center, hospital, or skilled nursing facility. When choosing  a hospice program, ask the following questions: °· What services are available to me? °· What services are offered to my loved ones? °· How involved are my loved ones? °· How involved is my health care provider? °· Who makes up the hospice care team? How are they trained or screened? °· How will my pain and symptoms be managed? °· If my circumstances change, can the services be provided in a different setting, such as my home or in the hospital? °· Is the program reviewed and licensed by the state or certified in some other way? °WHERE CAN I LEARN MORE ABOUT HOSPICE? °You can learn about existing hospice programs in your area from your health care providers. You can also read more about hospice online. The websites of the following organizations contain helpful information: °· The National Hospice and Palliative Care Organization (NHPCO). °· The Hospice Association of America (HAA). °· The Hospice Education Institute. °· The American Cancer Society (ACS). °· Hospice Net. °Document Released: 03/14/2004 Document Revised: 12/01/2013 Document Reviewed: 10/06/2013 °ExitCare® Patient Information ©2015 [employer]. This information is not intended to replace advice given to you by your health care provider. Make sure you discuss any questions you have with your health care provider. ° °

## 2014-06-17 NOTE — Progress Notes (Signed)
Patient Philip Andrews      DOB: [birth year]      WKM:[id number]  Noted patient agreeable to transition to [facility]. Patient reported that he slept a lot last night. Patient only used 9 mg dilaudid in last 24 hours.  Will leave some oxyir liquid to transition with him on discharge for breakthrough pain med.  Recommend:  1.  DNR 2.  Pain: continue OxyContin q 12 . Discharge with oxycodone 5 can be titrated to 10 mg q hrs prn, dispense 30 ml.  Taim Wurm L. Lovena Le, MD MBA The Palliative Medicine Team at [facility] Phone: [phone] Pager: [phone]

## 2014-06-18 ENCOUNTER — Encounter: Payer: Medicaid Other | Admitting: Radiation Oncology

## 2014-06-18 ENCOUNTER — Ambulatory Visit: Payer: Medicaid Other

## 2014-06-21 ENCOUNTER — Ambulatory Visit: Payer: Medicaid Other

## 2014-06-21 ENCOUNTER — Encounter: Payer: Self-pay | Admitting: Radiation Oncology

## 2014-06-23 ENCOUNTER — Encounter: Payer: Medicaid Other | Admitting: Vascular Surgery

## 2014-06-23 ENCOUNTER — Encounter: Payer: Self-pay | Admitting: Vascular Surgery

## 2014-06-24 ENCOUNTER — Encounter: Payer: Medicaid Other | Admitting: Vascular Surgery

## 2014-07-07 ENCOUNTER — Encounter: Payer: Medicaid Other | Admitting: Vascular Surgery

## 2014-07-10 DEATH — deceased

## 2014-07-12 NOTE — Progress Notes (Signed)
  Radiation Oncology         [phone] ________________________________  Name: Philip Andrews MRN: [id number]  Date: 06/21/2014  DOB: 1956/08/05  End of Treatment Note  Diagnosis:   Metastatic non-small cell lung cancer     Indication for treatment:  Palliative       Radiation treatment dates:   The patient received radiation treatments from 06/07/2014 further 06/17/2014  Site/dose:   Left anterior lung/mediastinum. This was planned to receive 30 gray in 10 fractions at 3 gray per fraction. After 3 treatments, the patient's dose per fraction was increased to 3.5 gray per fraction and he was planned to receive a total of 8 fractions.  Narrative: The patient tolerated radiation treatment relatively well.   The patient did not exhibit any difficulties with acute toxicity. He received a total of 7 fractions before discontinuing treatment due to decreased performance status.  Plan: Followup on a when necessary basis. ________________________________  Jodelle Gross, M.D., Ph.D.

## 2014-08-06 ENCOUNTER — Other Ambulatory Visit: Payer: Self-pay | Admitting: Pharmacist

## 2014-12-17 IMAGING — CR DG CHEST 1V PORT
3 series · 3 of 3 positions shown · non-contrast
Comparison: 07/03/2007

CLINICAL DATA: Intubation.  Line placement.

EXAM:
PORTABLE CHEST - 1 VIEW

[AP (1 of 3)]
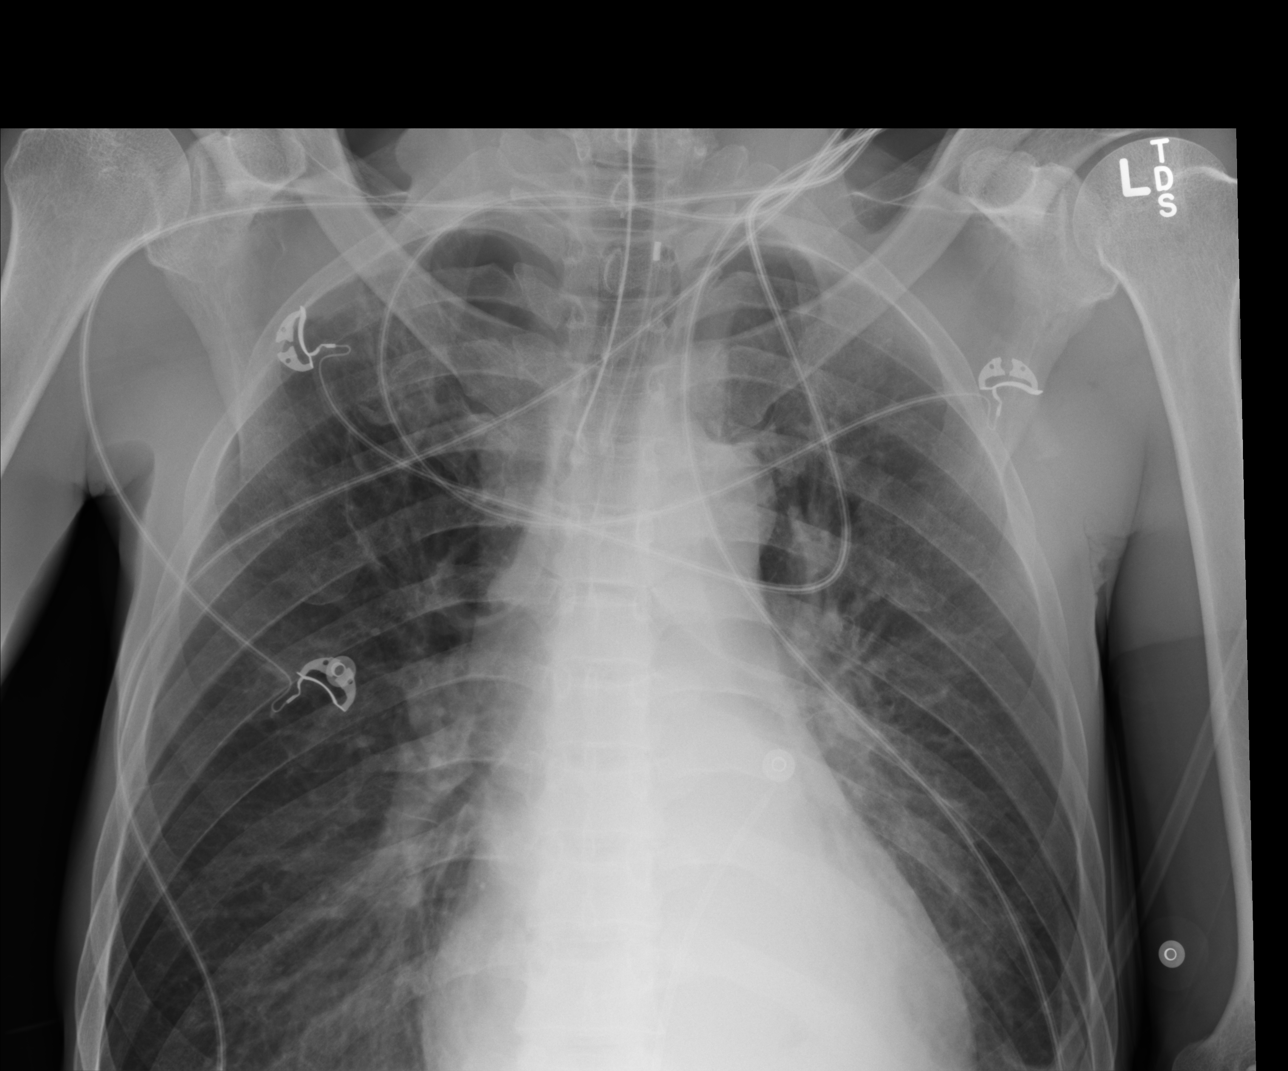

[AP (2 of 3)]
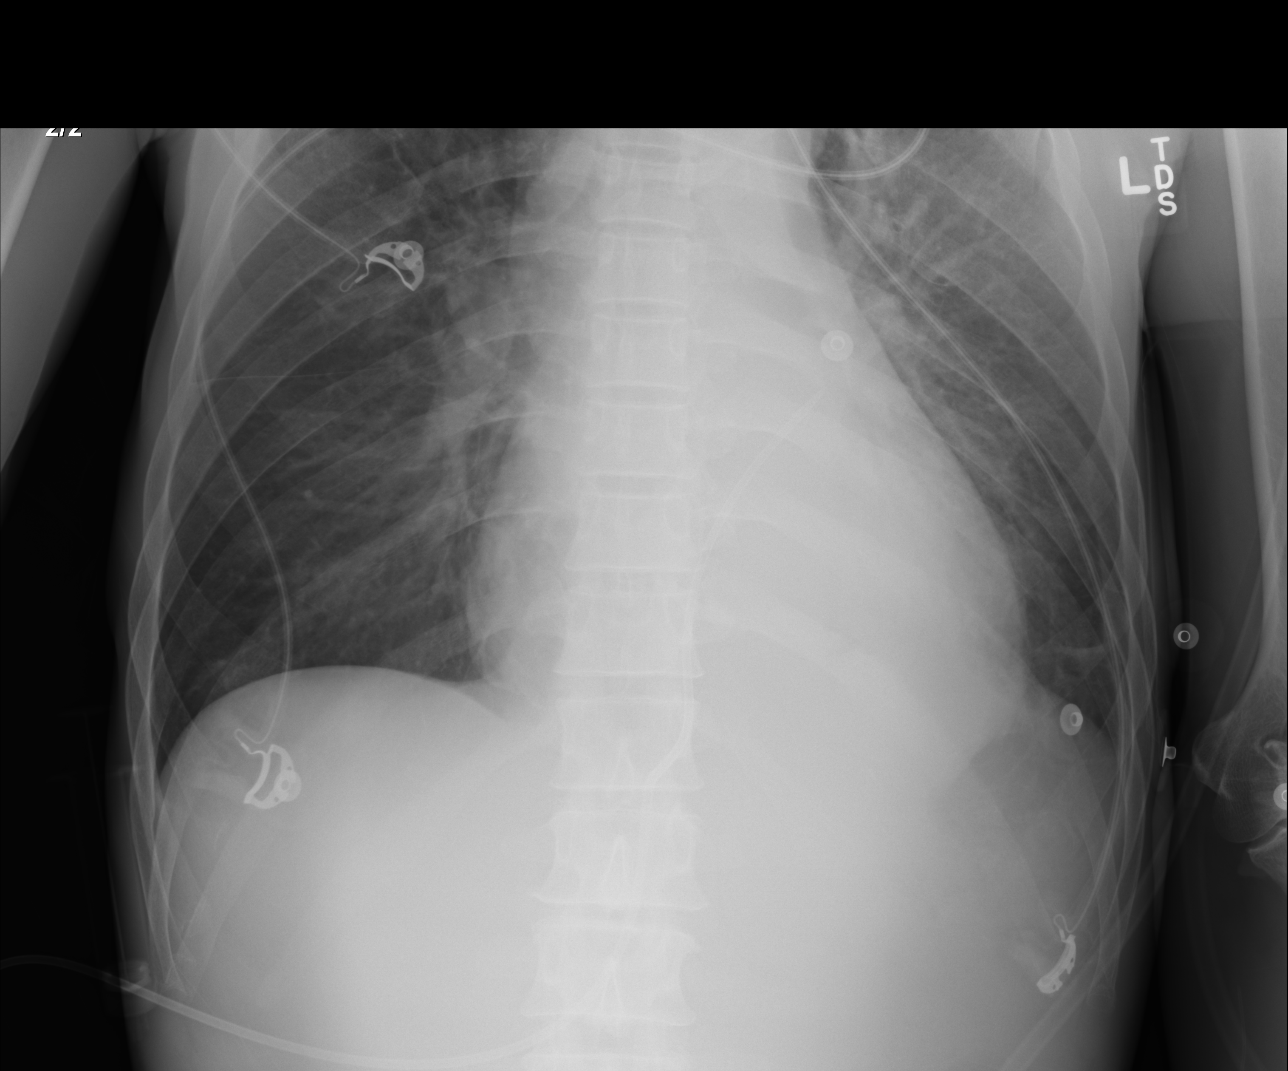

[AP (3 of 3)]
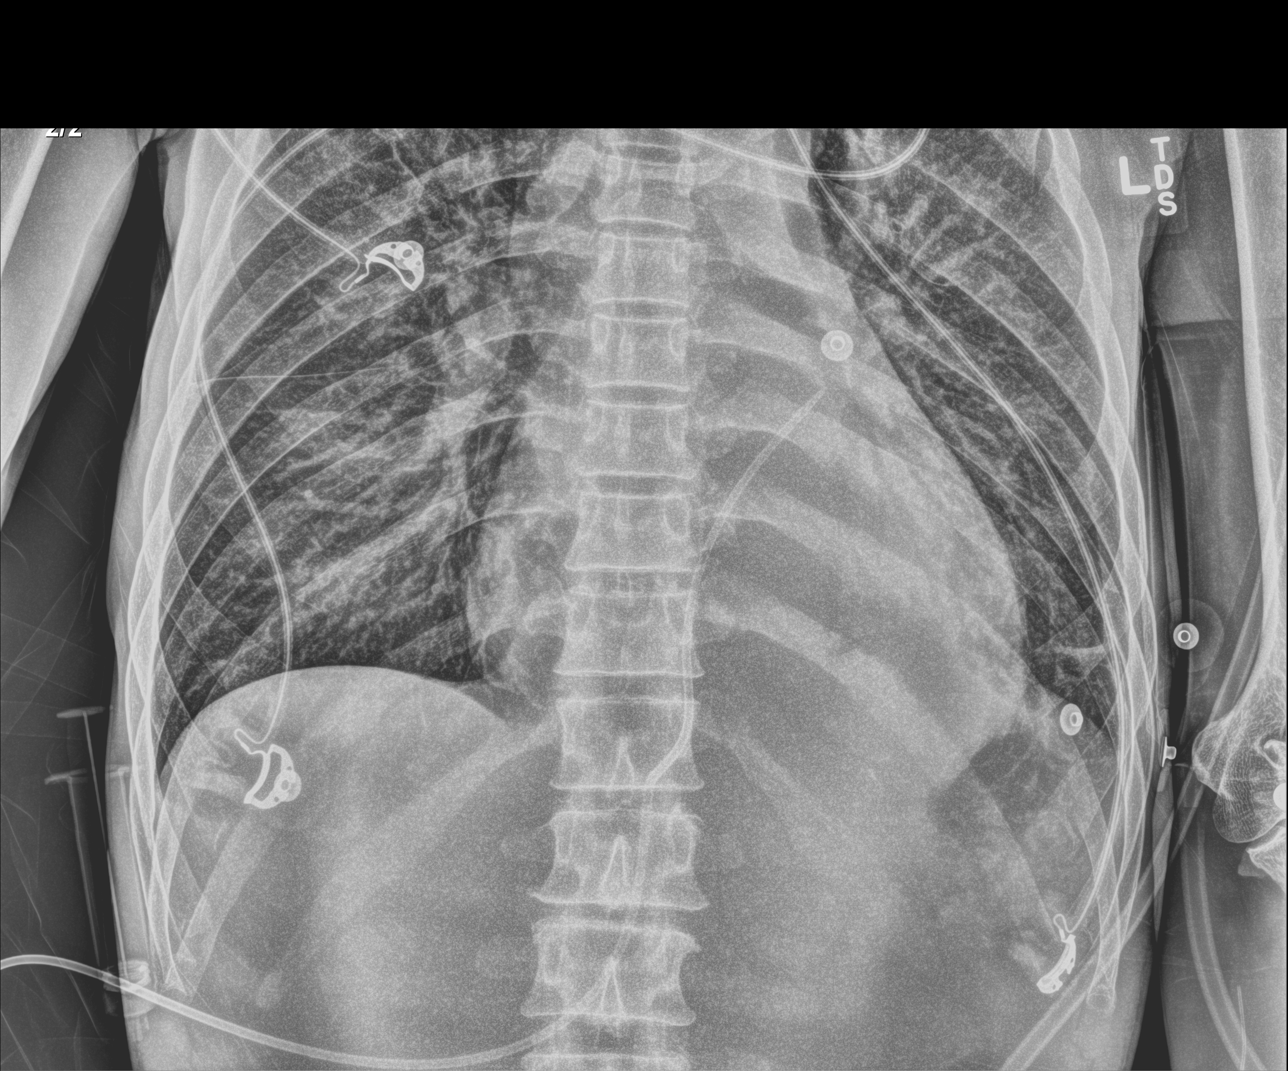

[3 of 3 positions shown; findings below may reference images not displayed]

FINDINGS: Endotracheal tube is in place, tip approximately 4.3 cm above
chronic. A catheter has an inferior approach, overlying the heart,
possibly representing pericardial catheter.

The heart is enlarged. There is dense opacification in the
retrocardiac region on the left, consistent with infiltrate or
atelectasis. There is perihilar edema.
IMPRESSION: 1. Interval placement of endotracheal tube and probable pericardial
catheter.
2. Cardiomegaly and pulmonary edema.
3. Left lower lobe infiltrate or atelectasis.

## 2015-07-27 IMAGING — CT CT CHEST W/O CM
2 of 4 series · 15 of 36 positions shown, 18 images · non-contrast
Comparison: 05/25/2014

ADDENDUM:
Dr. Ackaah stopped by and reported that this patient is having
severe, pleuritic left paramidline anterior chest wall pain. Careful
review of this exam does not show definite pericardial or left
anterior parasternal soft tissue disease. However, assessment is
hindered by the lack of intravenous contrast material. When
reviewing the study from 05/25/2014 performed with intravenous
contrast, there is a subtle 16 mm soft tissue nodule seen just
anterior to the right ventricle (see image 81 of series 4 from the
05/25/2014 CT of the chest) consistent with pericardial disease.
This would be consistent with a metastatic deposit and a likely
etiology for the patient's chest pain as Dr. Bongben Abatsong that
this correlates directly with the location of the patient's
symptoms.
CLINICAL DATA: Pneumonia versus progressive lung cancer.

EXAM:
CT CHEST WITHOUT CONTRAST
TECHNIQUE: Multidetector CT imaging of the chest was performed following the
standard protocol without IV contrast..

[Series 2: thorax 5.0 i31f 1 · axial · 0.69mm/px · z∈[+1067,+1392]mm · 12 of 77 slices shown, 15 images]
[im 6/77  mediastinal]
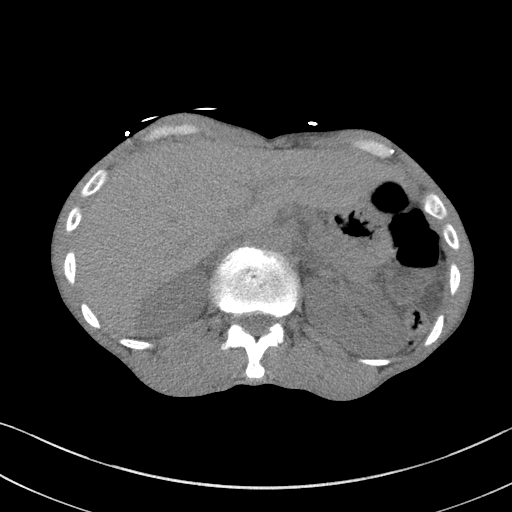
[im 6/77  lung]
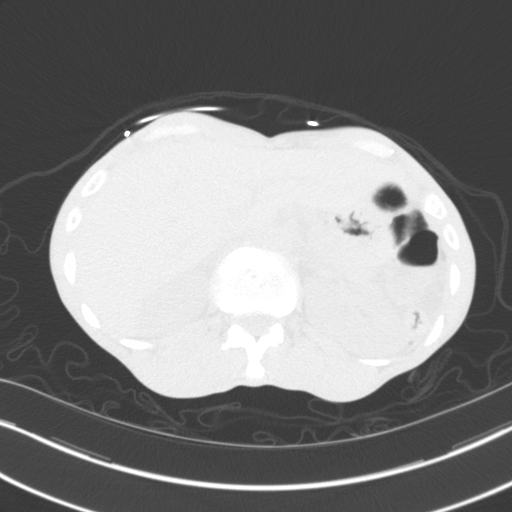
[im 12/77  lung]
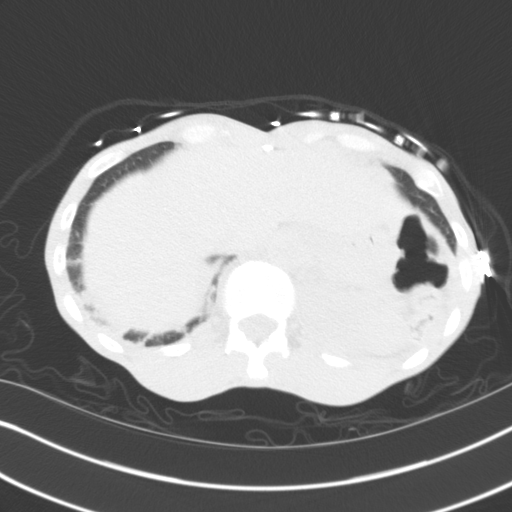
[im 18/77  lung]
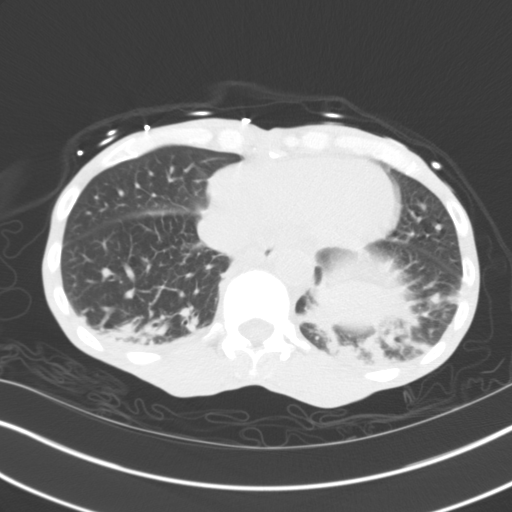
[im 24/77  lung]
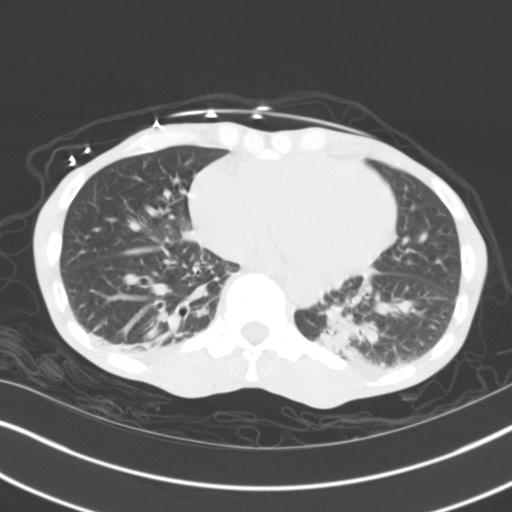
[im 30/77  mediastinal]
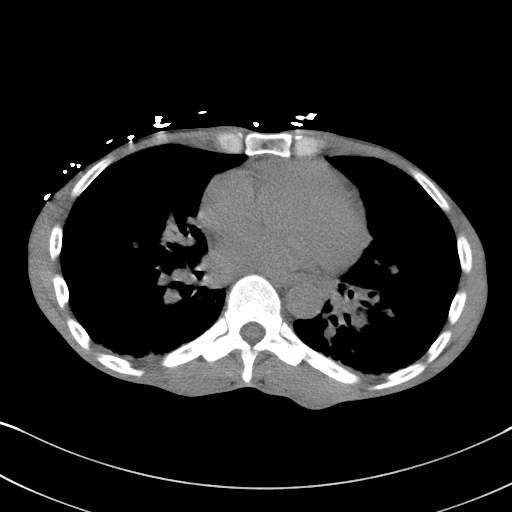
[im 30/77  lung]
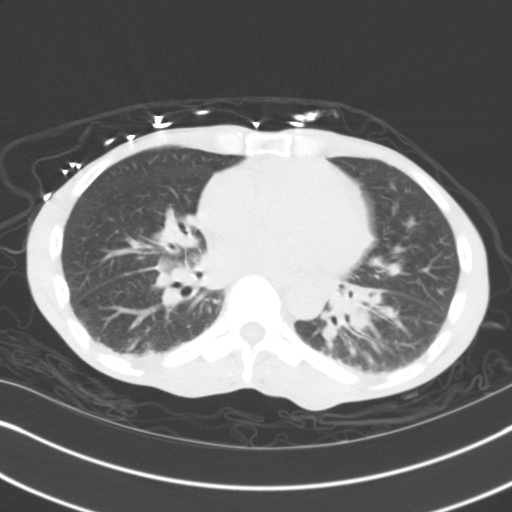
[im 36/77  lung]
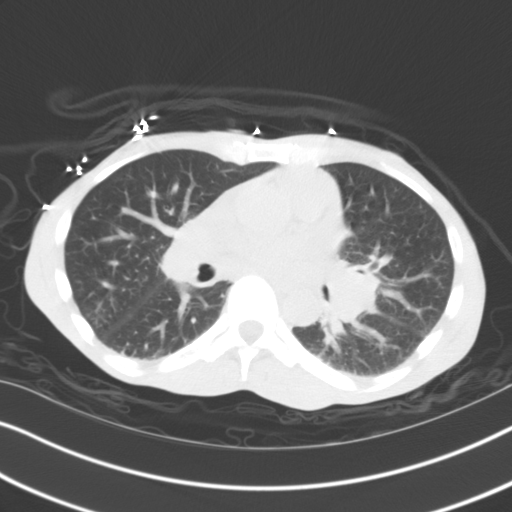
[im 41/77  lung]
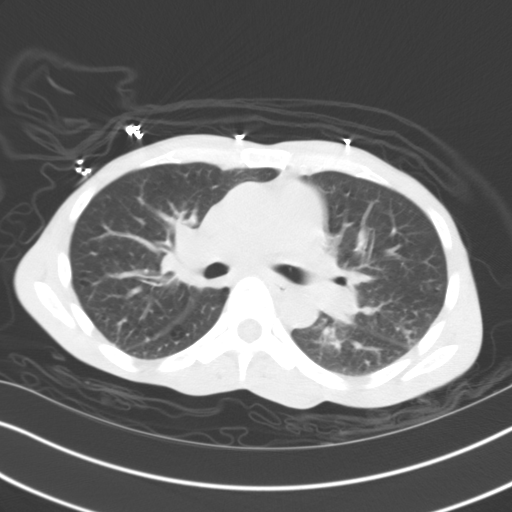
[im 47/77  lung]
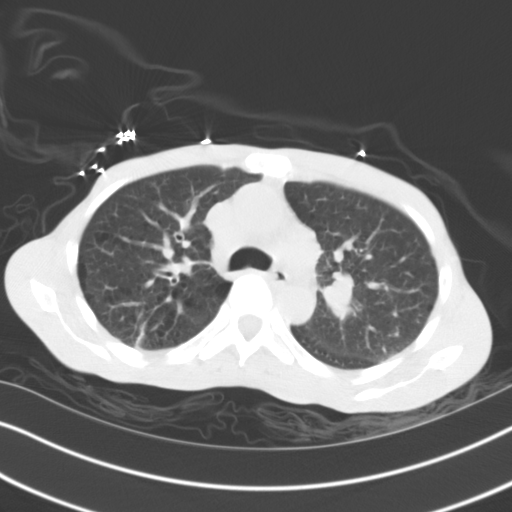
[im 53/77  mediastinal]
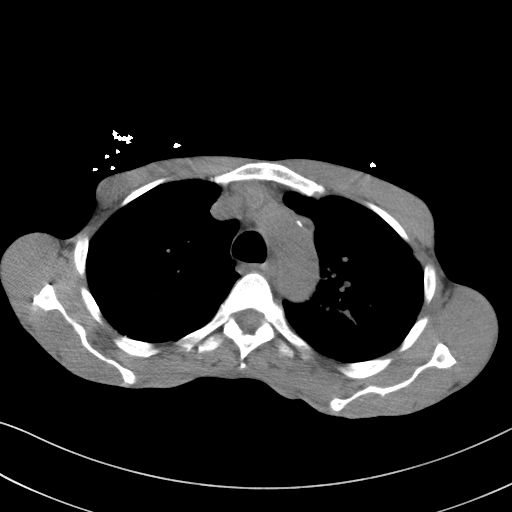
[im 53/77  lung]
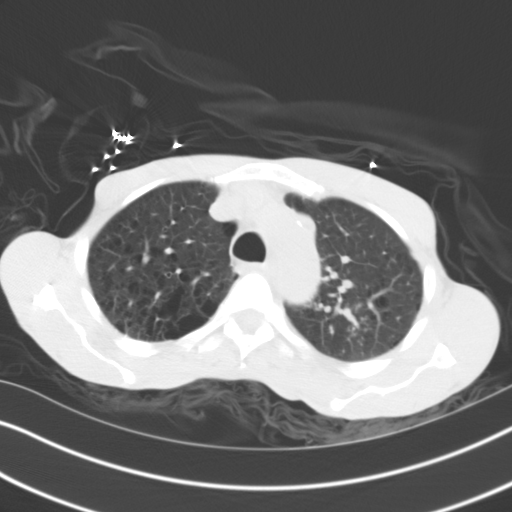
[im 59/77  lung]
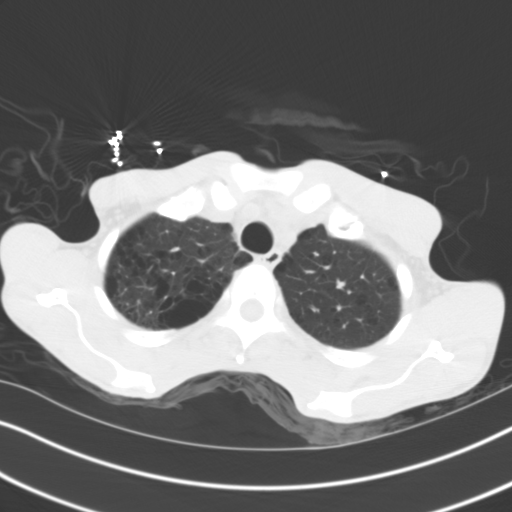
[im 65/77  lung]
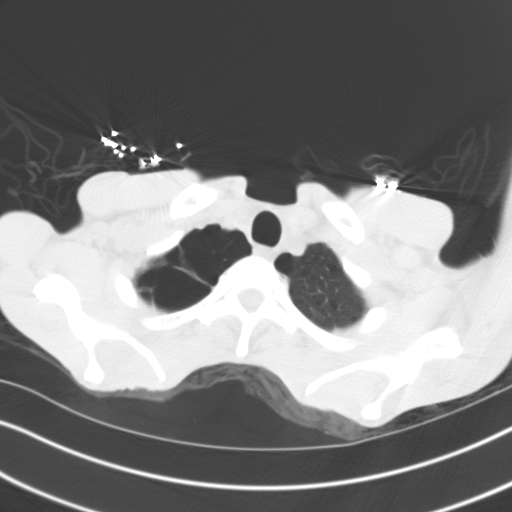
[im 71/77  lung]
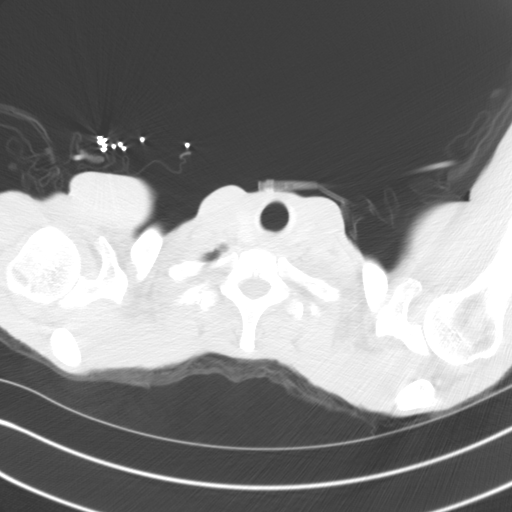

[Series 5: coronal · coronal · 0.74mm/px · 3 of 55 slices shown]
[im 11/55  lung]
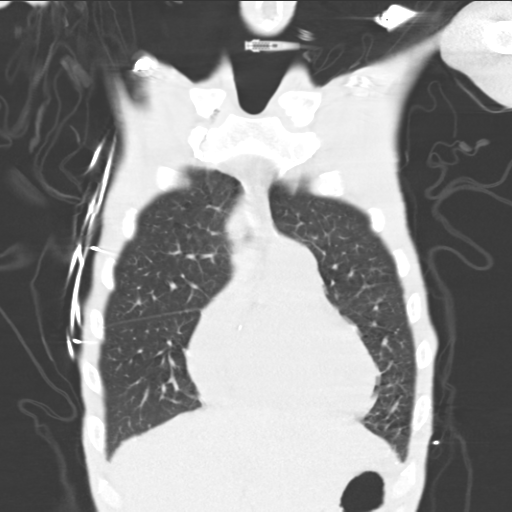
[im 22/55  lung]
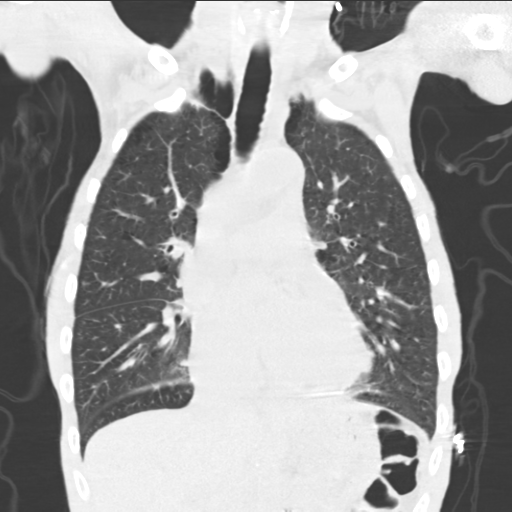
[im 33/55  lung]
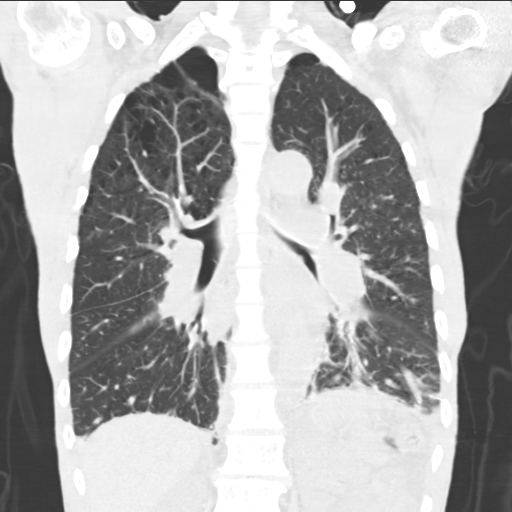

[15 of 36 positions shown; findings below may reference images not displayed]

FINDINGS: Examination is technically limited for evaluation of hilar and
mediastinal structures without IV contrast material.

Cardiac enlargement. Normal caliber thoracic aorta. Probable
vascular congestion. Bilateral hilar and probable mediastinal
lymphadenopathy, better visualized on previous contrast enhanced
study. Airways remain patent although there is evidence of mucous in
distal trachea and mild narrowing of the left lower lobe bronchus
probably due to extrinsic compression. Left suprahilar mass lesion
again demonstrated, measuring 3.2 bone 1.9 cm, similar to prior
study. Diffuse emphysematous changes in the lungs. Increasing
atelectasis or consolidation in the lung bases. No pneumothorax or
effusion. Visualized portions of the upper abdominal organs are
grossly unremarkable.
IMPRESSION: No definitive change in appearance of left suprahilar mass and
bilateral hilar and mediastinal lymphadenopathy although evaluation
is limited without contrast material. There appears to be increasing
infiltration or atelectasis in the lung bases. Diffuse emphysematous
changes.
